# Patient Record
Sex: Male | Born: 1975 | Race: White | Hispanic: No | Smoking: Former smoker
Health system: Southern US, Community
[De-identification: ages and names within clinical notes are randomized; demographics above are authoritative.]

## PROBLEM LIST (undated history)

## (undated) DIAGNOSIS — K7031 Alcoholic cirrhosis of liver with ascites: Secondary | ICD-10-CM

## (undated) DIAGNOSIS — I10 Essential (primary) hypertension: Secondary | ICD-10-CM

## (undated) DIAGNOSIS — F1010 Alcohol abuse, uncomplicated: Secondary | ICD-10-CM

## (undated) DIAGNOSIS — E8801 Alpha-1-antitrypsin deficiency: Secondary | ICD-10-CM

## (undated) DIAGNOSIS — I8500 Esophageal varices without bleeding: Secondary | ICD-10-CM

## (undated) DIAGNOSIS — K7460 Unspecified cirrhosis of liver: Secondary | ICD-10-CM

---

## 2021-04-12 ENCOUNTER — Emergency Department: Payer: 59

## 2021-04-12 ENCOUNTER — Encounter: Payer: Self-pay | Admitting: Emergency Medicine

## 2021-04-12 ENCOUNTER — Inpatient Hospital Stay
Admission: EM | Admit: 2021-04-12 | Discharge: 2021-04-15 | DRG: 432 | Disposition: A | Discharge status: Home / Self Care | Payer: 59 | Attending: Internal Medicine | Admitting: Internal Medicine

## 2021-04-12 ENCOUNTER — Other Ambulatory Visit: Payer: Self-pay

## 2021-04-12 DIAGNOSIS — Z823 Family history of stroke: Secondary | ICD-10-CM | POA: Diagnosis not present

## 2021-04-12 DIAGNOSIS — U071 COVID-19: Secondary | ICD-10-CM | POA: Diagnosis present

## 2021-04-12 DIAGNOSIS — I10 Essential (primary) hypertension: Secondary | ICD-10-CM | POA: Diagnosis present

## 2021-04-12 DIAGNOSIS — K769 Liver disease, unspecified: Secondary | ICD-10-CM | POA: Diagnosis not present

## 2021-04-12 DIAGNOSIS — K7031 Alcoholic cirrhosis of liver with ascites: Secondary | ICD-10-CM | POA: Diagnosis present

## 2021-04-12 DIAGNOSIS — R0989 Other specified symptoms and signs involving the circulatory and respiratory systems: Secondary | ICD-10-CM

## 2021-04-12 DIAGNOSIS — R140 Abdominal distension (gaseous): Secondary | ICD-10-CM | POA: Diagnosis present

## 2021-04-12 DIAGNOSIS — K7040 Alcoholic hepatic failure without coma: Secondary | ICD-10-CM | POA: Diagnosis present

## 2021-04-12 DIAGNOSIS — K7460 Unspecified cirrhosis of liver: Secondary | ICD-10-CM | POA: Diagnosis present

## 2021-04-12 DIAGNOSIS — K766 Portal hypertension: Secondary | ICD-10-CM | POA: Diagnosis present

## 2021-04-12 DIAGNOSIS — Z87891 Personal history of nicotine dependence: Secondary | ICD-10-CM

## 2021-04-12 DIAGNOSIS — Z8249 Family history of ischemic heart disease and other diseases of the circulatory system: Secondary | ICD-10-CM | POA: Diagnosis not present

## 2021-04-12 DIAGNOSIS — E561 Deficiency of vitamin K: Secondary | ICD-10-CM | POA: Diagnosis present

## 2021-04-12 DIAGNOSIS — R188 Other ascites: Secondary | ICD-10-CM

## 2021-04-12 DIAGNOSIS — E871 Hypo-osmolality and hyponatremia: Secondary | ICD-10-CM | POA: Diagnosis present

## 2021-04-12 DIAGNOSIS — K7200 Acute and subacute hepatic failure without coma: Secondary | ICD-10-CM | POA: Diagnosis not present

## 2021-04-12 DIAGNOSIS — R000 Tachycardia, unspecified: Secondary | ICD-10-CM | POA: Diagnosis present

## 2021-04-12 DIAGNOSIS — D72829 Elevated white blood cell count, unspecified: Secondary | ICD-10-CM | POA: Diagnosis present

## 2021-04-12 DIAGNOSIS — Z8616 Personal history of COVID-19: Secondary | ICD-10-CM

## 2021-04-12 DIAGNOSIS — E876 Hypokalemia: Secondary | ICD-10-CM | POA: Diagnosis present

## 2021-04-12 DIAGNOSIS — R160 Hepatomegaly, not elsewhere classified: Secondary | ICD-10-CM | POA: Diagnosis not present

## 2021-04-12 DIAGNOSIS — F1020 Alcohol dependence, uncomplicated: Secondary | ICD-10-CM | POA: Diagnosis present

## 2021-04-12 DIAGNOSIS — R7989 Other specified abnormal findings of blood chemistry: Secondary | ICD-10-CM | POA: Diagnosis present

## 2021-04-12 DIAGNOSIS — Z8619 Personal history of other infectious and parasitic diseases: Secondary | ICD-10-CM | POA: Diagnosis not present

## 2021-04-12 DIAGNOSIS — Z833 Family history of diabetes mellitus: Secondary | ICD-10-CM

## 2021-04-12 DIAGNOSIS — I160 Hypertensive urgency: Secondary | ICD-10-CM | POA: Diagnosis present

## 2021-04-12 DIAGNOSIS — D689 Coagulation defect, unspecified: Secondary | ICD-10-CM | POA: Diagnosis present

## 2021-04-12 HISTORY — DX: Essential (primary) hypertension: I10

## 2021-04-12 LAB — PROTIME-INR
INR: 1.5 — ABNORMAL HIGH (ref 0.8–1.2)
Prothrombin Time: 17.9 seconds — ABNORMAL HIGH (ref 11.4–15.2)

## 2021-04-12 LAB — COMPREHENSIVE METABOLIC PANEL
ALT: 78 U/L — ABNORMAL HIGH (ref 0–44)
AST: 145 U/L — ABNORMAL HIGH (ref 15–41)
Albumin: 3.2 g/dL — ABNORMAL LOW (ref 3.5–5.0)
Alkaline Phosphatase: 207 U/L — ABNORMAL HIGH (ref 38–126)
Anion gap: 9 (ref 5–15)
BUN: 7 mg/dL (ref 6–20)
CO2: 24 mmol/L (ref 22–32)
Calcium: 8.6 mg/dL — ABNORMAL LOW (ref 8.9–10.3)
Chloride: 98 mmol/L (ref 98–111)
Creatinine, Ser: 0.7 mg/dL (ref 0.61–1.24)
GFR, Estimated: >60 mL/min (ref >60)
Glucose, Bld: 110 mg/dL — ABNORMAL HIGH (ref 70–99)
Potassium: 3.4 mmol/L — ABNORMAL LOW (ref 3.5–5.1)
Sodium: 131 mmol/L — ABNORMAL LOW (ref 135–145)
Total Bilirubin: 7.1 mg/dL — ABNORMAL HIGH (ref 0.3–1.2)
Total Protein: 7.3 g/dL (ref 6.5–8.1)

## 2021-04-12 LAB — RESP PANEL BY RT-PCR (FLU A&B, COVID) ARPGX2
Influenza A by PCR: NEGATIVE
Influenza B by PCR: NEGATIVE
SARS Coronavirus 2 by RT PCR: POSITIVE — AB

## 2021-04-12 LAB — URINALYSIS, COMPLETE (UACMP) WITH MICROSCOPIC
Bacteria, UA: NONE SEEN
Bilirubin Urine: NEGATIVE
Glucose, UA: NEGATIVE mg/dL
Hgb urine dipstick: NEGATIVE
Ketones, ur: NEGATIVE mg/dL
Leukocytes,Ua: NEGATIVE
Nitrite: NEGATIVE
Protein, ur: NEGATIVE mg/dL
Specific Gravity, Urine: 1.009 (ref 1.005–1.030)
Squamous Epithelial / HPF: NONE SEEN (ref 0–5)
pH: 6 (ref 5.0–8.0)

## 2021-04-12 LAB — CBC
HCT: 41.2 % (ref 39.0–52.0)
Hemoglobin: 14.8 g/dL (ref 13.0–17.0)
MCH: 34.6 pg — ABNORMAL HIGH (ref 26.0–34.0)
MCHC: 35.9 g/dL (ref 30.0–36.0)
MCV: 96.3 fL (ref 80.0–100.0)
Platelets: 171 10*3/uL (ref 150–400)
RBC: 4.28 MIL/uL (ref 4.22–5.81)
RDW: 15.7 % — ABNORMAL HIGH (ref 11.5–15.5)
WBC: 11.3 10*3/uL — ABNORMAL HIGH (ref 4.0–10.5)
nRBC: 0 % (ref 0.0–0.2)

## 2021-04-12 LAB — LIPASE, BLOOD: Lipase: 60 U/L — ABNORMAL HIGH (ref 11–51)

## 2021-04-12 LAB — APTT: aPTT: 38 seconds — ABNORMAL HIGH (ref 24–36)

## 2021-04-12 IMAGING — CR DG CHEST 2V
1 series · 2 of 2 positions shown · non-contrast
Comparison: None

CLINICAL DATA: Abdominal distension and swelling, ankle swelling,
shortness of breath

EXAM:
CHEST - 2 VIEW

[Series 1: dg chest 2 view · 0.14mm/px · 2 of 2 slices shown]
[im 1/2]
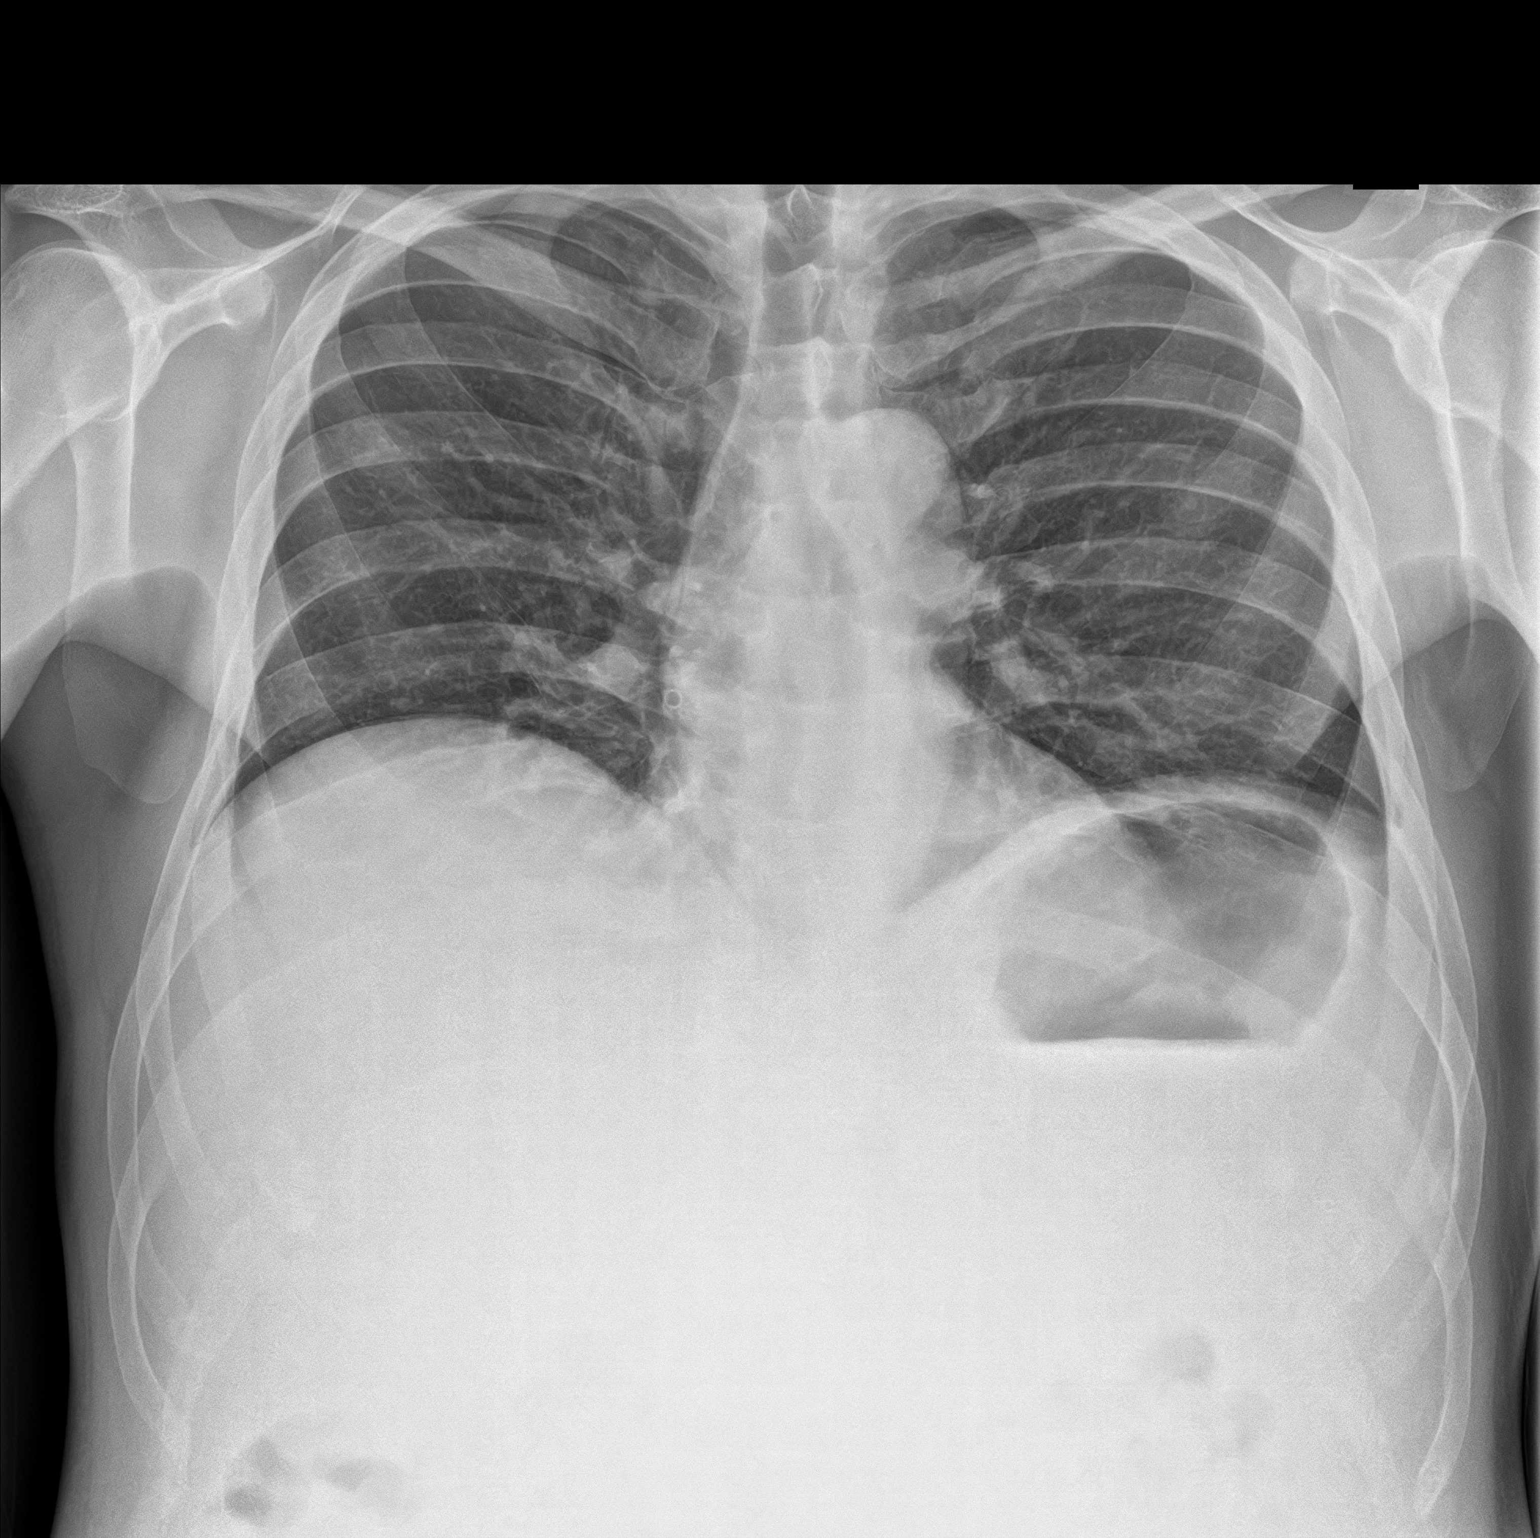
[im 2/2]
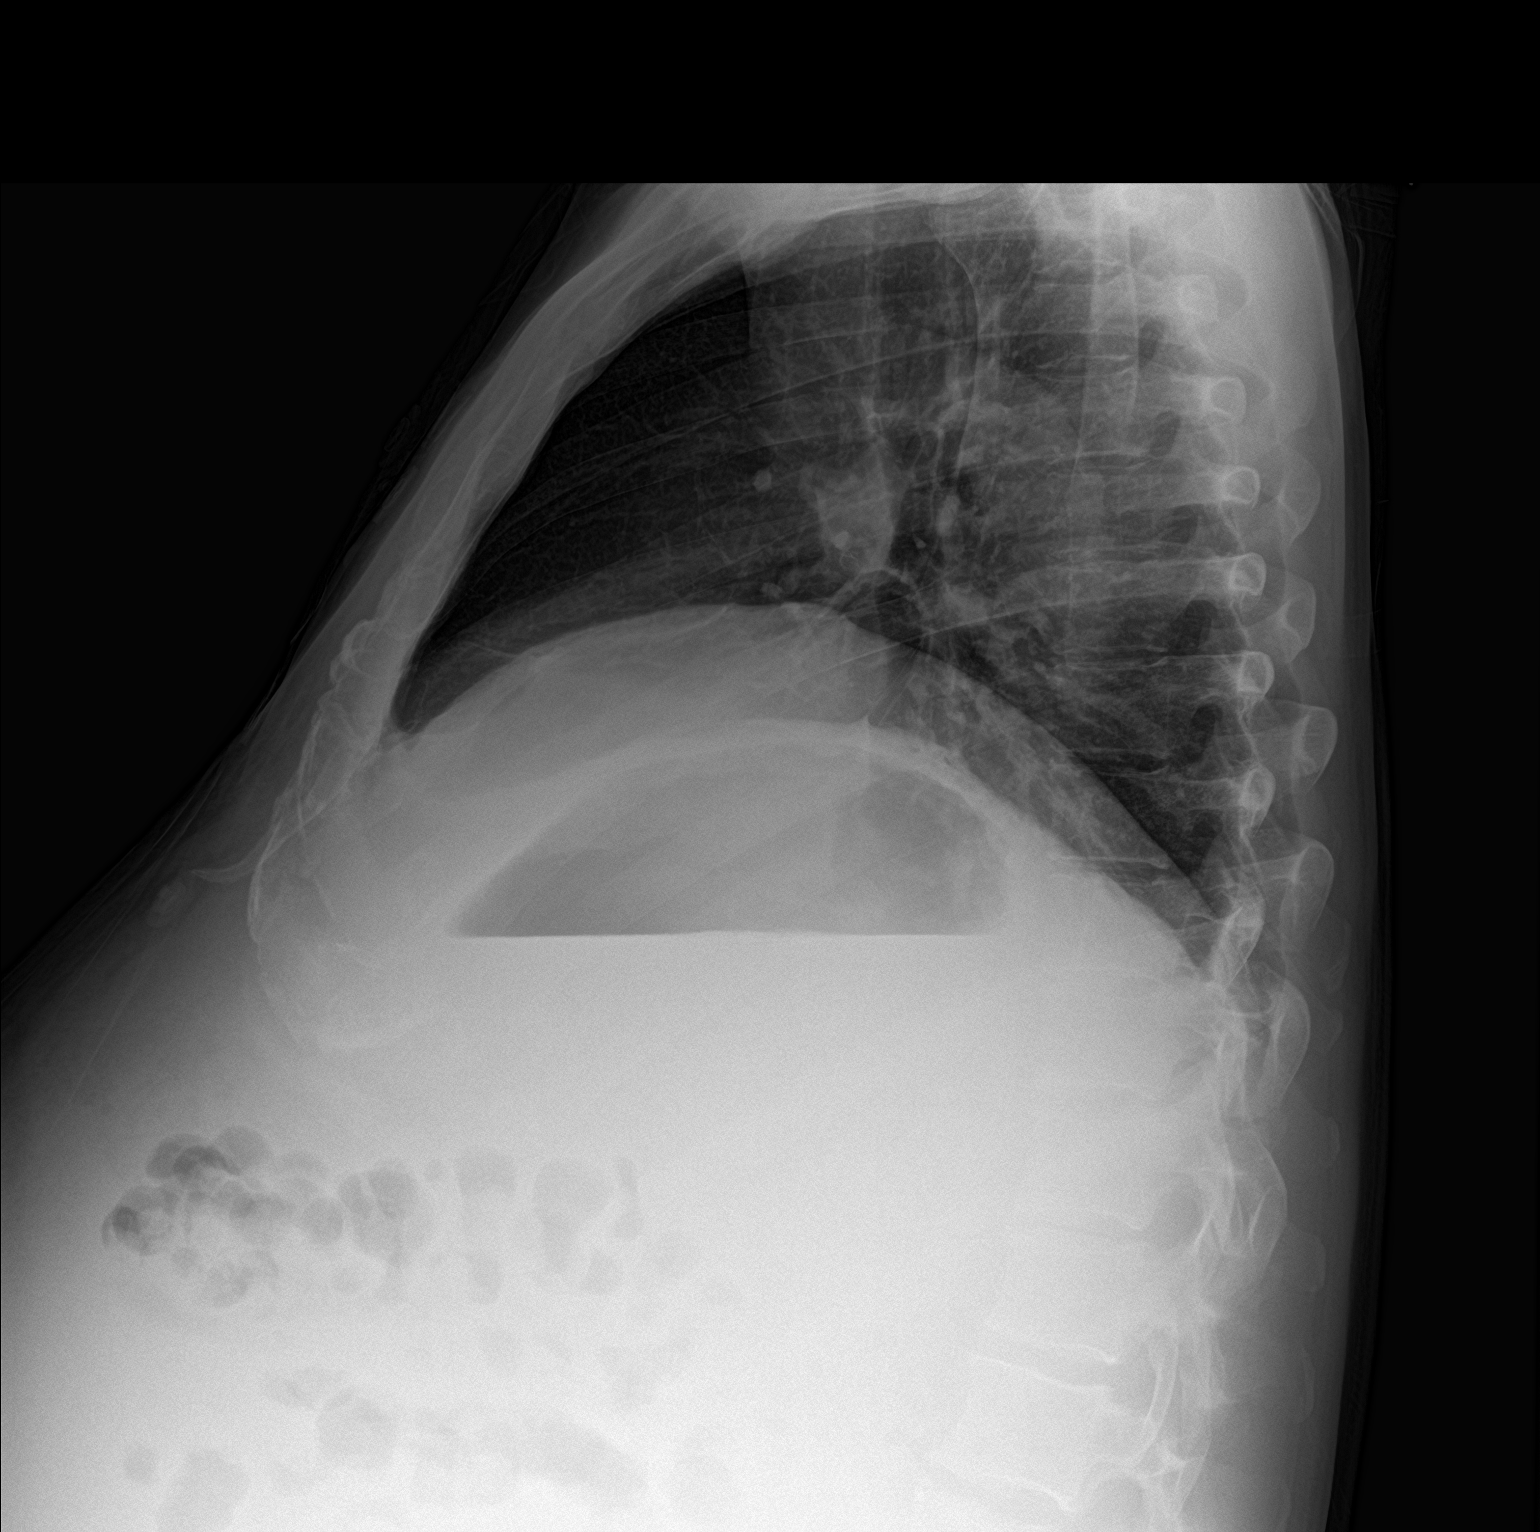

[2 of 2 positions shown; findings below may reference images not displayed]

FINDINGS: Normal heart size, mediastinal contours, and pulmonary vascularity.

Mild bibasilar atelectasis and decreased lung volumes.

Remaining lungs clear.

No acute infiltrate, pleural effusion, or pneumothorax.

Osseous structures unremarkable.
IMPRESSION: Decreased lung volumes with bibasilar atelectasis.

## 2021-04-12 IMAGING — US US ABDOMEN LIMITED
1 series · 14 of 25 positions shown · non-contrast
Comparison: None.

CLINICAL DATA: Abdominal distension, ascites

EXAM:
ULTRASOUND ABDOMEN LIMITED RIGHT UPPER QUADRANT

[Series 1: us abdomen limited ruq (liver/gb) · 14 of 117 slices shown]
[im 1/117]
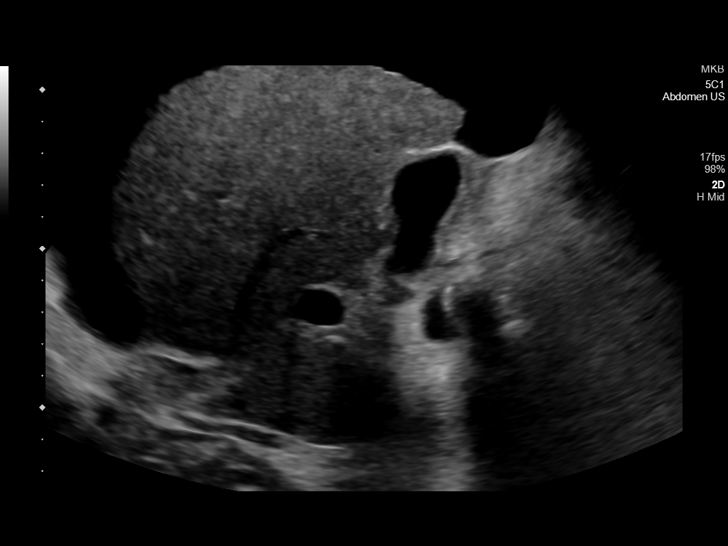
[im 10/117]
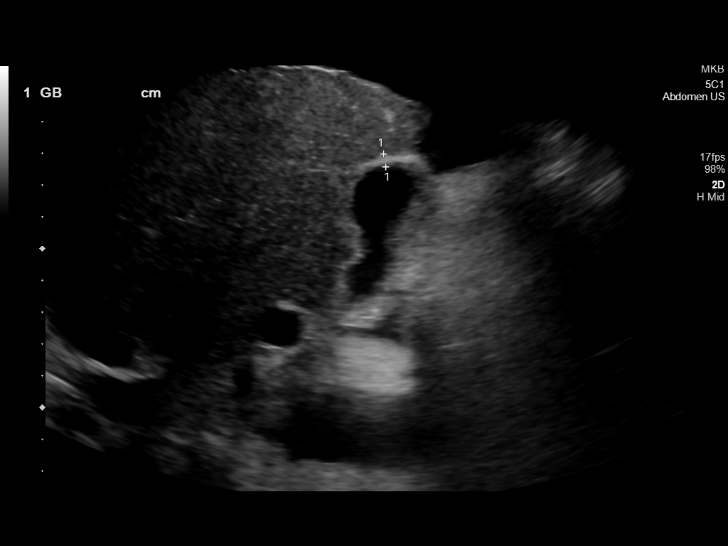
[im 20/117]
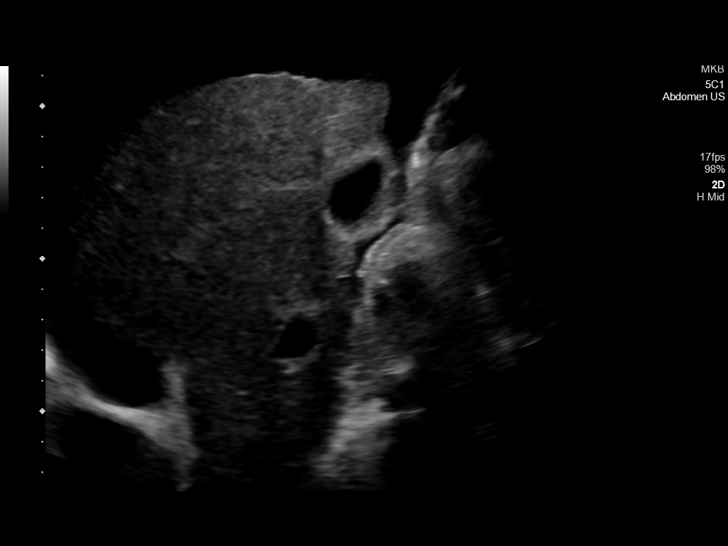
[im 30/117]
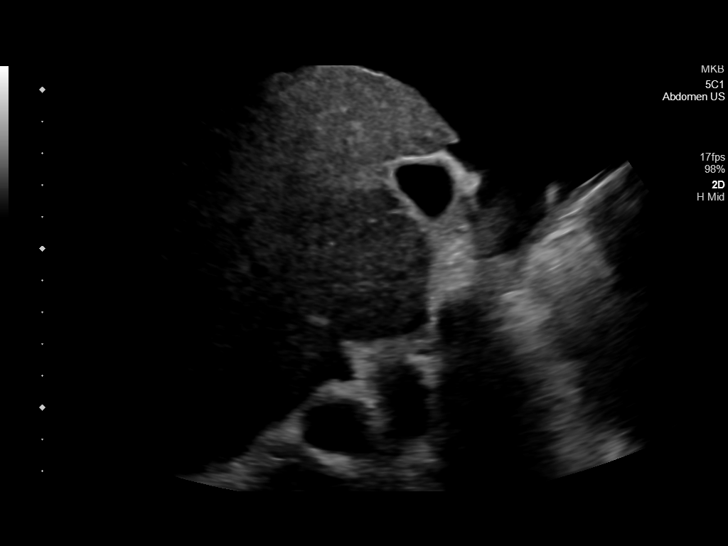
[im 39/117]
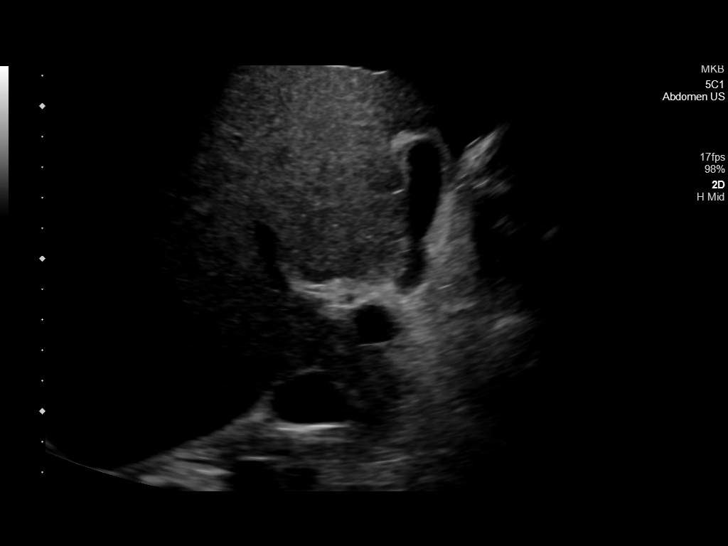
[im 44/117]
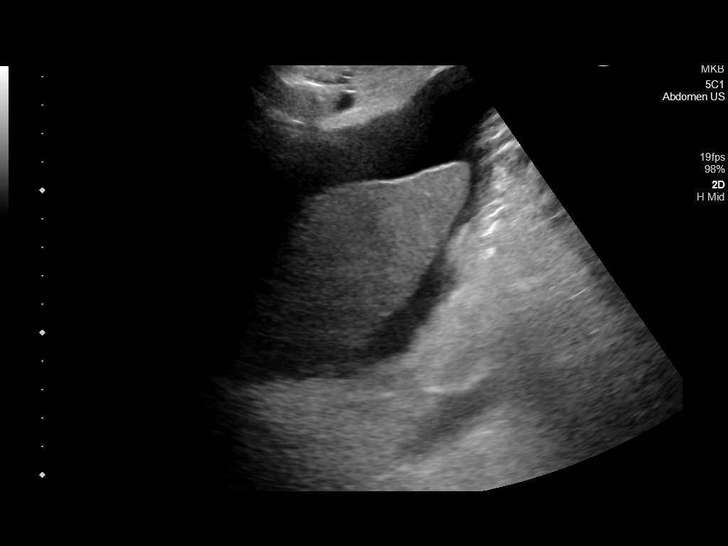
[im 54/117]
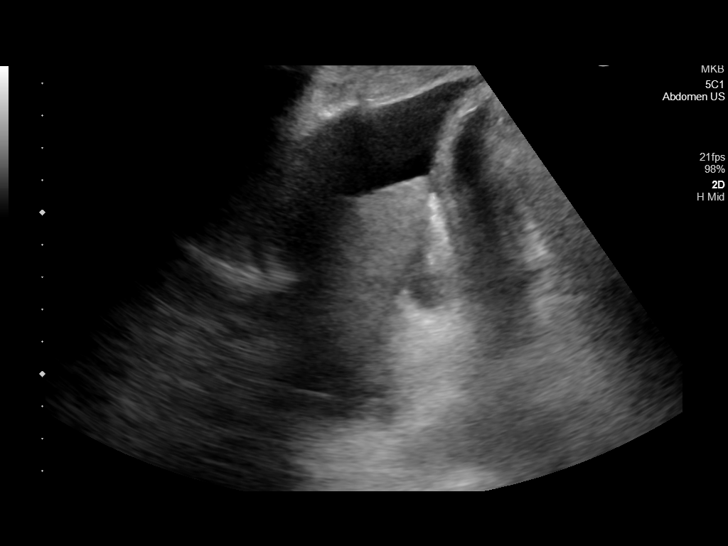
[im 63/117]
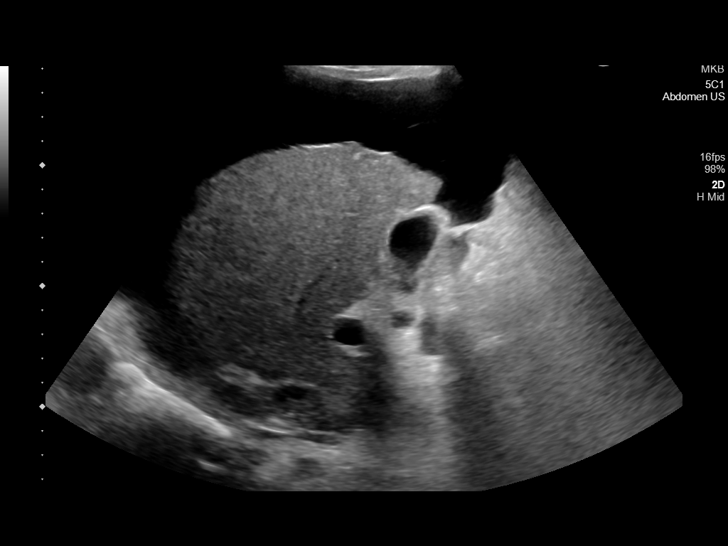
[im 73/117]
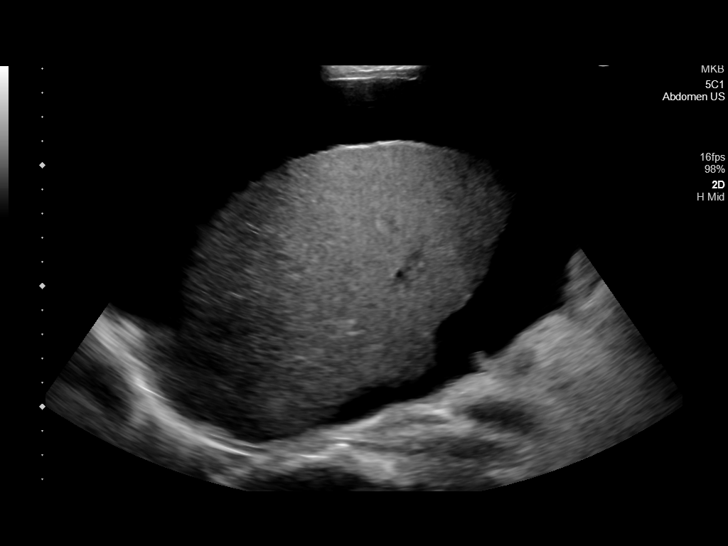
[im 78/117]
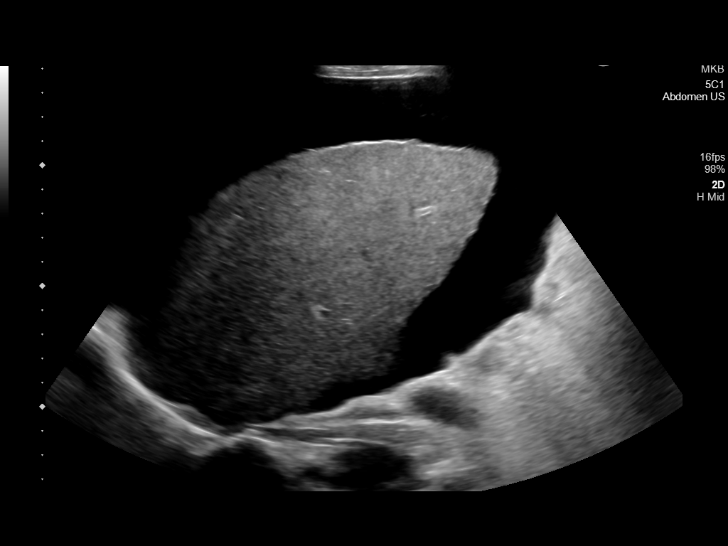
[im 88/117]
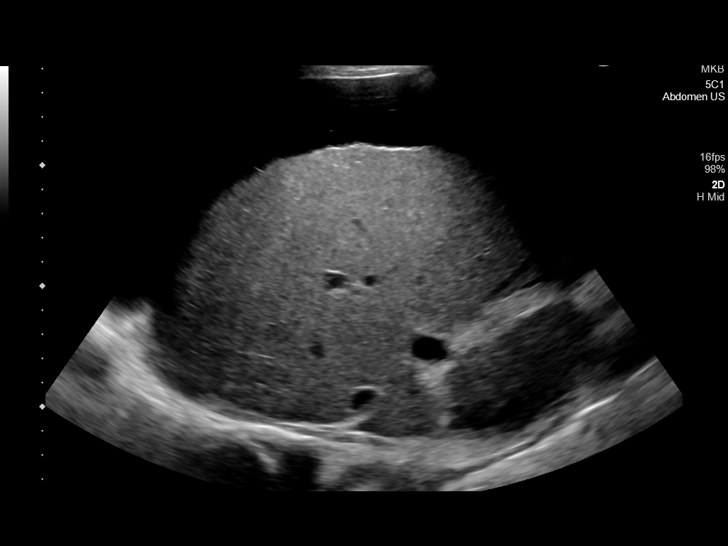
[im 97/117]
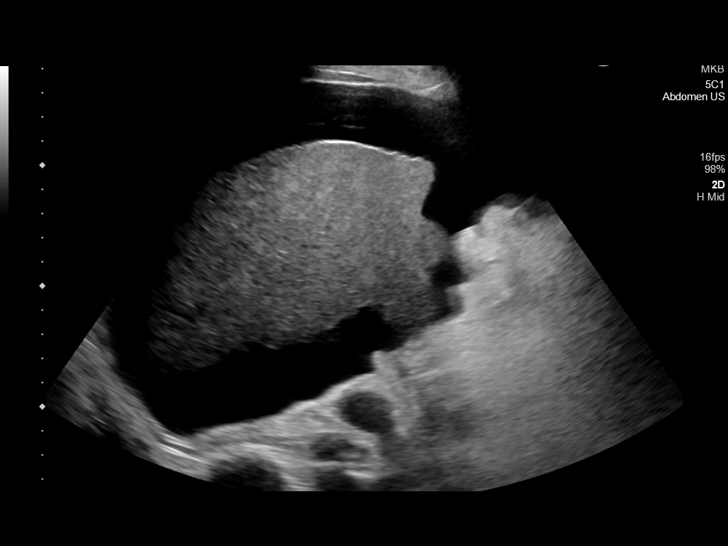
[im 107/117]
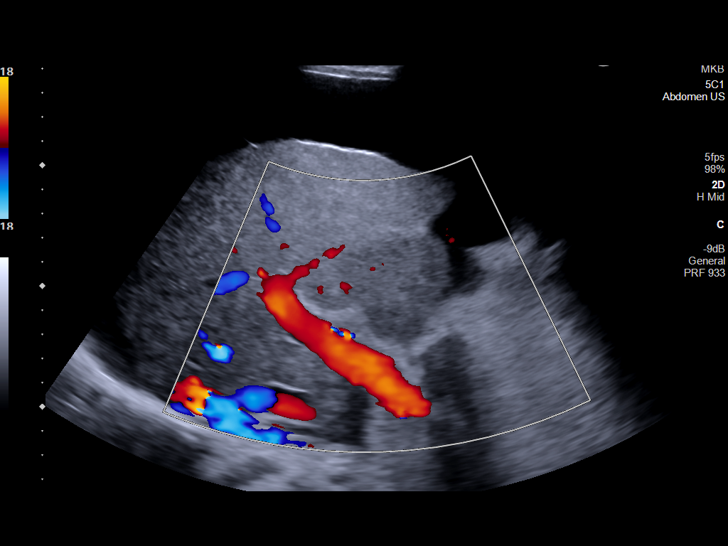
[im 117/117]
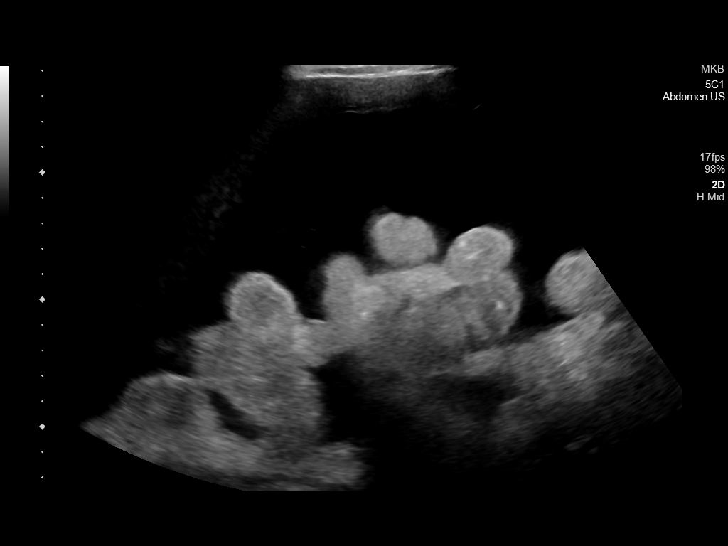

[14 of 25 positions shown; findings below may reference images not displayed]

FINDINGS: Gallbladder:

No evidence of shadowing gallstones or gallbladder sludge.
Nonspecific gallbladder wall thickening measuring 4 mm likely due to
adjacent ascites. Negative sonographic Murphy sign.

Common bile duct:

Diameter: 3 mm

Liver:

The liver echotexture is coarsened, with nodularity of the liver
capsule, consistent with cirrhosis. Within the region of the caudate
lobe there is a 4.9 by 4.8 by 3.9 cm slightly hypoechoic area, which
could reflect underlying mass. Dedicated nonemergent follow-up liver
MRI recommended for further characterization. Portal vein is patent
on color Doppler imaging with normal direction of blood flow towards
the liver.

Other: Large volume ascites identified throughout the abdomen.
IMPRESSION: 1. Cirrhosis, with 4.9 cm masslike hypoechoic area in the caudate
lobe. Underlying neoplasm cannot be excluded, and dedicated
nonemergent liver MRI recommended for follow-up.
2. Large volume ascites.

## 2021-04-12 MED ORDER — MAGNESIUM HYDROXIDE 400 MG/5ML PO SUSP
30.0000 mL | Freq: Every day | ORAL | Status: DC | PRN
  Filled 2021-04-12: qty 30

## 2021-04-12 MED ORDER — THIAMINE HCL 100 MG PO TABS
100.0000 mg | ORAL_TABLET | Freq: Every day | ORAL | Status: DC
  Administered 2021-04-13 – 2021-04-15 (×3): 100 mg via ORAL
  Filled 2021-04-12 (×3): qty 1

## 2021-04-12 MED ORDER — SODIUM CHLORIDE 0.9 % IV SOLN: 100.0000 mg | Freq: Every day | INTRAVENOUS | Status: DC

## 2021-04-12 MED ORDER — ADULT MULTIVITAMIN W/MINERALS CH
1.0000 | ORAL_TABLET | Freq: Every day | ORAL | Status: DC
  Administered 2021-04-13 – 2021-04-15 (×3): 1 via ORAL
  Filled 2021-04-12 (×3): qty 1

## 2021-04-12 MED ORDER — FOLIC ACID 1 MG PO TABS
1.0000 mg | ORAL_TABLET | Freq: Every day | ORAL | Status: DC
  Administered 2021-04-13 – 2021-04-15 (×3): 1 mg via ORAL
  Filled 2021-04-12 (×3): qty 1

## 2021-04-12 MED ORDER — ONDANSETRON HCL 4 MG PO TABS: 4.0000 mg | ORAL_TABLET | Freq: Four times a day (QID) | ORAL | Status: DC | PRN

## 2021-04-12 MED ORDER — ZINC SULFATE 220 (50 ZN) MG PO CAPS
220.0000 mg | ORAL_CAPSULE | Freq: Every day | ORAL | Status: DC
  Administered 2021-04-13 – 2021-04-15 (×3): 220 mg via ORAL
  Filled 2021-04-12 (×3): qty 1

## 2021-04-12 MED ORDER — SODIUM CHLORIDE 0.9 % IV SOLN: INTRAVENOUS | Status: DC

## 2021-04-12 MED ORDER — ASCORBIC ACID 500 MG PO TABS
500.0000 mg | ORAL_TABLET | Freq: Every day | ORAL | Status: DC
  Administered 2021-04-13 – 2021-04-15 (×3): 500 mg via ORAL
  Filled 2021-04-12 (×3): qty 1

## 2021-04-12 MED ORDER — ACETAMINOPHEN 325 MG PO TABS: 650.0000 mg | ORAL_TABLET | Freq: Four times a day (QID) | ORAL | Status: DC | PRN

## 2021-04-12 MED ORDER — SODIUM CHLORIDE 0.9 % IV SOLN: 200.0000 mg | Freq: Once | INTRAVENOUS | Status: DC

## 2021-04-12 MED ORDER — SODIUM CHLORIDE 0.9 % IV SOLN
100.0000 mg | Freq: Once | INTRAVENOUS | Status: AC
  Administered 2021-04-13: 02:00:00 100 mg via INTRAVENOUS
  Filled 2021-04-12: qty 20

## 2021-04-12 MED ORDER — ACETAMINOPHEN 650 MG RE SUPP: 650.0000 mg | Freq: Four times a day (QID) | RECTAL | Status: DC | PRN

## 2021-04-12 MED ORDER — TRAZODONE HCL 50 MG PO TABS: 25.0000 mg | ORAL_TABLET | Freq: Every evening | ORAL | Status: DC | PRN

## 2021-04-12 MED ORDER — SODIUM CHLORIDE 0.9 % IV SOLN
100.0000 mg | Freq: Once | INTRAVENOUS | Status: AC
  Administered 2021-04-13: 100 mg via INTRAVENOUS
  Filled 2021-04-12: qty 20

## 2021-04-12 MED ORDER — ONDANSETRON HCL 4 MG/2ML IJ SOLN: 4.0000 mg | Freq: Four times a day (QID) | INTRAMUSCULAR | Status: DC | PRN

## 2021-04-12 NOTE — ED Notes (Signed)
Pt has arrived to room 102. NT is waiting on nurse to come to room.

## 2021-04-12 NOTE — ED Triage Notes (Addendum)
Pt to ED via POV with c/o abd distention/swelling and swelling to bilateral ankles x 2 weeks. Pt states abd is super hard "like a snare drum". Pt with noted abd distention, slight jaundice noted as well.

## 2021-04-12 NOTE — ED Notes (Signed)
Patient made aware of positive COVID test. Sister is at bedside, provided with PPE.

## 2021-04-12 NOTE — H&P (Signed)
[facility]   PATIENT NAME: Brian Vasquez    MR#:  [id number]  DATE OF BIRTH:  Mar 02, 1976  DATE OF ADMISSION:  04/12/2021  PRIMARY CARE PHYSICIAN: Ellene Route   Patient is coming from: Home  REQUESTING/REFERRING PHYSICIAN: Merlyn Lot, MD  CHIEF COMPLAINT:   Chief Complaint  Patient presents with  . Abdominal Pain  . Fatigue    HISTORY OF PRESENT ILLNESS:  Brian Vasquez is a 45 y.o. male with medical history significant for essential hypertension and ongoing heavy alcohol abuse, presented to the emergency room with acute onset of worsening generalized abdominal distention and discomfort with associated fatigue and generalized weakness over the last couple of weeks.  The patient has been drinking heavily (2 glasses of whiskey every day) for the last 5 years after his divorce.  He denied any previous liver disease prior to that.  He has not had any alcoholic beverage for the last couple weeks.  He admitted to weight gain of about 20 pounds over the last couple of months and significant lower extremity edema.  And dyspnea on exertion without chest pain or cough or wheezing.  No dysuria, oliguria, urinary frequency or urgency or flank pain.  The patient recently moved from [place].  He apparently ran out of his HCTZ that he was taking for his hypertension.  He had 2 vaccines and a booster for COVID-19.  He did not notice any skin discoloration.  ED Course: When he came to the ER blood pressure was 156/118 with a heart rate of 118 and otherwise normal vital signs.  Labs revealed mild hyponatremia of 131 and hypokalemia of 3.4.  LFTs were remarkable for alk phos of 207, albumin 3.2, AST 145 ALT of 78 total protein 7.3 and total bili 7.1.  CBC showed mild leukocytosis of 11.3 and his platelets were 171.  INR was 1.5 and PT 17.9.  PTT was 38.  Influenza antigens came back negative.  COVID-19 PCR came back positive.  UA was unremarkable.    EKG as reviewed by me :   Showed sinus tachycardia with rate 115 with poor R wave progression. Imaging: Chest x-ray showed decreased lung volumes with bibasal atelectasis. Abdominal ultrasound showed the following: 1. Cirrhosis, with 4.9 cm masslike hypoechoic area in the caudate lobe. Underlying neoplasm cannot be excluded, and dedicated nonemergent liver MRI recommended for follow-up. 2. Large volume ascites.  The patient will be admitted to a medical monitored bed for further evaluation and management.  PAST MEDICAL HISTORY:   Past Medical History:  Diagnosis Date  . Hypertension     PAST SURGICAL HISTORY:  History reviewed. No pertinent surgical history.  He denies any previous surgeries.  SOCIAL HISTORY:   Social History   Tobacco Use  . Smoking status: Former    Types: Cigarettes  . Smokeless tobacco: Not on file  Substance Use Topics  . Alcohol use: Yes    Comment: Daily- 2 liquor drinks/day    FAMILY HISTORY:  Positive for diabetes mellitus, hypertension, CVA, cancer and coronary artery disease DRUG ALLERGIES:  No Known Allergies  REVIEW OF SYSTEMS:   ROS As per history of present illness. All pertinent systems were reviewed above. Constitutional, HEENT, cardiovascular, respiratory, GI, GU, musculoskeletal, neuro, psychiatric, endocrine, integumentary and hematologic systems were reviewed and are otherwise negative/unremarkable except for positive findings mentioned above in the HPI.   MEDICATIONS AT HOME:   Prior to Admission medications   Not on File  VITAL SIGNS:  Blood pressure (!) 156/108, pulse 81, temperature 98.9 F (37.2 C), temperature source Oral, resp. rate 15, SpO2 96 %.  PHYSICAL EXAMINATION:  Physical Exam  GENERAL:  45 y.o.-year-old patient lying in the bed with no acute distress.  He had minimal conversational dyspnea. EYES: Pupils equal, round, reactive to light and accommodation.  Positive scleral icterus. Extraocular muscles intact.  HEENT: Head  atraumatic, normocephalic. Oropharynx and nasopharynx clear.  NECK:  Supple, no jugular venous distention. No thyroid enlargement, no tenderness.  LUNGS: Normal breath sounds bilaterally, no wheezing, rales,rhonchi or crepitation. No use of accessory muscles of respiration.  CARDIOVASCULAR: Regular rate and rhythm, S1, S2 normal. No murmurs, rubs, or gallops.  ABDOMEN: Soft, remarkably distended with minimal generalized tenderness without rebound tenderness guarding or rigidity.  He had positive shifting dullness.  Bowel sounds present.  I cannot feel organomegaly or masses due to significant ascites.  EXTREMITIES: 2-3+ bilateral lower extremity pitting edema with no cyanosis, or clubbing.  NEUROLOGIC: Cranial nerves II through XII are intact. Muscle strength 5/5 in all extremities. Sensation intact. Gait not checked.  PSYCHIATRIC: The patient is alert and oriented x 3.  Normal affect and good eye contact. SKIN: No obvious rash, lesion, or ulcer.   LABORATORY PANEL:   CBC Recent Labs  Lab 04/12/21 1947  WBC 11.3*  HGB 14.8  HCT 41.2  PLT 171   ------------------------------------------------------------------------------------------------------------------  Chemistries  Recent Labs  Lab 04/12/21 1947  NA 131*  K 3.4*  CL 98  CO2 24  GLUCOSE 110*  BUN 7  CREATININE 0.70  CALCIUM 8.6*  AST 145*  ALT 78*  ALKPHOS 207*  BILITOT 7.1*   ------------------------------------------------------------------------------------------------------------------  Cardiac Enzymes No results for input(s): TROPONINI in the last 168 hours. ------------------------------------------------------------------------------------------------------------------  RADIOLOGY:  DG Chest 2 View  Result Date: 04/12/2021 CLINICAL DATA:  Abdominal distension and swelling, ankle swelling, shortness of breath EXAM: CHEST - 2 VIEW COMPARISON:  None FINDINGS: Normal heart size, mediastinal contours, and  pulmonary vascularity. Mild bibasilar atelectasis and decreased lung volumes. Remaining lungs clear. No acute infiltrate, pleural effusion, or pneumothorax. Osseous structures unremarkable. IMPRESSION: Decreased lung volumes with bibasilar atelectasis. Electronically Signed   By: Lavonia Dana M.D.   On: 04/12/2021 20:12   US ABDOMEN LIMITED RUQ (LIVER/GB)  Result Date: 04/12/2021 CLINICAL DATA:  Abdominal distension, ascites EXAM: ULTRASOUND ABDOMEN LIMITED RIGHT UPPER QUADRANT COMPARISON:  None. FINDINGS: Gallbladder: No evidence of shadowing gallstones or gallbladder sludge. Nonspecific gallbladder wall thickening measuring 4 mm likely due to adjacent ascites. Negative sonographic Murphy sign. Common bile duct: Diameter: 3 mm Liver: The liver echotexture is coarsened, with nodularity of the liver capsule, consistent with cirrhosis. Within the region of the caudate lobe there is a 4.9 by 4.8 by 3.9 cm slightly hypoechoic area, which could reflect underlying mass. Dedicated nonemergent follow-up liver MRI recommended for further characterization. Portal vein is patent on color Doppler imaging with normal direction of blood flow towards the liver. Other: Large volume ascites identified throughout the abdomen. IMPRESSION: 1. Cirrhosis, with 4.9 cm masslike hypoechoic area in the caudate lobe. Underlying neoplasm cannot be excluded, and dedicated nonemergent liver MRI recommended for follow-up. 2. Large volume ascites. Electronically Signed   By: Randa Ngo M.D.   On: 04/12/2021 22:11      IMPRESSION AND PLAN:  Active Problems:   Liver cirrhosis ([place])  1.  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 - The patient will be admitted to a medical monitored bed. -  We will follow as LFTs. - We will obtain a GI consultation. - I notified Dr. Vicente Males about the patient.  2.  Large volume ascites. - IR consult to be obtained for paracentesis in AM. - Paracentesis labs were ordered.  3.   Liver mass measuring about 4.9 cm. - We will obtain a dedicated MRI of the abdomen for further assessment. - We will defer further work-up to Dr. Vicente Males.  4.  Hypertensive urgency. - We will start him on p.o. Norvasc. - He will be placed on as needed IV labetalol.  5.  Incidental COVID-19 infection. - The patient will be placed on IV remdesivir. - We will add vitamin C, vitamin D3 and zinc sulfate. - We will follow inflammatory markers.  DVT prophylaxis: SCDs.  Medical prophylaxis currently contraindicated due to coagulopathy. Code Status: full code. Family Communication:  The plan of care was discussed in details with the patient (and family). I answered all questions. The patient agreed to proceed with the above mentioned plan. Further management will depend upon hospital course. Disposition Plan: Back to previous home environment Consults called: Gastroenterology consult.  All the records are reviewed and case discussed with ED provider.  Status is: Inpatient  Remains inpatient appropriate because:Ongoing active pain requiring inpatient pain management, Ongoing diagnostic testing needed not appropriate for outpatient work up, Unsafe d/c plan, IV treatments appropriate due to intensity of illness or inability to take PO, and Inpatient level of care appropriate due to severity of illness  Dispo: The patient is from: Home              Anticipated d/c is to: Home              Patient currently is not medically stable to d/c.   Difficult to place patient No  TOTAL TIME TAKING CARE OF THIS PATIENT: 55 minutes.    Christel Mormon M.D on 04/12/2021 at 10:53 PM  [facility]   From 7 PM-7 AM, contact night-coverage [email or web address]  CC: Primary care physician; Ellene Route

## 2021-04-12 NOTE — ED Provider Notes (Signed)
[facility] Emergency Department Provider Note    Event Date/Time   First MD Initiated Contact with Patient 04/12/21 2035     (approximate)  I have reviewed the triage vital signs and the nursing notes.   HISTORY  Chief Complaint Abdominal Pain and Fatigue    HPI Brian Vasquez is a 45 y.o. male with a history of high blood pressure presents to the ER for evaluation abdominal distention and swelling has popped up in the past 2 weeks.  States that for the past 5 years after divorce he has been a very heavy drinker drinking a couple large for his whiskey nightly.  He denies any history of known liver troubles no history of heart troubles.  Started noticing that he started having swelling in his belly the past 2 weeks and weight gain.  States he has not been having any alcoholic beverages for the past 2 weeks.  Past Medical History:  Diagnosis Date   Hypertension    No family history on file. History reviewed. No pertinent surgical history. Patient Active Problem List   Diagnosis Date Noted   Liver cirrhosis ([place]) 04/12/2021       Prior to Admission medications   Not on File    Allergies Patient has no known allergies.    Social History Social History   Tobacco Use   Smoking status: Former    Types: Cigarettes  Substance Use Topics   Alcohol use: Yes    Comment: Daily- 2 liquor drinks/day   Drug use: Not Currently    Review of Systems Patient denies headaches, rhinorrhea, blurry vision, numbness, shortness of breath, chest pain, edema, cough, abdominal pain, nausea, vomiting, diarrhea, dysuria, fevers, rashes or hallucinations unless otherwise stated above in HPI. ____________________________________________   PHYSICAL EXAM:  VITAL SIGNS: Vitals:   04/12/21 2040 04/12/21 2143  BP: (!) 138/115 (!) 156/108  Pulse: (!) 105 81  Resp: 16 15  Temp:    SpO2: 99% 96%    Constitutional: Alert and oriented.  Eyes: icteric Head:  Atraumatic. Nose: No congestion/rhinnorhea. Mouth/Throat: Mucous membranes are moist.   Neck: No stridor. Painless ROM.  Cardiovascular: Normal rate, regular rhythm. Grossly normal heart sounds.  Good peripheral circulation. Respiratory: Normal respiratory effort.  No retractions. Lungs CTAB. Gastrointestinal:  + distention and fluid wave,  no pain. No abdominal bruits. No CVA tenderness. Genitourinary:  Musculoskeletal: No lower extremity tenderness, 1+ BLE edema.  No joint effusions. Neurologic:  Normal speech and language. No gross focal neurologic deficits are appreciated. No facial droop Skin:  Skin is warm, dry and intact. No rash noted. Psychiatric: Mood and affect are normal. Speech and behavior are normal.  ____________________________________________   LABS (all labs ordered are listed, but only abnormal results are displayed)  Results for orders placed or performed during the hospital encounter of 04/12/21 (from the past 24 hour(s))  Lipase, blood     Status: Abnormal   Collection Time: 04/12/21  7:47 PM  Result Value Ref Range   Lipase 60 (H) 11 - 51 U/L  Comprehensive metabolic panel     Status: Abnormal   Collection Time: 04/12/21  7:47 PM  Result Value Ref Range   Sodium 131 (L) 135 - 145 mmol/L   Potassium 3.4 (L) 3.5 - 5.1 mmol/L   Chloride 98 98 - 111 mmol/L   CO2 24 22 - 32 mmol/L   Glucose, Bld 110 (H) 70 - 99 mg/dL   BUN 7 6 - 20 mg/dL  Creatinine, Ser 0.70 0.61 - 1.24 mg/dL   Calcium 8.6 (L) 8.9 - 10.3 mg/dL   Total Protein 7.3 6.5 - 8.1 g/dL   Albumin 3.2 (L) 3.5 - 5.0 g/dL   AST 145 (H) 15 - 41 U/L   ALT 78 (H) 0 - 44 U/L   Alkaline Phosphatase 207 (H) 38 - 126 U/L   Total Bilirubin 7.1 (H) 0.3 - 1.2 mg/dL   GFR, Estimated >60 >60 mL/min   Anion gap 9 5 - 15  CBC     Status: Abnormal   Collection Time: 04/12/21  7:47 PM  Result Value Ref Range   WBC 11.3 (H) 4.0 - 10.5 K/uL   RBC 4.28 4.22 - 5.81 MIL/uL   Hemoglobin 14.8 13.0 - 17.0 g/dL   HCT  41.2 39.0 - 52.0 %   MCV 96.3 80.0 - 100.0 fL   MCH 34.6 (H) 26.0 - 34.0 pg   MCHC 35.9 30.0 - 36.0 g/dL   RDW 15.7 (H) 11.5 - 15.5 %   Platelets 171 150 - 400 K/uL   nRBC 0.0 0.0 - 0.2 %  Urinalysis, Complete w Microscopic     Status: Abnormal   Collection Time: 04/12/21  7:47 PM  Result Value Ref Range   Color, Urine YELLOW (A) YELLOW   APPearance CLEAR (A) CLEAR   Specific Gravity, Urine 1.009 1.005 - 1.030   pH 6.0 5.0 - 8.0   Glucose, UA NEGATIVE NEGATIVE mg/dL   Hgb urine dipstick NEGATIVE NEGATIVE   Bilirubin Urine NEGATIVE NEGATIVE   Ketones, ur NEGATIVE NEGATIVE mg/dL   Protein, ur NEGATIVE NEGATIVE mg/dL   Nitrite NEGATIVE NEGATIVE   Leukocytes,Ua NEGATIVE NEGATIVE   RBC / HPF 0-5 0 - 5 RBC/hpf   WBC, UA 0-5 0 - 5 WBC/hpf   Bacteria, UA NONE SEEN NONE SEEN   Squamous Epithelial / LPF NONE SEEN 0 - 5  Protime-INR     Status: Abnormal   Collection Time: 04/12/21  7:47 PM  Result Value Ref Range   Prothrombin Time 17.9 (H) 11.4 - 15.2 seconds   INR 1.5 (H) 0.8 - 1.2  APTT     Status: Abnormal   Collection Time: 04/12/21  7:47 PM  Result Value Ref Range   aPTT 38 (H) 24 - 36 seconds  Resp Panel by RT-PCR (Flu A&B, Covid) Nasopharyngeal Swab     Status: Abnormal   Collection Time: 04/12/21  8:58 PM   Specimen: Nasopharyngeal Swab; Nasopharyngeal(NP) swabs in vial transport medium  Result Value Ref Range   SARS Coronavirus 2 by RT PCR POSITIVE (A) NEGATIVE   Influenza A by PCR NEGATIVE NEGATIVE   Influenza B by PCR NEGATIVE NEGATIVE   ____________________________________________  EKG My review and personal interpretation at Time: 19:55   Indication: abd distension  Rate: 115  Rhythm: sinus Axis: normal Other: normal intervals, no stemi ____________________________________________  RADIOLOGY  I personally reviewed all radiographic images ordered to evaluate for the above acute complaints and reviewed radiology reports and findings.  These findings were  personally discussed with the patient.  Please see medical record for radiology report.  ____________________________________________   PROCEDURES  Procedure(s) performed:  Procedures    Critical Care performed: no ____________________________________________   INITIAL IMPRESSION / ASSESSMENT AND PLAN / ED COURSE  Pertinent labs & imaging results that were available during my care of the patient were reviewed by me and considered in my medical decision making (see chart for details).   DDX: cirrhosis, biliary  obstruction, sbo, sbp mass  Brian Vasquez is a 45 y.o. who presents to the ED with what appears to be new onset ascites likely secondary to alcohol abuse.  Does have mildly elevated INR as well as elevated bilirubin to 7.  No fevers.  No particular pain but he does have significant abdominal distention.  Denies any cough or congestion.  Incidentally found to be COVID-positive.  Right upper quadrant ultrasound that showed large ascites as well as possible liver mass.  As this is new onset ascites with likely cirrhosis with no PCP will admit to hospitalist for paracentesis GI consult.  Have discussed with the patient and available family all diagnostics and treatments performed thus far and all questions were answered to the best of my ability. The patient demonstrates understanding and agreement with plan.      The patient was evaluated in Emergency Department today for the symptoms described in the history of present illness. He/she was evaluated in the context of the global COVID-19 pandemic, which necessitated consideration that the patient might be at risk for infection with the SARS-CoV-2 virus that causes COVID-19. Institutional protocols and algorithms that pertain to the evaluation of patients at risk for COVID-19 are in a state of rapid change based on information released by regulatory bodies including the CDC and federal and state organizations. These policies and  algorithms were followed during the patient's care in the ED.  As part of my medical decision making, I reviewed the following data within the [place] notes reviewed and incorporated, Labs reviewed, notes from prior ED visits and [place] Controlled Substance Database   ____________________________________________   FINAL CLINICAL IMPRESSION(S) / ED DIAGNOSES  Final diagnoses:  Ascites  Ascites due to alcoholic cirrhosis ([place])      NEW MEDICATIONS STARTED DURING THIS VISIT:  New Prescriptions   No medications on file     Note:  This document was prepared using Dragon voice recognition software and may include unintentional dictation errors.    Merlyn Lot, MD 04/12/21 2251

## 2021-04-12 NOTE — ED Notes (Signed)
Patient c/o abdominal swelling and ankle swelling for ~ 2 weeks. Sclera is jaundiced. 3+ edema to bilateral lower extremities. Abdomen is very distended and firm.

## 2021-04-13 ENCOUNTER — Inpatient Hospital Stay: Payer: 59

## 2021-04-13 ENCOUNTER — Encounter: Payer: Self-pay | Admitting: Family Medicine

## 2021-04-13 DIAGNOSIS — K7031 Alcoholic cirrhosis of liver with ascites: Secondary | ICD-10-CM | POA: Diagnosis not present

## 2021-04-13 DIAGNOSIS — R0989 Other specified symptoms and signs involving the circulatory and respiratory systems: Secondary | ICD-10-CM

## 2021-04-13 DIAGNOSIS — K7200 Acute and subacute hepatic failure without coma: Secondary | ICD-10-CM | POA: Diagnosis not present

## 2021-04-13 LAB — CBC WITH DIFFERENTIAL/PLATELET
Abs Immature Granulocytes: 0.02 10*3/uL (ref 0.00–0.07)
Basophils Absolute: 0.1 10*3/uL (ref 0.0–0.1)
Basophils Relative: 1 %
Eosinophils Absolute: 0.3 10*3/uL (ref 0.0–0.5)
Eosinophils Relative: 3 %
HCT: 40.9 % (ref 39.0–52.0)
Hemoglobin: 14.5 g/dL (ref 13.0–17.0)
Immature Granulocytes: 0 %
Lymphocytes Relative: 21 %
Lymphs Abs: 1.9 10*3/uL (ref 0.7–4.0)
MCH: 34.3 pg — ABNORMAL HIGH (ref 26.0–34.0)
MCHC: 35.5 g/dL (ref 30.0–36.0)
MCV: 96.7 fL (ref 80.0–100.0)
Monocytes Absolute: 0.8 10*3/uL (ref 0.1–1.0)
Monocytes Relative: 8 %
Neutro Abs: 6.1 10*3/uL (ref 1.7–7.7)
Neutrophils Relative %: 67 %
Platelets: 146 10*3/uL — ABNORMAL LOW (ref 150–400)
RBC: 4.23 MIL/uL (ref 4.22–5.81)
RDW: 15.6 % — ABNORMAL HIGH (ref 11.5–15.5)
WBC: 9.1 10*3/uL (ref 4.0–10.5)
nRBC: 0 % (ref 0.0–0.2)

## 2021-04-13 LAB — COMPREHENSIVE METABOLIC PANEL
ALT: 77 U/L — ABNORMAL HIGH (ref 0–44)
AST: 134 U/L — ABNORMAL HIGH (ref 15–41)
Albumin: 2.8 g/dL — ABNORMAL LOW (ref 3.5–5.0)
Alkaline Phosphatase: 190 U/L — ABNORMAL HIGH (ref 38–126)
Anion gap: 6 (ref 5–15)
BUN: 7 mg/dL (ref 6–20)
CO2: 27 mmol/L (ref 22–32)
Calcium: 8.3 mg/dL — ABNORMAL LOW (ref 8.9–10.3)
Chloride: 101 mmol/L (ref 98–111)
Creatinine, Ser: 0.63 mg/dL (ref 0.61–1.24)
GFR, Estimated: >60 mL/min (ref >60)
Glucose, Bld: 89 mg/dL (ref 70–99)
Potassium: 3.1 mmol/L — ABNORMAL LOW (ref 3.5–5.1)
Sodium: 134 mmol/L — ABNORMAL LOW (ref 135–145)
Total Bilirubin: 6.4 mg/dL — ABNORMAL HIGH (ref 0.3–1.2)
Total Protein: 6.9 g/dL (ref 6.5–8.1)

## 2021-04-13 LAB — HEPATITIS A ANTIBODY, TOTAL: hep A Total Ab: REACTIVE — AB

## 2021-04-13 LAB — C-REACTIVE PROTEIN
CRP: 1.3 mg/dL — ABNORMAL HIGH (ref <1.0)
CRP: 1.3 mg/dL — ABNORMAL HIGH (ref <1.0)

## 2021-04-13 LAB — FERRITIN
Ferritin: 335 ng/mL (ref 24–336)
Ferritin: 327 ng/mL (ref 24–336)

## 2021-04-13 LAB — D-DIMER, QUANTITATIVE
D-Dimer, Quant: 7.9 ug/mL-FEU — ABNORMAL HIGH (ref 0.00–0.50)
D-Dimer, Quant: 7.42 ug/mL-FEU — ABNORMAL HIGH (ref 0.00–0.50)

## 2021-04-13 LAB — ACETAMINOPHEN LEVEL: Acetaminophen (Tylenol), Serum: <10 ug/mL — ABNORMAL LOW (ref 10–30)

## 2021-04-13 LAB — MAGNESIUM: Magnesium: 1.8 mg/dL (ref 1.7–2.4)

## 2021-04-13 LAB — HEPATITIS B SURFACE ANTIGEN: Hepatitis B Surface Ag: NONREACTIVE

## 2021-04-13 LAB — HEPATITIS B CORE ANTIBODY, TOTAL: Hep B Core Total Ab: REACTIVE — AB

## 2021-04-13 LAB — TROPONIN I (HIGH SENSITIVITY)
Troponin I (High Sensitivity): 7 ng/L (ref <18)
Troponin I (High Sensitivity): 6 ng/L (ref <18)

## 2021-04-13 LAB — HIV ANTIBODY (ROUTINE TESTING W REFLEX): HIV Screen 4th Generation wRfx: REACTIVE — AB

## 2021-04-13 LAB — LACTATE DEHYDROGENASE: LDH: 253 U/L — ABNORMAL HIGH (ref 98–192)

## 2021-04-13 IMAGING — US US EXTREM LOW VENOUS
1 series · 13 of 24 positions shown · non-contrast
Comparison: None.

CLINICAL DATA: 45-year-old male with a history of lower extremity
edema for a month



[Series 1: us venous img lower bilat (dvt) · portal-venous · 13 of 109 slices shown]
[im 1/109]
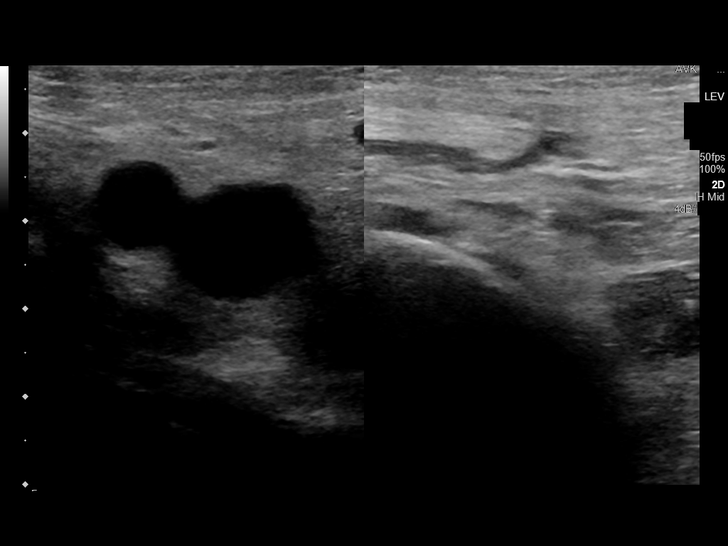
[im 10/109]
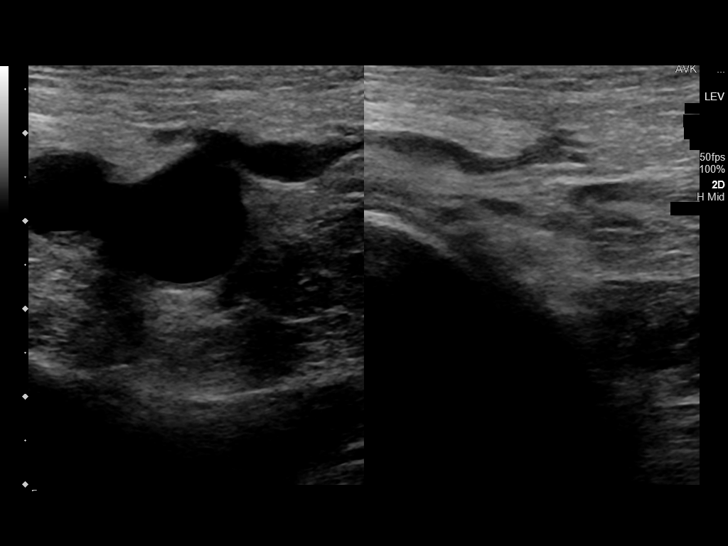
[im 19/109]
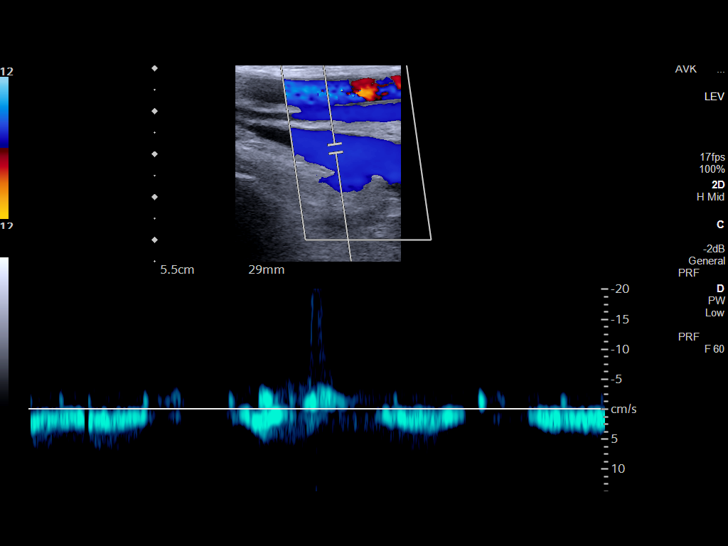
[im 29/109]
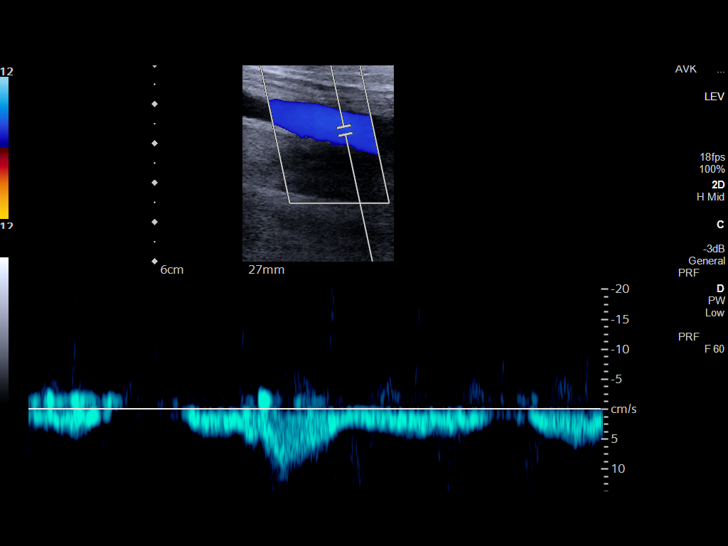
[im 38/109]
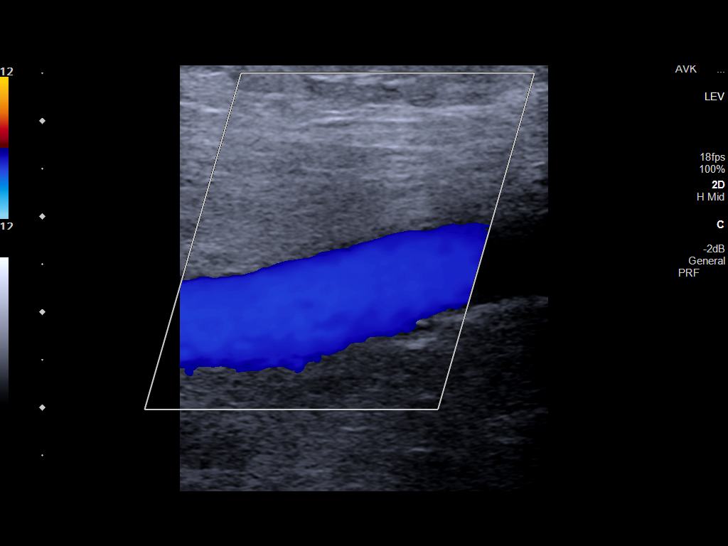
[im 47/109]
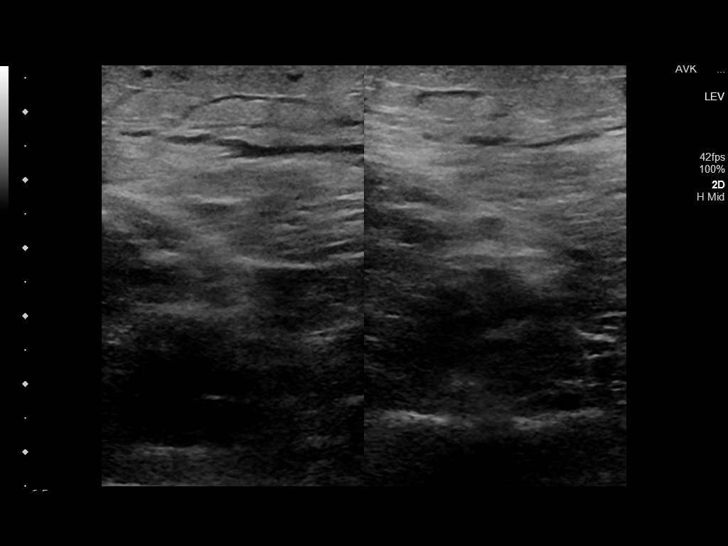
[im 57/109]
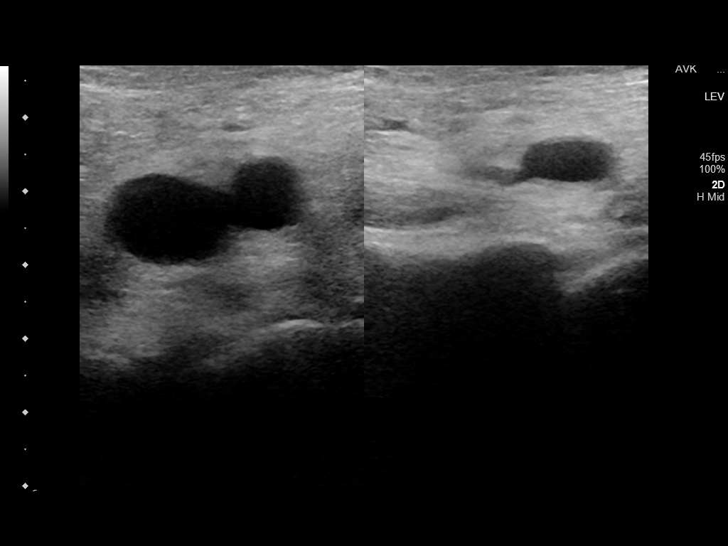
[im 62/109]
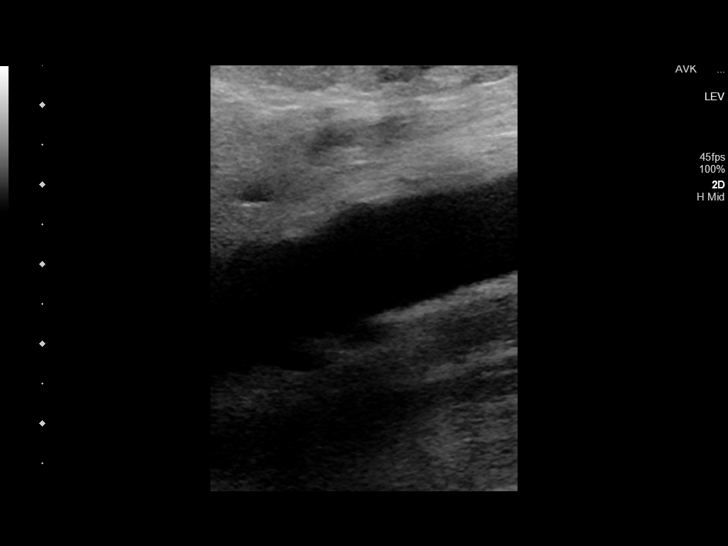
[im 71/109]
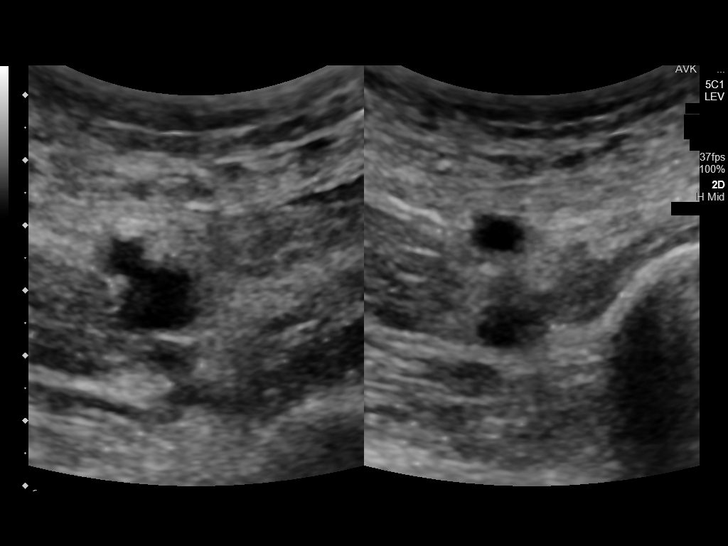
[im 80/109]
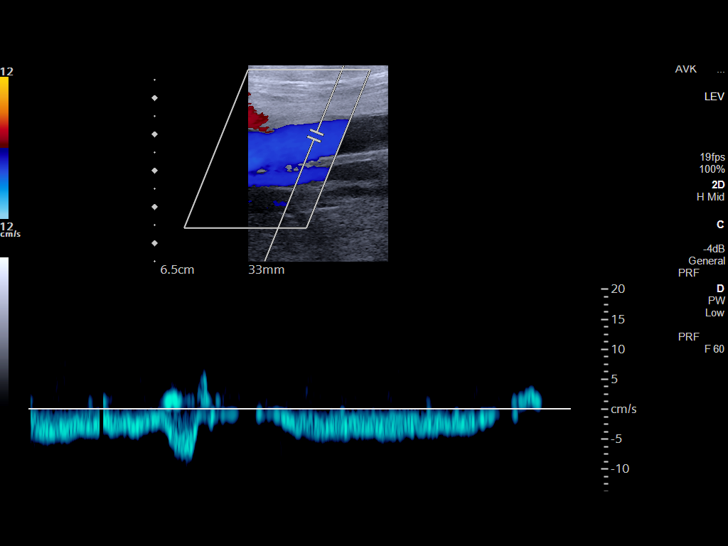
[im 90/109]
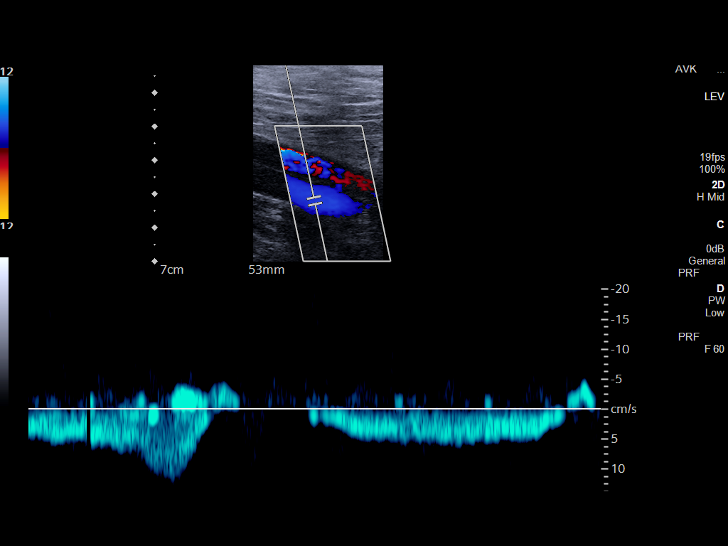
[im 99/109]
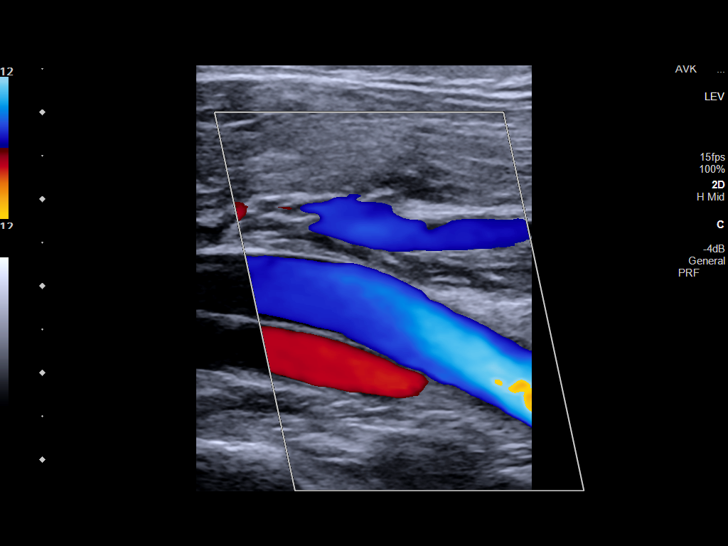
[im 109/109]
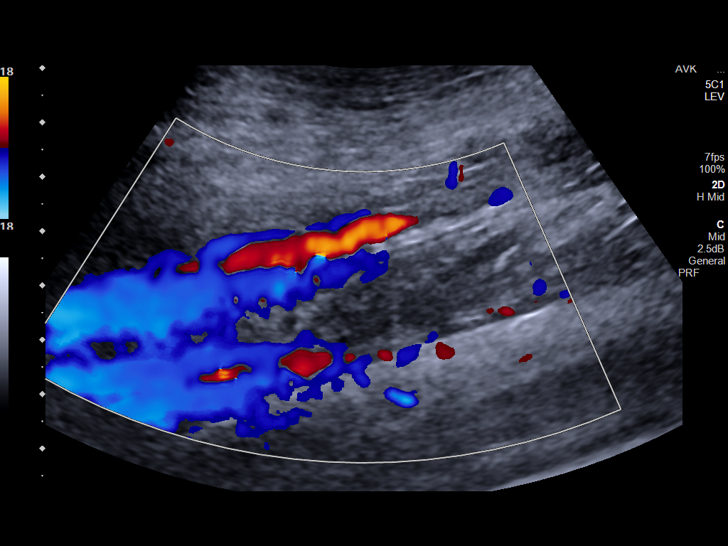

[13 of 24 positions shown; findings below may reference images not displayed]

FINDINGS: RIGHT LOWER EXTREMITY

Common Femoral Vein: No evidence of thrombus. Normal
compressibility, respiratory phasicity and response to augmentation.

Saphenofemoral Junction: No evidence of thrombus. Normal
compressibility and flow on color Doppler imaging.

Profunda Femoral Vein: No evidence of thrombus. Normal
compressibility and flow on color Doppler imaging.

Femoral Vein: No evidence of thrombus. Normal compressibility,
respiratory phasicity and response to augmentation.

Popliteal Vein: No evidence of thrombus. Normal compressibility,
respiratory phasicity and response to augmentation.

Calf Veins: No evidence of thrombus. Normal compressibility and flow
on color Doppler imaging.

Superficial Great Saphenous Vein: No evidence of thrombus. Normal
compressibility and flow on color Doppler imaging.

Other Findings:  None.

LEFT LOWER EXTREMITY

Common Femoral Vein: No evidence of thrombus. Normal
compressibility, respiratory phasicity and response to augmentation.

Saphenofemoral Junction: No evidence of thrombus. Normal
compressibility and flow on color Doppler imaging.

Profunda Femoral Vein: No evidence of thrombus. Normal
compressibility and flow on color Doppler imaging.

Femoral Vein: No evidence of thrombus. Normal compressibility,
respiratory phasicity and response to augmentation.

Popliteal Vein: No evidence of thrombus. Normal compressibility,
respiratory phasicity and response to augmentation.

Calf Veins: No evidence of thrombus. Normal compressibility and flow
on color Doppler imaging.

Superficial Great Saphenous Vein: No evidence of thrombus. Normal
compressibility and flow on color Doppler imaging.

Other Findings:  None.
IMPRESSION: Sonographic survey of the bilateral lower extremities negative for
DVT

## 2021-04-13 IMAGING — MR MR ABDOMEN WO/W CM
18 series · 48 of 48 positions shown · IV contrast (9ml Gadavist)
Comparison: No prior abdominal MRI. Abdominal ultrasound
[DATE].

CLINICAL DATA: 45-year-old male with history of cirrhosis and
possible hepatic mass noted on prior ultrasound examination.
Follow-up study to further evaluate.

EXAM:
MRI ABDOMEN WITHOUT AND WITH CONTRAST
TECHNIQUE: Multiplanar multisequence MR imaging of the abdomen was performed
both before and after the administration of intravenous contrast.
CONTRAST:  9mL GADAVIST GADOBUTROL 1 MMOL/ML IV SOLN

[Series 3: T2 · coronal · 6.5mm · 1.25mm/px · 2 of 40 slices shown (1 of 2)]
[im 1/40]
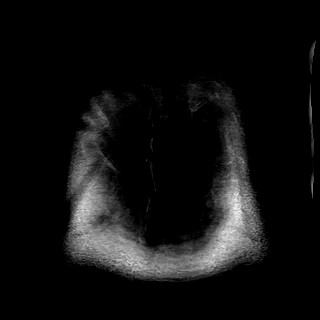
[im 40/40]
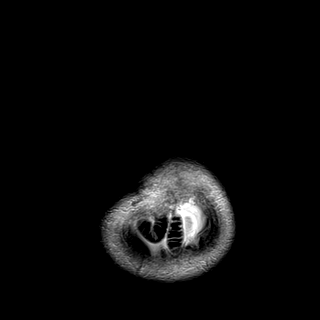

[Series 4: T2 · axial · 7.0mm · 1.25mm/px · z∈[-106,+154]mm · 2 of 32 slices shown (2 of 2)]
[im 1/32]
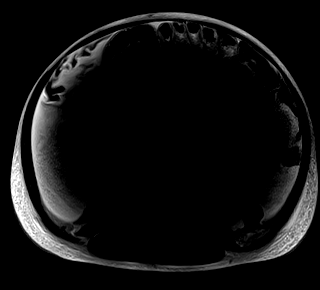
[im 32/32]
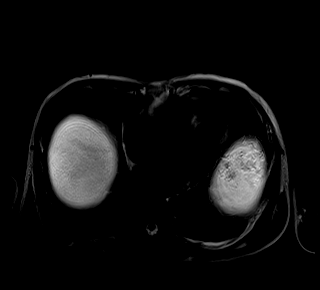

[Series 6: T2 fat-sat · axial · 7.0mm · 1.19mm/px · z∈[-122,+172]mm · 2 of 36 slices shown]
[im 1/36]
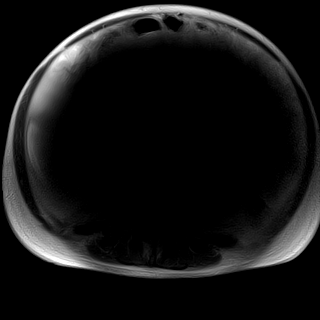
[im 36/36]
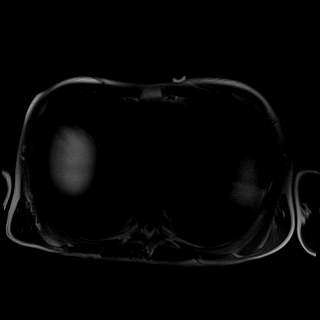

[Series 7: ax dwi_tracew · axial · 7.0mm · 1.42mm/px · z∈[-122,+172]mm · 2 of 36 slices shown]
[im 1/36]
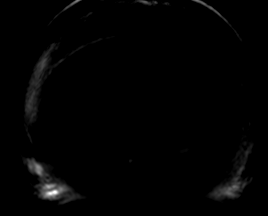
[im 36/36]
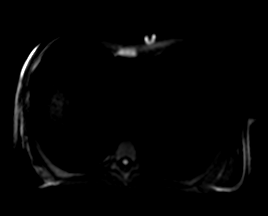

[Series 8: ax dwi_adc · axial · 7.0mm · 1.42mm/px · 1 of 36 slices shown]
[im 1/36]
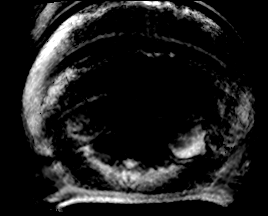

[Series 9: T1 · axial · 3.0mm · 1.19mm/px · z∈[-130,+179]mm · 4 of 104 slices shown (1 of 2)]
[im 1/104]
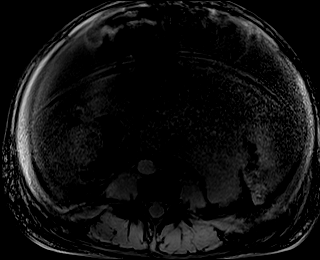
[im 35/104]
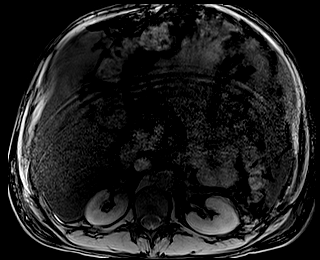
[im 69/104]
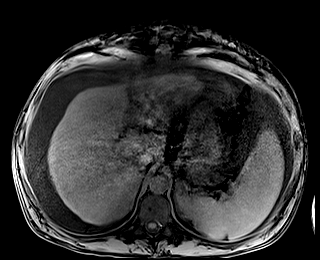
[im 104/104]
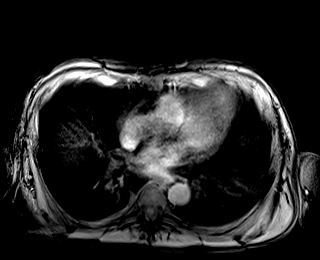

[Series 10: T1 · axial · 3.0mm · 1.19mm/px · z∈[-130,+179]mm · 4 of 104 slices shown (2 of 2)]
[im 1/104]
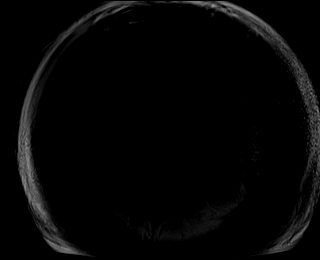
[im 35/104]
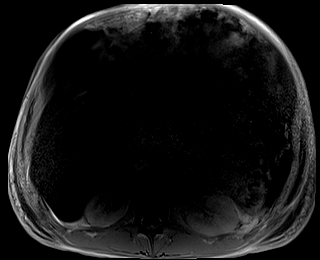
[im 69/104]
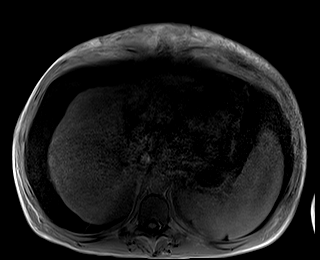
[im 104/104]
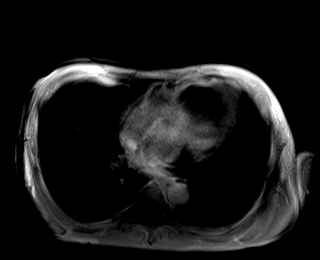

[Series 11: bSSFP · axial · 7.0mm · 0.74mm/px · 1 of 32 slices shown]
[im 1/32]
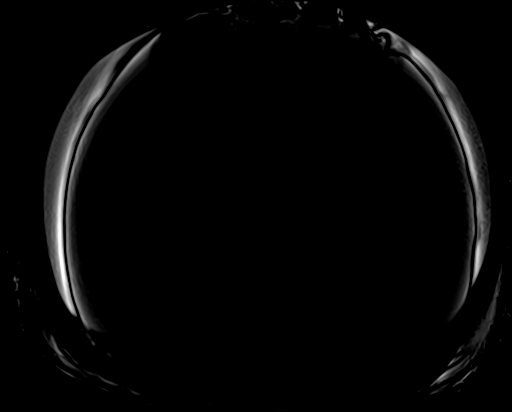

[Series 12: T1 dynamic fat-sat · axial · non-contrast · 3.5mm · 1.19mm/px · z∈[-142,+190]mm · 3 of 96 slices shown (1 of 5)]
[im 1/96]
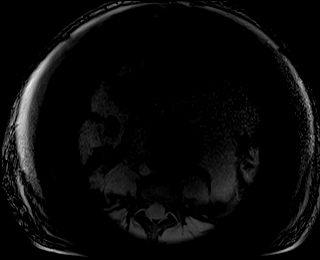
[im 48/96]
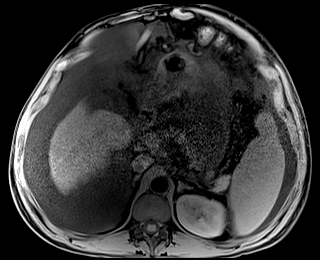
[im 96/96]
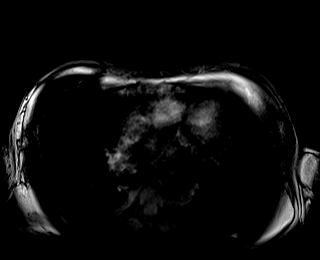

[Series 13: T1 dynamic fat-sat post-contrast · axial · 3.5mm · 1.19mm/px · z∈[-142,+190]mm · 3 of 96 slices shown (1 of 4)]
[im 1/96]
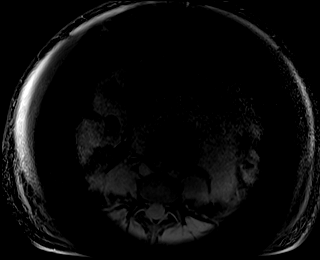
[im 48/96]
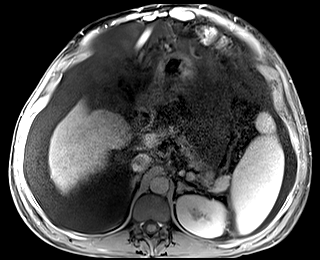
[im 96/96]
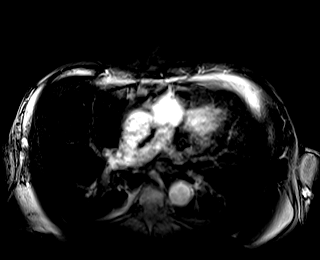

[Series 14: T1 dynamic fat-sat · axial · 3.5mm · 1.19mm/px · z∈[-142,+190]mm · 3 of 96 slices shown (2 of 5)]
[im 1/96]
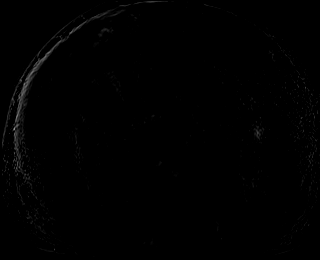
[im 48/96]
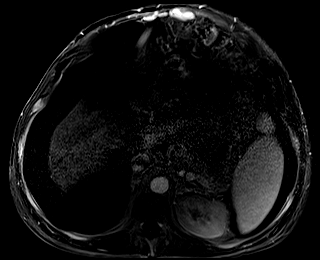
[im 96/96]
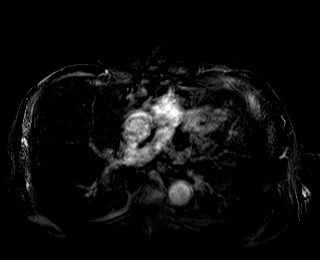

[Series 15: T1 dynamic fat-sat post-contrast · axial · 3.5mm · 1.19mm/px · z∈[-142,+190]mm · 3 of 96 slices shown (2 of 4)]
[im 1/96]
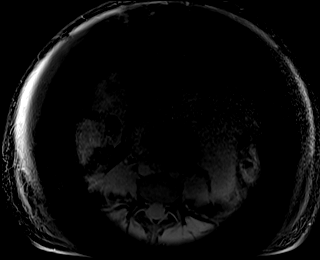
[im 48/96]
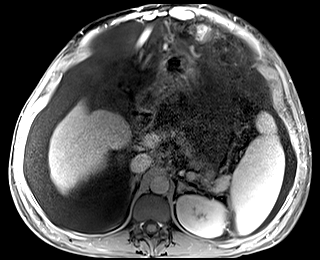
[im 96/96]
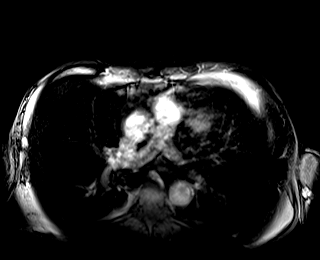

[Series 16: T1 dynamic fat-sat · axial · 3.5mm · 1.19mm/px · z∈[-142,+190]mm · 3 of 96 slices shown (3 of 5)]
[im 1/96]
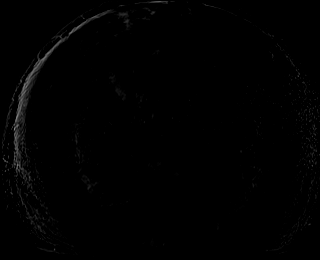
[im 48/96]
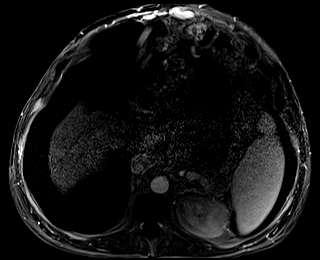
[im 96/96]
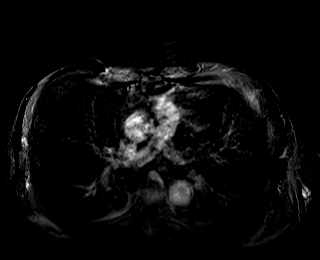

[Series 17: T1 dynamic fat-sat post-contrast · axial · 3.5mm · 1.19mm/px · z∈[-142,+190]mm · 3 of 96 slices shown (3 of 4)]
[im 1/96]
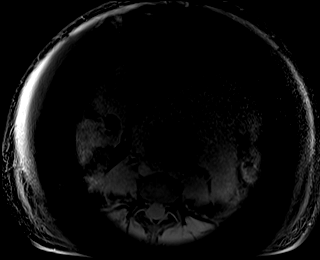
[im 48/96]
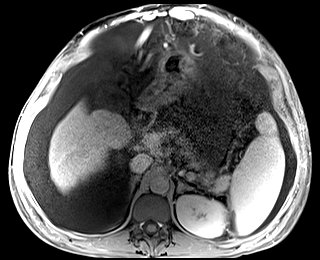
[im 96/96]
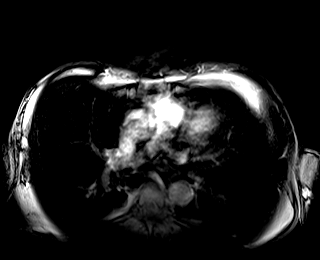

[Series 18: T1 dynamic fat-sat · axial · 3.5mm · 1.19mm/px · z∈[-142,+190]mm · 3 of 96 slices shown (4 of 5)]
[im 1/96]
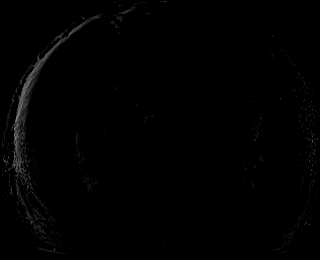
[im 48/96]
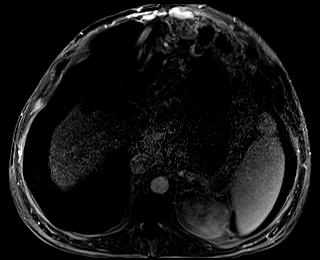
[im 96/96]
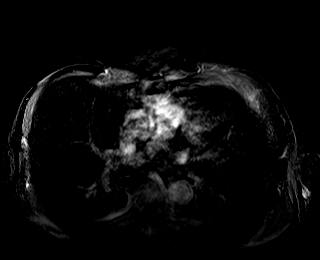

[Series 19: T1 dynamic post-contrast · coronal · 3.0mm · 1.31mm/px · 3 of 72 slices shown]
[im 1/72]
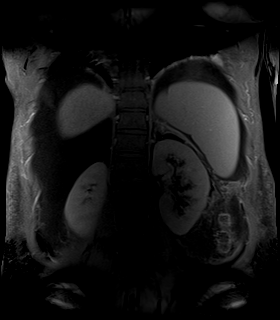
[im 36/72]
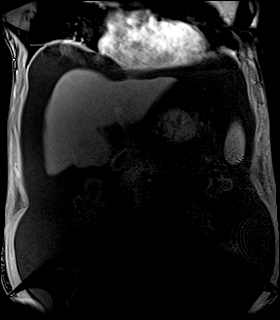
[im 72/72]
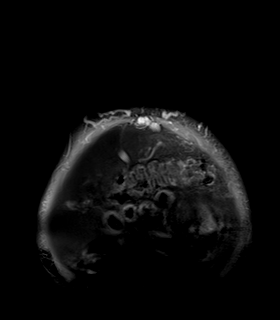

[Series 20: T1 dynamic fat-sat post-contrast · axial · 3.5mm · 1.19mm/px · z∈[-142,+190]mm · 3 of 96 slices shown (4 of 4)]
[im 1/96]
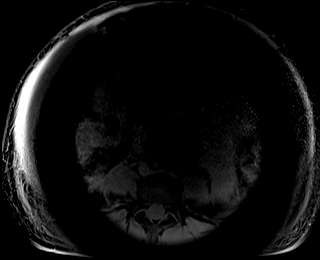
[im 48/96]
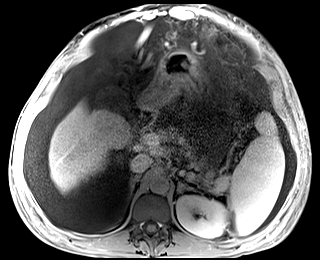
[im 96/96]
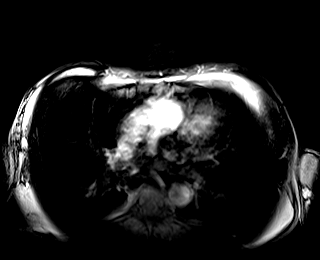

[Series 21: T1 dynamic fat-sat · axial · 3.5mm · 1.19mm/px · z∈[-142,+190]mm · 3 of 96 slices shown (5 of 5)]
[im 1/96]
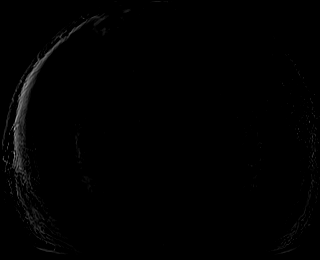
[im 48/96]
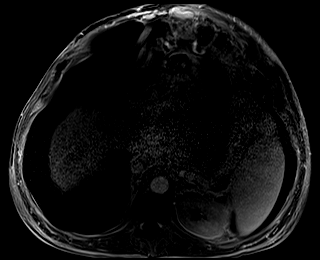
[im 96/96]
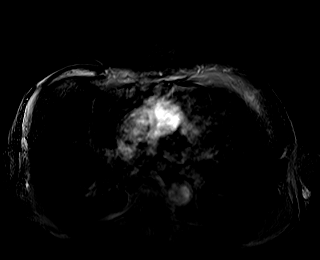

[48 of 48 positions shown; findings below may reference images not displayed]

FINDINGS: Comment: Today's study is limited by extremely poor contrast bolus,
such that the examination nearly appears as a noncontrast study
(although there is clearly gadolinium in the abdominal aorta and in
several venous collaterals in the anterior abdominal wall). This
limits the sensitivity and specificity of the examination,
particularly with respect to detection and characterization of
visceral and/or vascular abnormalities.

Lower chest: Unremarkable.

Hepatobiliary: Liver has a shrunken appearance and nodular contour
indicative of underlying cirrhosis. Extending from the medial aspect
of the right lobe of the liver involving segment 5 (axial image 19
of series 6 and coronal image 20 of series 3) there is a mass-like
contour abnormality estimated to measure approximately 5.5 x 3.3 x
4.0 cm. However, this portion of the liver maintains signal
characteristics compatible with the adjacent normal portions of the
liver on all pulse sequences, without definite altered perfusion on
today's limited postcontrast imaging, and with no definite
restricted diffusion. No other suspicious hepatic lesions are
confidently identified on today's examination. No intra or
extrahepatic biliary ductal dilatation. Gallbladder is unremarkable
in appearance.

Pancreas: No pancreatic mass. No pancreatic ductal dilatation. No
peripancreatic fluid collections.

Spleen: Spleen is enlarged measuring 14.0 x 5.7 x 14.0 cm (estimated
splenic volume of 559 mL) .

Adrenals/Urinary Tract: Bilateral kidneys and adrenal glands are
normal in appearance. No hydroureteronephrosis in the visualized
portions of the abdomen.

Stomach/Bowel: Visualized portions are unremarkable.

Vascular/Lymphatic: No aneurysm identified in the visualized
abdominal vasculature. Portal vein is dilated measuring 16 mm in the
porta hepatis. Numerous portosystemic collateral pathways, most
notable for recanalized paraumbilical vein and prominent varices in
the upper anterior abdominal wall. No definite lymphadenopathy
confidently identified in the abdomen.

Other:  Very large volume of ascites.

Musculoskeletal: No aggressive appearing osseous lesions are noted
in the visualized portions of the skeleton.
IMPRESSION: 1. Today's study is severely limited by suboptimal contrast bolus.
The suspected mass in the right lobe of the liver appears to simply
represent a focal nodular contour of the liver, as it maintains
signal characteristics compatible with surrounding hepatic
parenchyma on all pulse sequences. Correlation with AFP levels is
recommended. If AFP is elevated, repeat abdominal MRI with and
without IV gadolinium is recommended after better IV access is
established.
2. Hepatic cirrhosis with very large volume of ascites.
3. Evidence of portal venous hypertension, including dilated portal
vein and splenomegaly with numerous portosystemic collateral
pathways.

## 2021-04-13 MED ORDER — POTASSIUM CHLORIDE 20 MEQ PO PACK
40.0000 meq | PACK | Freq: Once | ORAL | Status: AC
  Administered 2021-04-13: 09:00:00 40 meq via ORAL
  Filled 2021-04-13: qty 2

## 2021-04-13 MED ORDER — SPIRONOLACTONE 25 MG PO TABS
25.0000 mg | ORAL_TABLET | Freq: Every day | ORAL | Status: DC
  Administered 2021-04-13: 25 mg via ORAL
  Filled 2021-04-13: qty 1

## 2021-04-13 MED ORDER — AMLODIPINE BESYLATE 5 MG PO TABS
5.0000 mg | ORAL_TABLET | Freq: Every day | ORAL | Status: DC
  Administered 2021-04-13: 02:00:00 5 mg via ORAL
  Filled 2021-04-13: qty 1

## 2021-04-13 MED ORDER — POTASSIUM CHLORIDE 20 MEQ PO PACK
40.0000 meq | PACK | Freq: Once | ORAL | Status: AC
  Administered 2021-04-13: 02:00:00 40 meq via ORAL
  Filled 2021-04-13: qty 2

## 2021-04-13 MED ORDER — OXYCODONE HCL 5 MG PO TABS
5.0000 mg | ORAL_TABLET | ORAL | Status: DC | PRN
  Administered 2021-04-13: 23:00:00 5 mg via ORAL
  Filled 2021-04-13: qty 1

## 2021-04-13 MED ORDER — SPIRONOLACTONE 25 MG PO TABS
100.0000 mg | ORAL_TABLET | Freq: Every day | ORAL | Status: DC
  Administered 2021-04-14 – 2021-04-15 (×2): 100 mg via ORAL
  Filled 2021-04-13 (×2): qty 4

## 2021-04-13 MED ORDER — VITAMIN K1 10 MG/ML IJ SOLN
10.0000 mg | Freq: Every day | INTRAMUSCULAR | Status: AC
  Administered 2021-04-13 – 2021-04-15 (×3): 10 mg via SUBCUTANEOUS
  Filled 2021-04-13 (×6): qty 1

## 2021-04-13 MED ORDER — ACETAMINOPHEN 325 MG PO TABS: 325.0000 mg | ORAL_TABLET | Freq: Four times a day (QID) | ORAL | Status: DC | PRN

## 2021-04-13 MED ORDER — ALBUMIN HUMAN 25 % IV SOLN
25.0000 g | Freq: Once | INTRAVENOUS | Status: AC
  Administered 2021-04-13: 25 g via INTRAVENOUS
  Filled 2021-04-13: qty 100

## 2021-04-13 MED ORDER — LABETALOL HCL 5 MG/ML IV SOLN: 20.0000 mg | INTRAVENOUS | Status: DC | PRN

## 2021-04-13 MED ORDER — TRAMADOL HCL 50 MG PO TABS
50.0000 mg | ORAL_TABLET | Freq: Four times a day (QID) | ORAL | Status: DC | PRN
Start: 2021-04-13 — End: 2021-04-15
  Administered 2021-04-13 (×2): 50 mg via ORAL
  Filled 2021-04-13 (×2): qty 1

## 2021-04-13 MED ORDER — GADOBUTROL 1 MMOL/ML IV SOLN
9.0000 mL | Freq: Once | INTRAVENOUS | Status: AC | PRN
  Administered 2021-04-13: 07:00:00 9 mL via INTRAVENOUS

## 2021-04-13 NOTE — Progress Notes (Signed)
Brian Vasquez  MWN:[id number] DOB: 01-Aug-1975 DOA: 04/12/2021 PCP: Ellene Route    Brief Narrative:  45 year old with a history of alcoholism and HTN who presented to the ED with worsening abdominal distention and discomfort over 1 to 2 weeks.  This was accompanied by dyspnea on exertion but no chest pain cough or wheezing.  He had reported running out of his HCTZ after recently moving to the area from [place].  In the ED he was found to have a sodium of 131 and a potassium of 3.4.  Total bilirubin was 7.1 and INR was 1.5.  He was incidentally found to be COVID-positive.  Abdominal ultrasound revealed cirrhosis and a 4.9 cm masslike area in the caudate lobe of the liver worrisome for neoplasm, as well as large volume ascites.  Consultants:  GI  Code Status: FULL CODE  Antimicrobials:  None  Date of Positive COVID Test:  04/12/21  COVID-19 specific Treatment: Remdesivir x1 10/8  DVT prophylaxis: SCDs  Subjective: Afebrile.  Vital signs stable.  Saturation 99% room air.  Alert and conversant at the time of my visit.  Has no new complaints.  Has had a lengthy discussion with the gastroenterologist.  Assessment & Plan:  Alcoholic liver cirrhosis with liver failure Initiate medical management -GI to follow  Large-volume symptomatic abdominal ascites Paracentesis in IR pending  Hyponatremia Due to liver disease -monitor trend  Hypokalemia Supplement and follow -likely due to poor nutrition in setting of alcoholism -check magnesium  Significantly elevated D-dimer Check venous duplex to rule out DVT -may simply be due to [place] but given COVID-positive status as well as possibility of a liver cancer this places him at high risk for actual clotting -avoid empiric anticoagulation as patient is already coagulopathic due to liver disease -consider CT chest to rule out PE  Alcohol abuse Reports having discontinued alcohol 2 weeks ago  4.9 cm newly appreciated liver  mass MRI abdomen pending  Uncontrolled HTN Blood pressure presently reasonably controlled  Incidental COVID infection No symptoms attributable to this infection at present -patient reports he was symptomatic with COVID in late August -I suspect his current positive test is in fact related to that and delayed clearance due to his severe chronic disease rather than an active COVID infection  Family Communication:  Status is: Inpatient  Remains inpatient appropriate because:Inpatient level of care appropriate due to severity of illness  Dispo: The patient is from: Home              Anticipated d/c is to: Home              Patient currently is not medically stable to d/c.   Difficult to place patient No   Objective: Blood pressure (!) 143/99, pulse 94, temperature 98 F (36.7 C), resp. rate 17, height 5\' 10"  (1.778 m), weight 91.9 kg, SpO2 99 %.  Intake/Output Summary (Last 24 hours) at 04/13/2021 0752 Last data filed at 04/13/2021 0507 Gross per 24 hour  Intake --  Output 300 ml  Net -300 ml   Filed Weights   04/13/21 0011  Weight: 91.9 kg    Examination: General: No acute respiratory distress Lungs: Clear to auscultation bilaterally without wheezes or crackles Cardiovascular: Regular rate and rhythm without murmur gallop or rub normal S1 and S2 Abdomen: Distended/protuberant, soft, no rebound, positive fluid wave, no mass Extremities: 2+ pitting edema bilateral lower extremities  CBC: Recent Labs  Lab 04/12/21 1947 04/13/21 0215  WBC 11.3* 9.1  NEUTROABS  --  6.1  HGB 14.8 14.5  HCT 41.2 40.9  MCV 96.3 96.7  PLT 171 161*   Basic Metabolic Panel: Recent Labs  Lab 04/12/21 1947 04/13/21 0215  NA 131* 134*  K 3.4* 3.1*  CL 98 101  CO2 24 27  GLUCOSE 110* 89  BUN 7 7  CREATININE 0.70 0.63  CALCIUM 8.6* 8.3*   GFR: Estimated Creatinine Clearance: 131.5 mL/min (by C-G formula based on SCr of 0.63 mg/dL).  Liver Function Tests: Recent Labs  Lab  04/12/21 1947 04/13/21 0215  AST 145* 134*  ALT 78* 77*  ALKPHOS 207* 190*  BILITOT 7.1* 6.4*  PROT 7.3 6.9  ALBUMIN 3.2* 2.8*   Recent Labs  Lab 04/12/21 1947  LIPASE 60*    Coagulation Profile: Recent Labs  Lab 04/12/21 1947  INR 1.5*     Recent Results (from the past 240 hour(s))  Resp Panel by RT-PCR (Flu A&B, Covid) Nasopharyngeal Swab     Status: Abnormal   Collection Time: 04/12/21  8:58 PM   Specimen: Nasopharyngeal Swab; Nasopharyngeal(NP) swabs in vial transport medium  Result Value Ref Range Status   SARS Coronavirus 2 by RT PCR POSITIVE (A) NEGATIVE Final    Comment: RESULT CALLED TO, READ BACK BY AND VERIFIED WITH: Shuronia Pflueger @ 2154 on 04/12/2021 by caf (NOTE) SARS-CoV-2 target nucleic acids are DETECTED.  The SARS-CoV-2 RNA is generally detectable in upper respiratory specimens during the acute phase of infection. Positive results are indicative of the presence of the identified virus, but do not rule out bacterial infection or co-infection with other pathogens not detected by the test. Clinical correlation with patient history and other diagnostic information is necessary to determine patient infection status. The expected result is Negative.  Fact Sheet for Patients: EntrepreneurPulse.com.au  Fact Sheet for Healthcare Providers: IncredibleEmployment.be  This test is not yet approved or cleared by the [place] FDA and  has been authorized for detection and/or diagnosis of SARS-CoV-2 by FDA under an Emergency Use Authorization (EUA).  This EUA will remain in effect (meaning this  test can be used) for the duration of  the COVID-19 declaration under Section 564(b)(1) of the Act, 21 U.S.C. section 360bbb-3(b)(1), unless the authorization is terminated or revoked sooner.     Influenza A by PCR NEGATIVE NEGATIVE Final   Influenza B by PCR NEGATIVE NEGATIVE Final    Comment: (NOTE) The Xpert Xpress  SARS-CoV-2/FLU/RSV plus assay is intended as an aid in the diagnosis of influenza from Nasopharyngeal swab specimens and should not be used as a sole basis for treatment. Nasal washings and aspirates are unacceptable for Xpert Xpress SARS-CoV-2/FLU/RSV testing.  Fact Sheet for Patients: EntrepreneurPulse.com.au  Fact Sheet for Healthcare Providers: IncredibleEmployment.be  This test is not yet approved or cleared by the [place] FDA and has been authorized for detection and/or diagnosis of SARS-CoV-2 by FDA under an Emergency Use Authorization (EUA). This EUA will remain in effect (meaning this test can be used) for the duration of the COVID-19 declaration under Section 564(b)(1) of the Act, 21 U.S.C. section 360bbb-3(b)(1), unless the authorization is terminated or revoked.  Performed at [facility], [address] [place], [place] [address]      Scheduled Meds:  amLODipine  5 mg Oral Daily   vitamin C  500 mg Oral Daily   folic acid  1 mg Oral Daily   multivitamin with minerals  1 tablet Oral Daily   spironolactone  25 mg Oral Daily   thiamine  100 mg Oral Daily   zinc sulfate  220 mg Oral Daily   Continuous Infusions:  sodium chloride 75 mL/hr at 04/13/21 0037   [START ON 04/14/2021] remdesivir 100 mg in NS 100 mL       LOS: 1 day   Cherene Altes, MD [facility] Office  [phone] Pager - Text Page per Shea Evans  If 7PM-7AM, please contact night-coverage per Amion 04/13/2021, 7:52 AM

## 2021-04-13 NOTE — Consult Note (Addendum)
Brian Vasquez , MD [address] [place], [place], [place], [address] [address] [place], [place], [place], [address] Phone: [phone]  Fax: [phone]  Consultation  Referring Provider:     Dr Sidney Ace Primary Care Physician:  Ellene Route Primary Gastroenterologist: None          Reason for Consultation:     Liver failure  Date of Admission:  04/12/2021 Date of Consultation:  04/13/2021         HPI:   Brian Vasquez is a 45 y.o. male presented last night to the ER with abdominal pain and fatigue.  History of heavy alcohol abuse.2 glasses of whisky each night every night for over 5 years, no illegal drug use. Has tatoos. No prior history of abdominal distension   He recently moved from [place].  In the ER he underwent abdominal ultrasound that showed cirrhosis with a 4.9 cm masslike lesion in the caudate lobe neoplasm cannot be excluded large volume ascites.  She was admitted.   04/12/2021: Creatinine 0.7 elevated transaminases with AST 145 ALT 78 alkaline phosphatase 207 and total bilirubin of 7.1.  Hemoglobin 14.8 g with MCV of 96 and a platelet count of 171.  Acute hepatitis panel in process  Main complaint presently is abdominal distension.    Past Medical History:  Diagnosis Date  . Hypertension     History reviewed. No pertinent surgical history.  Prior to Admission medications   Not on File    History reviewed. No pertinent family history.   Social History   Tobacco Use  . Smoking status: Former    Types: Cigarettes  . Smokeless tobacco: Never  Vaping Use  . Vaping Use: Never used  Substance Use Topics  . Alcohol use: Yes    Comment: Daily- 2 liquor drinks/day  . Drug use: Not Currently    Allergies as of 04/12/2021  . (No Known Allergies)    Review of Systems:    All systems reviewed and negative except where noted in HPI.   Physical Exam:  Vital signs in last 24 hours: Temp:  [98 F (36.7 C)-98.9 F (37.2 C)] 98 F (36.7 C)  (10/09 0751) Pulse Rate:  [81-118] 94 (10/09 0751) Resp:  [15-21] 17 (10/09 0751) BP: (120-156)/(87-118) 143/99 (10/09 0751) SpO2:  [95 %-100 %] 99 % (10/09 0751) Weight:  [91.9 kg] 91.9 kg (10/09 0011) Last BM Date: 04/12/21 (per pt report) General:   Pleasant, cooperative in NAD Head:  Normocephalic and atraumatic. Eyes:   No icterus.   Conjunctiva pink. PERRLA. Ears:  Normal auditory acuity. Neck:  Supple; no masses or thyroidomegaly Lungs: Respirations even and unlabored. Lungs clear to auscultation bilaterally.   No wheezes, crackles, or rhonchi.  Heart:  Regular rate and rhythm;  Without murmur, clicks, rubs or gallops Abdomen: grossly distended , tense scites , nontender. Normal bowel sounds.  No rebound or guarding.  Neurologic:  Alert and oriented x3;  grossly normal neurologically. Cervical Nodes:  No significant cervical adenopathy. Psych:  Alert and cooperative. Normal affect.  LAB RESULTS: Recent Labs    04/12/21 1947 04/13/21 0215  WBC 11.3* 9.1  HGB 14.8 14.5  HCT 41.2 40.9  PLT 171 146*   BMET Recent Labs    04/12/21 1947 04/13/21 0215  NA 131* 134*  K 3.4* 3.1*  CL 98 101  CO2 24 27  GLUCOSE 110* 89  BUN 7 7  CREATININE 0.70 0.63  CALCIUM 8.6* 8.3*   LFT Recent Labs  04/13/21 0215  PROT 6.9  ALBUMIN 2.8*  AST 134*  ALT 77*  ALKPHOS 190*  BILITOT 6.4*   PT/INR Recent Labs    04/12/21 1947  LABPROT 17.9*  INR 1.5*    STUDIES: DG Chest 2 View  Result Date: 04/12/2021 CLINICAL DATA:  Abdominal distension and swelling, ankle swelling, shortness of breath EXAM: CHEST - 2 VIEW COMPARISON:  None FINDINGS: Normal heart size, mediastinal contours, and pulmonary vascularity. Mild bibasilar atelectasis and decreased lung volumes. Remaining lungs clear. No acute infiltrate, pleural effusion, or pneumothorax. Osseous structures unremarkable. IMPRESSION: Decreased lung volumes with bibasilar atelectasis. Electronically Signed   By: Lavonia Dana M.D.    On: 04/12/2021 20:12   US ABDOMEN LIMITED RUQ (LIVER/GB)  Result Date: 04/12/2021 CLINICAL DATA:  Abdominal distension, ascites EXAM: ULTRASOUND ABDOMEN LIMITED RIGHT UPPER QUADRANT COMPARISON:  None. FINDINGS: Gallbladder: No evidence of shadowing gallstones or gallbladder sludge. Nonspecific gallbladder wall thickening measuring 4 mm likely due to adjacent ascites. Negative sonographic Murphy sign. Common bile duct: Diameter: 3 mm Liver: The liver echotexture is coarsened, with nodularity of the liver capsule, consistent with cirrhosis. Within the region of the caudate lobe there is a 4.9 by 4.8 by 3.9 cm slightly hypoechoic area, which could reflect underlying mass. Dedicated nonemergent follow-up liver MRI recommended for further characterization. Portal vein is patent on color Doppler imaging with normal direction of blood flow towards the liver. Other: Large volume ascites identified throughout the abdomen. IMPRESSION: 1. Cirrhosis, with 4.9 cm masslike hypoechoic area in the caudate lobe. Underlying neoplasm cannot be excluded, and dedicated nonemergent liver MRI recommended for follow-up. 2. Large volume ascites. Electronically Signed   By: Randa Ngo M.D.   On: 04/12/2021 22:11      Impression / Plan:   Brian Vasquez is a 45 y.o. y/o male MRI liver ordered and completed results awaited.  Common bile duct diameter 3 mm.  With a history of heavy alcohol use presented to the emergency room with abdominal pain, distention and on imaging had large volume ascites and a masslike lesion in the liver.Likely has alcoholic liver cirrhosis   Labs show that his liver function tests are abnormal.  INR is elevated over 1.5 suggesting acute liver failure.  Plan 1.  Diagnostic and therapeutic abdominal paracentesis.  Send labs to rule out SBP and calculate SAAG gradient. 2.  CIWA protocol, IV thiamine to prevent Wernicke's encephalopathy 3.  Vitamin K 10 mg subcutaneously once daily for 3 days to  reverse any vitamin K deficiency related elevated INR. 4. Followup MRI liver 5. Low salt diet , commence on aldactone 100 mg a day and check BMP in 48 hours if potassium is normal then can add lasix 40 mg a day oral. 6. Stop alcohol consumption  7. Follow up acute hepatitis labs  8. Tylenol level   Thank you for involving me in the care of this patient.      LOS: 1 day   Brian Bellows, MD  04/13/2021, 8:46 AM

## 2021-04-14 ENCOUNTER — Inpatient Hospital Stay: Payer: 59

## 2021-04-14 DIAGNOSIS — K7031 Alcoholic cirrhosis of liver with ascites: Secondary | ICD-10-CM | POA: Diagnosis not present

## 2021-04-14 DIAGNOSIS — K769 Liver disease, unspecified: Secondary | ICD-10-CM

## 2021-04-14 LAB — CBC
HCT: 38.9 % — ABNORMAL LOW (ref 39.0–52.0)
Hemoglobin: 13.4 g/dL (ref 13.0–17.0)
MCH: 33.2 pg (ref 26.0–34.0)
MCHC: 34.4 g/dL (ref 30.0–36.0)
MCV: 96.3 fL (ref 80.0–100.0)
Platelets: 144 10*3/uL — ABNORMAL LOW (ref 150–400)
RBC: 4.04 MIL/uL — ABNORMAL LOW (ref 4.22–5.81)
RDW: 15.5 % (ref 11.5–15.5)
WBC: 9 10*3/uL (ref 4.0–10.5)
nRBC: 0 % (ref 0.0–0.2)

## 2021-04-14 LAB — BODY FLUID CELL COUNT WITH DIFFERENTIAL
Eos, Fluid: 0 %
Lymphs, Fluid: 27 %
Monocyte-Macrophage-Serous Fluid: 67 %
Neutrophil Count, Fluid: 6 %
Other Cells, Fluid: 0 %
Total Nucleated Cell Count, Fluid: 203 cu mm

## 2021-04-14 LAB — COMPREHENSIVE METABOLIC PANEL
ALT: 62 U/L — ABNORMAL HIGH (ref 0–44)
AST: 102 U/L — ABNORMAL HIGH (ref 15–41)
Albumin: 2.9 g/dL — ABNORMAL LOW (ref 3.5–5.0)
Alkaline Phosphatase: 156 U/L — ABNORMAL HIGH (ref 38–126)
Anion gap: 5 (ref 5–15)
BUN: 6 mg/dL (ref 6–20)
CO2: 25 mmol/L (ref 22–32)
Calcium: 8.5 mg/dL — ABNORMAL LOW (ref 8.9–10.3)
Chloride: 103 mmol/L (ref 98–111)
Creatinine, Ser: 0.64 mg/dL (ref 0.61–1.24)
GFR, Estimated: >60 mL/min (ref >60)
Glucose, Bld: 82 mg/dL (ref 70–99)
Potassium: 4.4 mmol/L (ref 3.5–5.1)
Sodium: 133 mmol/L — ABNORMAL LOW (ref 135–145)
Total Bilirubin: 6.5 mg/dL — ABNORMAL HIGH (ref 0.3–1.2)
Total Protein: 6.7 g/dL (ref 6.5–8.1)

## 2021-04-14 LAB — PROTIME-INR
INR: 1.7 — ABNORMAL HIGH (ref 0.8–1.2)
Prothrombin Time: 19.6 seconds — ABNORMAL HIGH (ref 11.4–15.2)

## 2021-04-14 LAB — HEPATITIS C ANTIBODY: HCV Ab: <0.1 s/co ratio — AB (ref 0.0–0.9)

## 2021-04-14 LAB — ALBUMIN, PLEURAL OR PERITONEAL FLUID: Albumin, Fluid: <1.5 g/dL

## 2021-04-14 LAB — HEPATITIS B SURFACE ANTIBODY, QUANTITATIVE: Hep B S AB Quant (Post): <3.1 m[IU]/mL — ABNORMAL LOW (ref >9.9)

## 2021-04-14 IMAGING — US US PARACENTESIS
1 series · 5 of 5 positions shown · non-contrast
Comparison: none

INDICATION: Cirrhosis with ascites request received for paracentesis.

[Series 1: us paracentesis · 0.26mm/px · 5 of 5 slices shown]
[im 1/5]
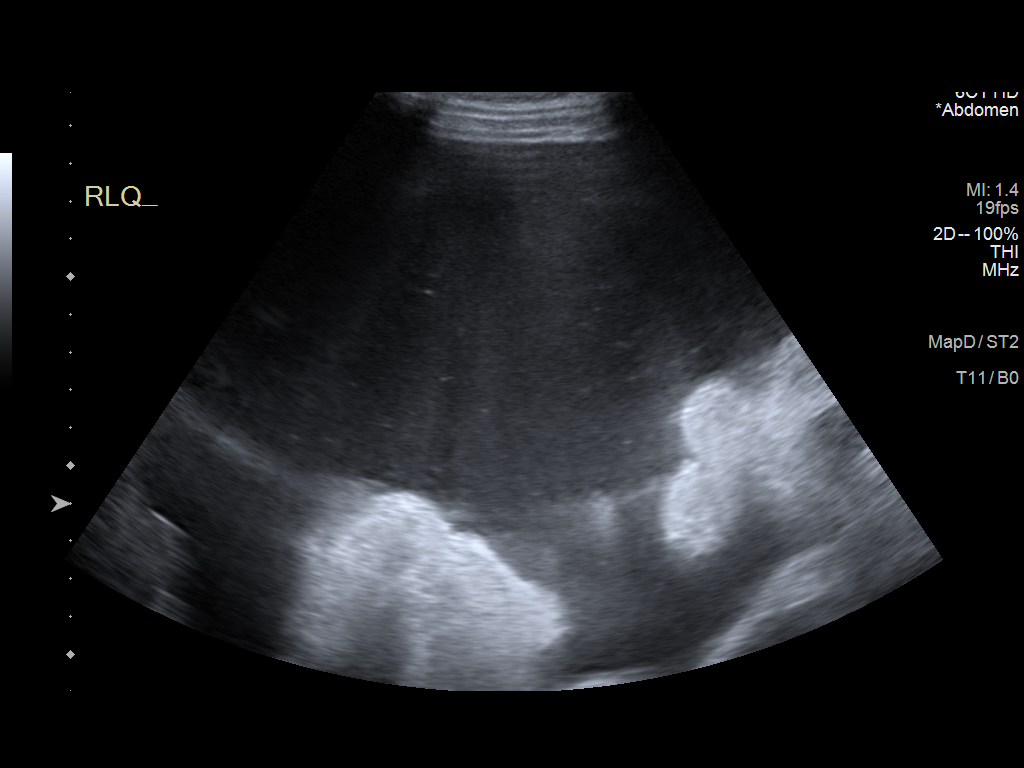
[im 2/5]
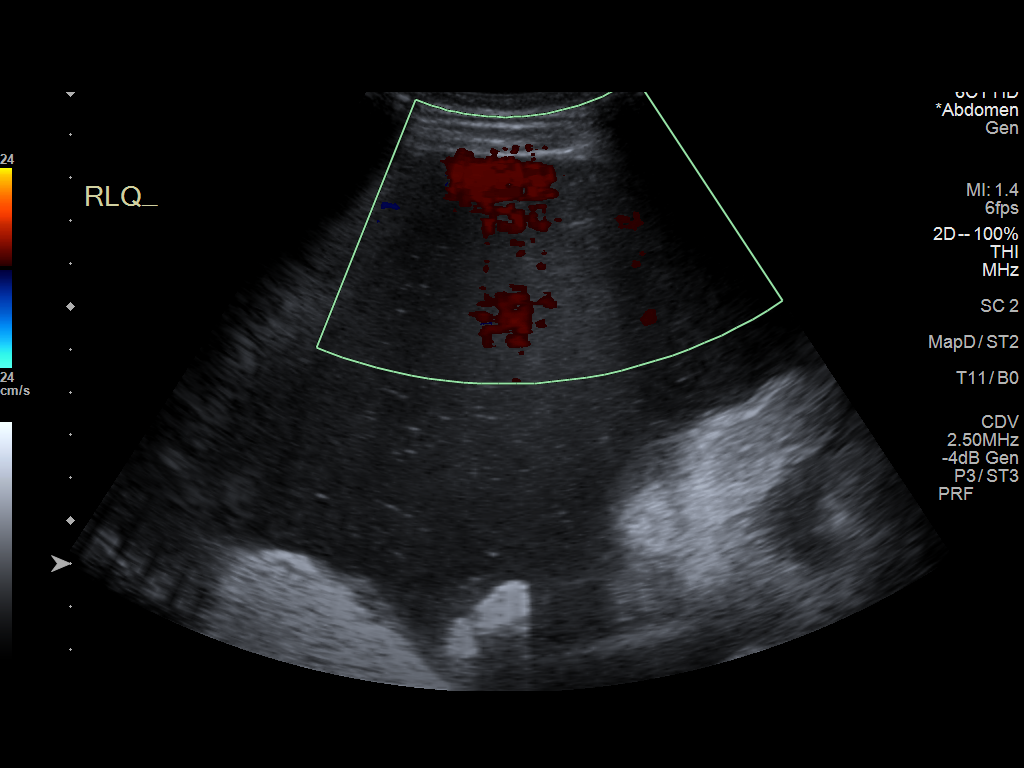
[im 3/5]
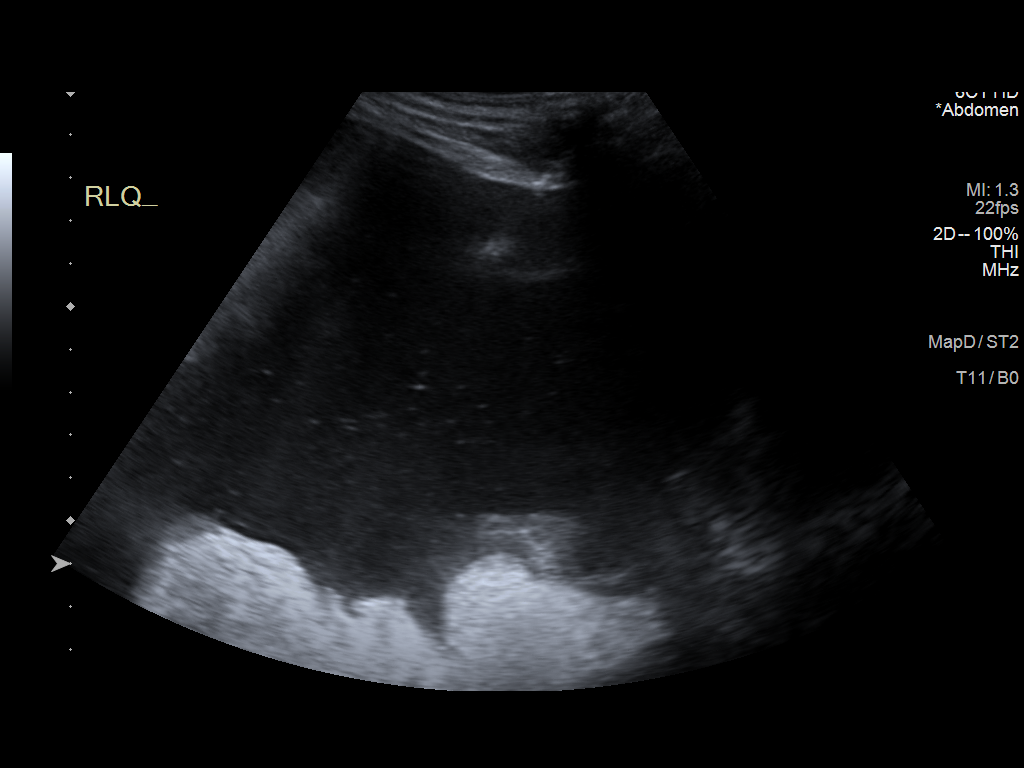
[im 4/5]
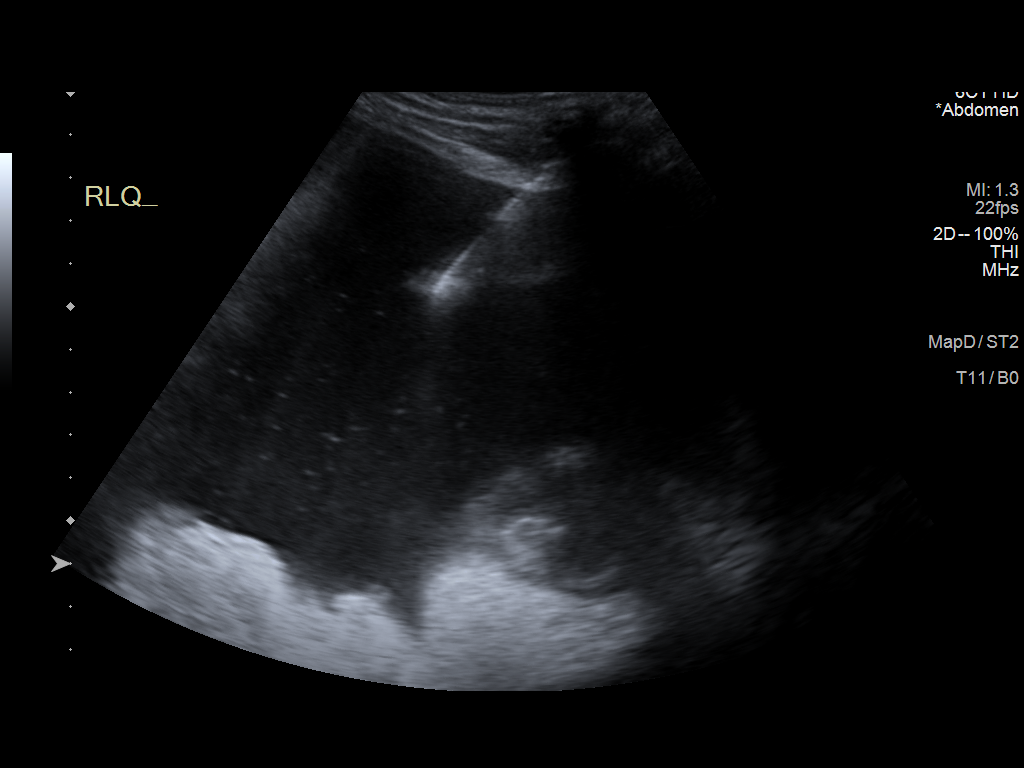
[im 5/5]
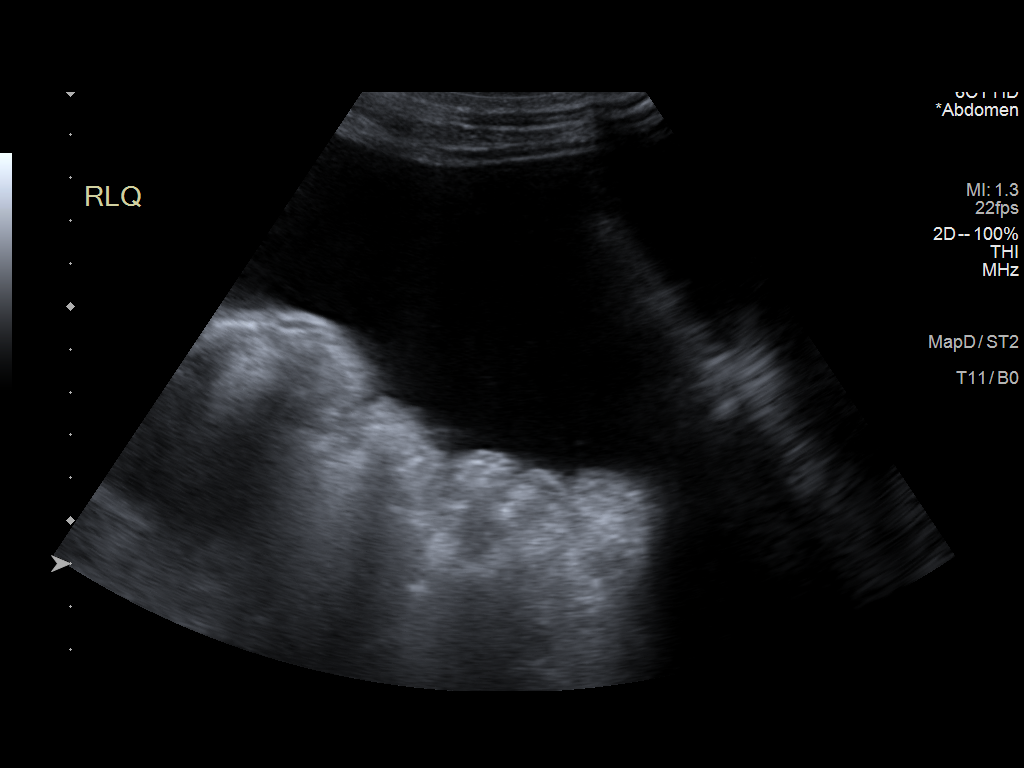

[5 of 5 positions shown; findings below may reference images not displayed]

EXAM:
ULTRASOUND GUIDED DIAGNOSTIC AND THERAPEUTIC PARACENTESIS

MEDICATIONS:
Local 1% lidocaine only.

COMPLICATIONS:
None immediate.

PROCEDURE:
Informed written consent was obtained from the patient after a
discussion of the risks, benefits and alternatives to treatment. A
timeout was performed prior to the initiation of the procedure.

Initial ultrasound scanning demonstrates a large amount of ascites
within the right lower abdominal quadrant. The right lower abdomen
was prepped and draped in the usual sterile fashion. 1% lidocaine
was used for local anesthesia.

Following this, a 6 Fr Safe-T-Centesis catheter was introduced. An
ultrasound image was saved for documentation purposes. The
paracentesis was performed. The catheter was removed and a dressing
was applied. The patient tolerated the procedure well without
immediate post procedural complication.
Patient received post-procedure intravenous albumin; see nursing
notes for details.
FINDINGS: A total of approximately 6 L of clear yellow fluid was removed.
Samples were sent to the laboratory as requested by the clinical
team.
IMPRESSION: Successful ultrasound-guided paracentesis yielding 6 liters of
peritoneal fluid.

## 2021-04-14 MED ORDER — FUROSEMIDE 20 MG PO TABS
20.0000 mg | ORAL_TABLET | Freq: Every day | ORAL | Status: DC
  Administered 2021-04-14 – 2021-04-15 (×2): 20 mg via ORAL
  Filled 2021-04-14 (×2): qty 1

## 2021-04-14 MED ORDER — OXYCODONE HCL 5 MG PO TABS
5.0000 mg | ORAL_TABLET | Freq: Four times a day (QID) | ORAL | Status: DC | PRN
Start: 2021-04-14 — End: 2021-04-15

## 2021-04-14 MED ORDER — ALBUMIN HUMAN 25 % IV SOLN
25.0000 g | Freq: Once | INTRAVENOUS | Status: AC
  Administered 2021-04-14: 18:00:00 25 g via INTRAVENOUS
  Filled 2021-04-14: qty 100

## 2021-04-14 NOTE — Progress Notes (Signed)
[place] [address] [place], [place], [place], [address] [address], [place], [place], [place], [address] Phone: [phone]  Fax: [phone]   Subjective:  Pt underwent 6 L paracentesis this morning and states he feels much better. Denies any N/V, confusion or episodes of bleeding  Objective: Exam: Vital signs in last 24 hours: Vitals:   04/13/21 1956 04/14/21 0609 04/14/21 0740 04/14/21 1217  BP: 133/90 125/84 (!) 133/95 139/89  Pulse: 84 79 79 79  Resp: 18 16 18 16   Temp: 98.2 F (36.8 C) 98.1 F (36.7 C) 97.9 F (36.6 C) 98.4 F (36.9 C)  TempSrc: Oral Oral Oral   SpO2: 97% 97% 97% 100%  Weight:      Height:       Weight change:   Intake/Output Summary (Last 24 hours) at 04/14/2021 1243 Last data filed at 04/13/2021 1500 Gross per 24 hour  Intake 530.58 ml  Output --  Net 530.58 ml    Constitutional: General:   Alert,  Well-developed, well-nourished, pleasant and cooperative in NAD BP 139/89 (BP Location: Right Arm)   Pulse 79   Temp 98.4 F (36.9 C)   Resp 16   Ht 5\' 10"  (1.778 m)   Wt 91.9 kg   SpO2 100%   BMI 29.07 kg/m   Eyes:  Sclera clear, no icterus.   Conjunctiva pink.   Ears:  No scars, lesions or masses, Normal auditory acuity. Nose:  No deformity, discharge, or lesions. Mouth:  No deformity or lesions, oropharynx pink & moist.  Neck:  Supple; no masses, trachea midline  Respiratory: Normal respiratory effort  Gastrointestinal:  Soft, non-tender, distended, without masses, hepatosplenomegaly or hernias noted.  No guarding or rebound tenderness.     Cardiac: No clubbing or edema.  No cyanosis. Normal posterior tibial pedal pulses noted.  Lymphatic:  No significant cervical adenopathy.  Psych:  Alert and cooperative. Normal mood and affect.  Musculoskeletal:  Head normocephalic, atraumatic. 5/5 Lower extremity strength bilaterally.  Skin: Warm. Intact without significant lesions or rashes. No  jaundice.  Neurologic:  Face symmetrical, tongue midline, Normal sensation to touch  Psych:  Alert and oriented x3, Alert and cooperative. Normal mood and affect.   Lab Results: Lab Results  Component Value Date   WBC 9.0 04/14/2021   HGB 13.4 04/14/2021   HCT 38.9 (L) 04/14/2021   MCV 96.3 04/14/2021   PLT 144 (L) 04/14/2021   Micro Results: Recent Results (from the past 240 hour(s))  Resp Panel by RT-PCR (Flu A&B, Covid) Nasopharyngeal Swab     Status: Abnormal   Collection Time: 04/12/21  8:58 PM   Specimen: Nasopharyngeal Swab; Nasopharyngeal(NP) swabs in vial transport medium  Result Value Ref Range Status   SARS Coronavirus 2 by RT PCR POSITIVE (A) NEGATIVE Final    Comment: RESULT CALLED TO, READ BACK BY AND VERIFIED WITH: Shuronia Pflueger @ 2154 on 04/12/2021 by caf (NOTE) SARS-CoV-2 target nucleic acids are DETECTED.  The SARS-CoV-2 RNA is generally detectable in upper respiratory specimens during the acute phase of infection. Positive results are indicative of the presence of the identified virus, but do not rule out bacterial infection or co-infection with other pathogens not detected by the test. Clinical correlation with patient history and other diagnostic information is necessary to determine patient infection status. The expected result is Negative.  Fact Sheet for Patients: EntrepreneurPulse.com.au  Fact Sheet for Healthcare Providers: IncredibleEmployment.be  This test is not yet approved or cleared by the [place]  States FDA and  has been authorized for detection and/or diagnosis of SARS-CoV-2 by FDA under an Emergency Use Authorization (EUA).  This EUA will remain in effect (meaning this  test can be used) for the duration of  the COVID-19 declaration under Section 564(b)(1) of the Act, 21 U.S.C. section 360bbb-3(b)(1), unless the authorization is terminated or revoked sooner.     Influenza A by PCR NEGATIVE  NEGATIVE Final   Influenza B by PCR NEGATIVE NEGATIVE Final    Comment: (NOTE) The Xpert Xpress SARS-CoV-2/FLU/RSV plus assay is intended as an aid in the diagnosis of influenza from Nasopharyngeal swab specimens and should not be used as a sole basis for treatment. Nasal washings and aspirates are unacceptable for Xpert Xpress SARS-CoV-2/FLU/RSV testing.  Fact Sheet for Patients: EntrepreneurPulse.com.au  Fact Sheet for Healthcare Providers: IncredibleEmployment.be  This test is not yet approved or cleared by the [place] FDA and has been authorized for detection and/or diagnosis of SARS-CoV-2 by FDA under an Emergency Use Authorization (EUA). This EUA will remain in effect (meaning this test can be used) for the duration of the COVID-19 declaration under Section 564(b)(1) of the Act, 21 U.S.C. section 360bbb-3(b)(1), unless the authorization is terminated or revoked.  Performed at [facility], [address] [place], [place] [address]    Studies/Results: DG Chest 2 View  Result Date: 04/12/2021 CLINICAL DATA:  Abdominal distension and swelling, ankle swelling, shortness of breath EXAM: CHEST - 2 VIEW COMPARISON:  None FINDINGS: Normal heart size, mediastinal contours, and pulmonary vascularity. Mild bibasilar atelectasis and decreased lung volumes. Remaining lungs clear. No acute infiltrate, pleural effusion, or pneumothorax. Osseous structures unremarkable. IMPRESSION: Decreased lung volumes with bibasilar atelectasis. Electronically Signed   By: Lavonia Dana M.D.   On: 04/12/2021 20:12   MR LIVER W WO CONTRAST  Result Date: 04/13/2021 CLINICAL DATA:  45 year old male with history of cirrhosis and possible hepatic mass noted on prior ultrasound examination. Follow-up study to further evaluate. EXAM: MRI ABDOMEN WITHOUT AND WITH CONTRAST TECHNIQUE: Multiplanar multisequence MR imaging of the abdomen was performed both before and  after the administration of intravenous contrast. CONTRAST:  32mL GADAVIST GADOBUTROL 1 MMOL/ML IV SOLN COMPARISON:  No prior abdominal MRI. Abdominal ultrasound 04/12/2021. FINDINGS: Comment: Today's study is limited by extremely poor contrast bolus, such that the examination nearly appears as a noncontrast study (although there is clearly gadolinium in the abdominal aorta and in several venous collaterals in the anterior abdominal wall). This limits the sensitivity and specificity of the examination, particularly with respect to detection and characterization of visceral and/or vascular abnormalities. Lower chest: Unremarkable. Hepatobiliary: Liver has a shrunken appearance and nodular contour indicative of underlying cirrhosis. Extending from the medial aspect of the right lobe of the liver involving segment 5 (axial image 19 of series 6 and coronal image 20 of series 3) there is a mass-like contour abnormality estimated to measure approximately 5.5 x 3.3 x 4.0 cm. However, this portion of the liver maintains signal characteristics compatible with the adjacent normal portions of the liver on all pulse sequences, without definite altered perfusion on today's limited postcontrast imaging, and with no definite restricted diffusion. No other suspicious hepatic lesions are confidently identified on today's examination. No intra or extrahepatic biliary ductal dilatation. Gallbladder is unremarkable in appearance. Pancreas: No pancreatic mass. No pancreatic ductal dilatation. No peripancreatic fluid collections. Spleen: Spleen is enlarged measuring 14.0 x 5.7 x 14.0 cm (estimated splenic volume of 559 mL) . Adrenals/Urinary Tract: Bilateral kidneys and  adrenal glands are normal in appearance. No hydroureteronephrosis in the visualized portions of the abdomen. Stomach/Bowel: Visualized portions are unremarkable. Vascular/Lymphatic: No aneurysm identified in the visualized abdominal vasculature. Portal vein is dilated  measuring 16 mm in the porta hepatis. Numerous portosystemic collateral pathways, most notable for recanalized paraumbilical vein and prominent varices in the upper anterior abdominal wall. No definite lymphadenopathy confidently identified in the abdomen. Other:  Very large volume of ascites. Musculoskeletal: No aggressive appearing osseous lesions are noted in the visualized portions of the skeleton. IMPRESSION: 1. Today's study is severely limited by suboptimal contrast bolus. The suspected mass in the right lobe of the liver appears to simply represent a focal nodular contour of the liver, as it maintains signal characteristics compatible with surrounding hepatic parenchyma on all pulse sequences. Correlation with AFP levels is recommended. If AFP is elevated, repeat abdominal MRI with and without IV gadolinium is recommended after better IV access is established. 2. Hepatic cirrhosis with very large volume of ascites. 3. Evidence of portal venous hypertension, including dilated portal vein and splenomegaly with numerous portosystemic collateral pathways. Electronically Signed   By: Vinnie Langton M.D.   On: 04/13/2021 08:48   US Venous Img Lower Bilateral (DVT)  Result Date: 04/13/2021 CLINICAL DATA:  45 year old male with a history of lower extremity edema for a month EXAM: BILATERAL LOWER EXTREMITY VENOUS DOPPLER ULTRASOUND TECHNIQUE: Gray-scale sonography with graded compression, as well as color Doppler and duplex ultrasound were performed to evaluate the lower extremity deep venous systems from the level of the common femoral vein and including the common femoral, femoral, profunda femoral, popliteal and calf veins including the posterior tibial, peroneal and gastrocnemius veins when visible. The superficial great saphenous vein was also interrogated. Spectral Doppler was utilized to evaluate flow at rest and with distal augmentation maneuvers in the common femoral, femoral and popliteal veins.  COMPARISON:  None. FINDINGS: RIGHT LOWER EXTREMITY Common Femoral Vein: No evidence of thrombus. Normal compressibility, respiratory phasicity and response to augmentation. Saphenofemoral Junction: No evidence of thrombus. Normal compressibility and flow on color Doppler imaging. Profunda Femoral Vein: No evidence of thrombus. Normal compressibility and flow on color Doppler imaging. Femoral Vein: No evidence of thrombus. Normal compressibility, respiratory phasicity and response to augmentation. Popliteal Vein: No evidence of thrombus. Normal compressibility, respiratory phasicity and response to augmentation. Calf Veins: No evidence of thrombus. Normal compressibility and flow on color Doppler imaging. Superficial Great Saphenous Vein: No evidence of thrombus. Normal compressibility and flow on color Doppler imaging. Other Findings:  None. LEFT LOWER EXTREMITY Common Femoral Vein: No evidence of thrombus. Normal compressibility, respiratory phasicity and response to augmentation. Saphenofemoral Junction: No evidence of thrombus. Normal compressibility and flow on color Doppler imaging. Profunda Femoral Vein: No evidence of thrombus. Normal compressibility and flow on color Doppler imaging. Femoral Vein: No evidence of thrombus. Normal compressibility, respiratory phasicity and response to augmentation. Popliteal Vein: No evidence of thrombus. Normal compressibility, respiratory phasicity and response to augmentation. Calf Veins: No evidence of thrombus. Normal compressibility and flow on color Doppler imaging. Superficial Great Saphenous Vein: No evidence of thrombus. Normal compressibility and flow on color Doppler imaging. Other Findings:  None. IMPRESSION: Sonographic survey of the bilateral lower extremities negative for DVT Electronically Signed   By: Corrie Mckusick D.O.   On: 04/13/2021 09:18   US Paracentesis  Result Date: 04/14/2021 INDICATION: Cirrhosis with ascites request received for paracentesis.  EXAM: ULTRASOUND GUIDED DIAGNOSTIC AND THERAPEUTIC PARACENTESIS MEDICATIONS: Local 1% lidocaine only. COMPLICATIONS: None immediate.  PROCEDURE: Informed written consent was obtained from the patient after a discussion of the risks, benefits and alternatives to treatment. A timeout was performed prior to the initiation of the procedure. Initial ultrasound scanning demonstrates a large amount of ascites within the right lower abdominal quadrant. The right lower abdomen was prepped and draped in the usual sterile fashion. 1% lidocaine was used for local anesthesia. Following this, a 6 Fr Safe-T-Centesis catheter was introduced. An ultrasound image was saved for documentation purposes. The paracentesis was performed. The catheter was removed and a dressing was applied. The patient tolerated the procedure well without immediate post procedural complication. Patient received post-procedure intravenous albumin; see nursing notes for details. FINDINGS: A total of approximately 6 L of clear yellow fluid was removed. Samples were sent to the laboratory as requested by the clinical team. IMPRESSION: Successful ultrasound-guided paracentesis yielding 6 liters of peritoneal fluid. Read By: Tsosie Billing PA-C Electronically Signed   By: Michaelle Birks M.D.   On: 04/14/2021 12:26   US ABDOMEN LIMITED RUQ (LIVER/GB)  Result Date: 04/12/2021 CLINICAL DATA:  Abdominal distension, ascites EXAM: ULTRASOUND ABDOMEN LIMITED RIGHT UPPER QUADRANT COMPARISON:  None. FINDINGS: Gallbladder: No evidence of shadowing gallstones or gallbladder sludge. Nonspecific gallbladder wall thickening measuring 4 mm likely due to adjacent ascites. Negative sonographic Murphy sign. Common bile duct: Diameter: 3 mm Liver: The liver echotexture is coarsened, with nodularity of the liver capsule, consistent with cirrhosis. Within the region of the caudate lobe there is a 4.9 by 4.8 by 3.9 cm slightly hypoechoic area, which could reflect underlying mass.  Dedicated nonemergent follow-up liver MRI recommended for further characterization. Portal vein is patent on color Doppler imaging with normal direction of blood flow towards the liver. Other: Large volume ascites identified throughout the abdomen. IMPRESSION: 1. Cirrhosis, with 4.9 cm masslike hypoechoic area in the caudate lobe. Underlying neoplasm cannot be excluded, and dedicated nonemergent liver MRI recommended for follow-up. 2. Large volume ascites. Electronically Signed   By: Randa Ngo M.D.   On: 04/12/2021 22:11   Medications:  Scheduled Meds:  vitamin C  500 mg Oral Daily   folic acid  1 mg Oral Daily   multivitamin with minerals  1 tablet Oral Daily   phytonadione  10 mg Subcutaneous Daily   spironolactone  100 mg Oral Daily   thiamine  100 mg Oral Daily   zinc sulfate  220 mg Oral Daily   Continuous Infusions: PRN Meds:.acetaminophen **OR** [DISCONTINUED] acetaminophen, magnesium hydroxide, ondansetron **OR** ondansetron (ZOFRAN) IV, oxyCODONE, traMADol, traZODone   Assessment: Active Problems:   Liver cirrhosis ([place])    Plan: 6 L of fluid was removed during paracentesis today.  With labs from the fluid pending at this time  MRI of the liver shows evidence of portal venous hypertension.  The mass in the liver noted on ultrasound is reported to be "simply represent a focal nodular contour of the liver, as it maintains signal characteristics compatible with surrounding hepatic parenchyma on all pulse sequences."  Radiologist is recommending correlation with AFP levels and if elevated repeat MRI better IV access.  We will order AFP level  Patient's hep B core antibody total was reactive, with a negative hep B core IgM antibody.  Surface antigen is negative.  Surface antibody is pending.  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  Follow-up pending labs  MRI did not show any biliary obstruction, with no intra or extrahepatic biliary ductal  dilatation.  Alcohol abstinence encouraged  Discriminant function on  04/12/2021 was 19.5.  No indication for steroid treatment at this time  Continue to monitor liver enzymes.  Avoid hepatotoxic drugs  Aldactone has been started.  Can add Lasix if electrolytes remain stable  Patient will need close follow-up in GI clinic after discharge as well and he is agreeable with the plan   LOS: 2 days   Vonda [place]igua, MD 04/14/2021, 12:43 PM

## 2021-04-14 NOTE — Progress Notes (Signed)
I was informed by nursing staff that pt did not get albumin with large volume paracentesis today. A review of MAR shows that albumin was ordered when the paracentesis orders were originally placed by Dr. Vicente Males. The albumin was meant to be given at the time of the paracentesis, but since the paracentesis did not happen yesterday when the order was placed, the pt ended up getting albumin yesterday instead of today. Since he had large volume paracentesis today, he would benefit from albumin infusion today and since the benefit of administering albumin today would outweigh risks, I have ordered a repeat dose since the previous dose was given a whole day before the paracentesis.   In addition for albumin dosing, 6-8 /L of fluid removed can be given for large volume paracentesis at one time. Therefore, per that calculation pt can receive 48 g of albumin at one time and only received 25 g yesterday. I have ordered 25 g today which is the usual dose in one bottle and would be within the recommended dosing amount.

## 2021-04-14 NOTE — Procedures (Signed)
PROCEDURE SUMMARY:  Successful US guided paracentesis from RLQ.  Yielded 6 Liters of clear yellow fluid.  No immediate complications.  Pt tolerated well.   Specimen was sent for labs.  EBL < 63mL  Hedy Jacob PA-C 04/14/2021 12:13 PM

## 2021-04-14 NOTE — Progress Notes (Signed)
Brian Vasquez  ZOX:[id number] DOB: 11-21-1975 DOA: 04/12/2021 PCP: Ellene Route    Brief Narrative:  80XB with a history of alcoholism and HTN who presented to the ED with worsening abdominal distention and discomfort over 1 to 2 weeks.  This was accompanied by dyspnea on exertion but no chest pain cough or wheezing.  He had run out of his HCTZ after recently moving to the area from [place].  In the ED he was found to have a sodium of 131 and a potassium of 3.4.  Total bilirubin was 7.1 and INR was 1.5.  He was incidentally found to be COVID-positive.  Abdominal ultrasound revealed cirrhosis and a 4.9 cm masslike area in the caudate lobe of the liver worrisome for neoplasm, as well as large volume ascites.  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  Vital signs stable.  Saturation 97% on room air. Tolerated 6L paracentesis well. Feels much better post drainage. No cp, sob, n/v, or abdom pain.   Assessment & Plan:  Alcoholic liver cirrhosis with liver failure medical management has been initiated during this admit - GI following - MRI notes portal venous HTN  Large-volume symptomatic abdominal ascites Much improved s/p 6L paracentesis today - ascites fluid studies pending   Probable prior Hep B infection Core ab reactive, core IgM negative, surface Ag negative - surface Ab pending   Hyponatremia Due to liver disease - stable at present   Hypokalemia likely due to poor nutrition in setting of alcoholism - resolved w/ supplementation - Mg is normal   Significantly elevated D-dimer venous duplex B LE w/o evidence of DVT - avoiding empiric anticoagulation as patient is already coagulopathic due to liver disease - no  clinical sx to suggest PE - do not feel further investigation indicated presently   Alcohol abuse Reports having discontinued alcohol 2 weeks ago  4.9 cm newly appreciated liver mass MRI abdomen suggests this may be distorted liver due to cirrhosis - AFP pending   Uncontrolled HTN Blood pressure controlled at this time   Incidental COVID infection No symptoms attributable to this infection at present -patient reports he was symptomatic with COVID in late August -I suspect his current positive test is in fact related to that and delayed clearance due to his severe chronic disease rather than an active COVID infection  Family Communication: spoke w/ girlfriend at bedside  Status is: Inpatient  Remains inpatient appropriate because:Inpatient level of care appropriate due to severity of illness  Dispo: The patient is from: Home              Anticipated d/c is to: Home              Patient currently is not medically stable to d/c.   Difficult to place patient No   Objective: Blood pressure (!) 133/95, pulse 79, temperature 97.9 F (36.6 C), temperature source Oral, resp. rate 18, height 5\' 10"  (1.778 m), weight 91.9 kg, SpO2 97 %.  Intake/Output Summary (Last 24 hours) at 04/14/2021 0749 Last data filed at 04/13/2021 1500 Gross per 24 hour  Intake 530.58 ml  Output --  Net 530.58 ml    Filed Weights   04/13/21 0011  Weight: 91.9 kg    Examination: General:  No acute respiratory distress Lungs: Clear to auscultation bilaterally without wheezes Cardiovascular: Regular rate and rhythm without murmur  Abdomen: Distended/protuberant but signif less so, soft, no rebound, no mass Extremities: 1+ pitting edema bilateral lower extremities  CBC: Recent Labs  Lab 04/12/21 1947 04/13/21 0215 04/14/21 0511  WBC 11.3* 9.1 9.0  NEUTROABS  --  6.1  --   HGB 14.8 14.5 13.4  HCT 41.2 40.9 38.9*  MCV 96.3 96.7 96.3  PLT 171 146* 144*    Basic Metabolic Panel: Recent Labs  Lab  04/12/21 1947 04/13/21 0215 04/13/21 0809 04/14/21 0511  NA 131* 134*  --  133*  K 3.4* 3.1*  --  4.4  CL 98 101  --  103  CO2 24 27  --  25  GLUCOSE 110* 89  --  82  BUN 7 7  --  6  CREATININE 0.70 0.63  --  0.64  CALCIUM 8.6* 8.3*  --  8.5*  MG  --   --  1.8  --     GFR: Estimated Creatinine Clearance: 131.5 mL/min (by C-G formula based on SCr of 0.64 mg/dL).  Liver Function Tests: Recent Labs  Lab 04/12/21 1947 04/13/21 0215 04/14/21 0511  AST 145* 134* 102*  ALT 78* 77* 62*  ALKPHOS 207* 190* 156*  BILITOT 7.1* 6.4* 6.5*  PROT 7.3 6.9 6.7  ALBUMIN 3.2* 2.8* 2.9*    Recent Labs  Lab 04/12/21 1947  LIPASE 60*     Coagulation Profile: Recent Labs  Lab 04/12/21 1947 04/14/21 0511  INR 1.5* 1.7*     Recent Results (from the past 240 hour(s))  Resp Panel by RT-PCR (Flu A&B, Covid) Nasopharyngeal Swab     Status: Abnormal   Collection Time: 04/12/21  8:58 PM   Specimen: Nasopharyngeal Swab; Nasopharyngeal(NP) swabs in vial transport medium  Result Value Ref Range Status   SARS Coronavirus 2 by RT PCR POSITIVE (A) NEGATIVE Final    Comment: RESULT CALLED TO, READ BACK BY AND VERIFIED WITH: Shuronia Pflueger @ 2154 on 04/12/2021 by caf (NOTE) SARS-CoV-2 target nucleic acids are DETECTED.  The SARS-CoV-2 RNA is generally detectable in upper respiratory specimens during the acute phase of infection. Positive results are indicative of the presence of the identified virus, but do not rule out bacterial infection or co-infection with other pathogens not detected by the test. Clinical correlation with patient history and other diagnostic information is necessary to determine patient infection status. The expected result is Negative.  Fact Sheet for Patients: EntrepreneurPulse.com.au  Fact Sheet for Healthcare Providers: IncredibleEmployment.be  This test is not yet approved or cleared by the [place] FDA and  has  been authorized for detection and/or diagnosis of SARS-CoV-2 by FDA under an Emergency Use Authorization (EUA).  This EUA will remain in effect (meaning this  test can be used) for the duration of  the COVID-19 declaration under Section 564(b)(1) of the Act, 21 U.S.C. section 360bbb-3(b)(1), unless the authorization is terminated or revoked sooner.     Influenza A by PCR NEGATIVE NEGATIVE Final   Influenza B by PCR NEGATIVE NEGATIVE Final    Comment: (NOTE) The Xpert Xpress SARS-CoV-2/FLU/RSV plus assay is intended as an aid in the diagnosis of influenza from Nasopharyngeal swab specimens and should not be used as a sole basis for treatment. Nasal washings and aspirates are unacceptable for Xpert Xpress SARS-CoV-2/FLU/RSV testing.  Fact Sheet for Patients: EntrepreneurPulse.com.au  Fact Sheet for Healthcare Providers: IncredibleEmployment.be  This test is not  yet approved or cleared by the [place] and has been authorized for detection and/or diagnosis of SARS-CoV-2 by FDA under an Emergency Use Authorization (EUA). This EUA will remain in effect (meaning this test can be used) for the duration of the COVID-19 declaration under Section 564(b)(1) of the Act, 21 U.S.C. section 360bbb-3(b)(1), unless the authorization is terminated or revoked.  Performed at [facility], [address] [place], [place] [address]       Scheduled Meds:  vitamin C  500 mg Oral Daily   folic acid  1 mg Oral Daily   multivitamin with minerals  1 tablet Oral Daily   phytonadione  10 mg Subcutaneous Daily   spironolactone  100 mg Oral Daily   thiamine  100 mg Oral Daily   zinc sulfate  220 mg Oral Daily      LOS: 2 days   Cherene Altes, MD [facility] Office  [phone] Pager - Text Page per Shea Evans  If 7PM-7AM, please contact night-coverage per Amion 04/14/2021, 7:49 AM

## 2021-04-15 LAB — COMPREHENSIVE METABOLIC PANEL
ALT: 57 U/L — ABNORMAL HIGH (ref 0–44)
AST: 95 U/L — ABNORMAL HIGH (ref 15–41)
Albumin: 2.9 g/dL — ABNORMAL LOW (ref 3.5–5.0)
Alkaline Phosphatase: 153 U/L — ABNORMAL HIGH (ref 38–126)
Anion gap: 5 (ref 5–15)
BUN: 6 mg/dL (ref 6–20)
CO2: 25 mmol/L (ref 22–32)
Calcium: 8.1 mg/dL — ABNORMAL LOW (ref 8.9–10.3)
Chloride: 103 mmol/L (ref 98–111)
Creatinine, Ser: 0.59 mg/dL — ABNORMAL LOW (ref 0.61–1.24)
GFR, Estimated: >60 mL/min (ref >60)
Glucose, Bld: 89 mg/dL (ref 70–99)
Potassium: 4.1 mmol/L (ref 3.5–5.1)
Sodium: 133 mmol/L — ABNORMAL LOW (ref 135–145)
Total Bilirubin: 4.1 mg/dL — ABNORMAL HIGH (ref 0.3–1.2)
Total Protein: 6 g/dL — ABNORMAL LOW (ref 6.5–8.1)

## 2021-04-15 LAB — CBC
HCT: 36.9 % — ABNORMAL LOW (ref 39.0–52.0)
Hemoglobin: 13.4 g/dL (ref 13.0–17.0)
MCH: 34.8 pg — ABNORMAL HIGH (ref 26.0–34.0)
MCHC: 36.3 g/dL — ABNORMAL HIGH (ref 30.0–36.0)
MCV: 95.8 fL (ref 80.0–100.0)
Platelets: 143 10*3/uL — ABNORMAL LOW (ref 150–400)
RBC: 3.85 MIL/uL — ABNORMAL LOW (ref 4.22–5.81)
RDW: 15.5 % (ref 11.5–15.5)
WBC: 8.6 10*3/uL (ref 4.0–10.5)
nRBC: 0 % (ref 0.0–0.2)

## 2021-04-15 LAB — AFP TUMOR MARKER: AFP, Serum, Tumor Marker: 79.5 ng/mL — ABNORMAL HIGH (ref 0.0–6.9)

## 2021-04-15 MED ORDER — THIAMINE HCL 100 MG PO TABS: 100.0000 mg | ORAL_TABLET | Freq: Every day | ORAL | Status: AC

## 2021-04-15 MED ORDER — SPIRONOLACTONE 100 MG PO TABS: 100.0000 mg | ORAL_TABLET | Freq: Every day | ORAL | 1 refills | Status: DC

## 2021-04-15 MED ORDER — FOLIC ACID 1 MG PO TABS: 1.0000 mg | ORAL_TABLET | Freq: Every day | ORAL | Status: DC

## 2021-04-15 MED ORDER — FUROSEMIDE 20 MG PO TABS: 20.0000 mg | ORAL_TABLET | Freq: Every day | ORAL | 1 refills | Status: DC

## 2021-04-15 NOTE — Discharge Summary (Signed)
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   Contact information: [place] [address] [phone]         Brian Bellows, MD. Schedule an appointment as soon as possible for a visit in 1 week(s).   Specialty: Gastroenterology Contact information: [place] [address] [phone]                 Tests Needing Follow-up: -assess tolerance of aldactone and lasix -assess ascites volume  -monitor abnormality of liver/mass with modestly elevated AFP  Discharge Diagnoses: Alcoholic liver cirrhosis with liver failure Large-volume symptomatic abdominal ascites Probable prior Hep B infection Hyponatremia Hypokalemia Elevated D-dimer Alcohol abuse 4.9 cm newly appreciated liver mass Uncontrolled HTN Incidental COVID infection    Initial presentation: 45yo with a history of alcoholism and HTN who presented to the ED with worsening abdominal distention and discomfort over 1-2 weeks.  This was accompanied by dyspnea on exertion but no chest pain cough or wheezing.  He had run out of his HCTZ after recently moving to the area from [place].  In the ED he was found to have a sodium of 131 and a potassium of 3.4.  Total bilirubin was 7.1 and INR was 1.5.  He was incidentally found to be COVID-positive (reported having a symptomatic CoViD infection in late August 2022).  Abdominal ultrasound revealed cirrhosis and a 4.9 cm masslike area in the caudate lobe of the liver worrisome for  neoplasm, as well as large volume ascites.  Hospital Course:  Alcoholic liver cirrhosis with liver failure medical management has been initiated during this admit per GI - tolerarting aldactone and lasix thus far - MRI notes portal venous HTN   Large-volume symptomatic abdominal ascites Much improved s/p 6L paracentesis 10/10 - ascites fluid studies not suggestive of SBP   Probable prior Hep B infection Core ab reactive, core IgM negative, surface Ag negative, surface Ab negative    Hyponatremia Due to liver disease - stable at present - monitor on aldactone and lasix in outpt f/u    Hypokalemia likely due to poor nutrition in setting of alcoholism - resolved w/ supplementation - Mg is normal    Significantly elevated D-dimer venous duplex B LE w/o evidence of DVT - avoided empiric anticoagulation as patient is already coagulopathic due to liver disease - no clinical sx to suggest PE - do not feel further investigation indicated presently    Alcohol abuse Reports having discontinued alcohol 2 weeks ago - no evidence of withdrawal during this admit -counseled by multiple physicians and the absolute need to fully and entirely and permanently abstain from alcohol use   4.9 cm newly appreciated liver mass MRI abdomen suggests this may be distorted liver due to cirrhosis - AFP elevated at 80 - GI to follow up as outpatient   Uncontrolled HTN Blood pressure controlled at time of discharge   Incidental COVID infection No symptoms attributable to this infection during this admission - patient reported low yes yes or are you here in the hospital now okay great  usually coming to the main lobby area [place] I will just come out there and Miechia think Get the information and leave written down here and I can fill it out just handwrite back to very good seeing you tomorrow I said yes he was symptomatic with COVID in late August - I suspect his current positive test is in fact related to that and  delayed clearance due to his severe chronic disease rather than an active COVID infection    Allergies as of 04/15/2021   No Known Allergies      Medication List     TAKE these medications    folic acid 1 MG tablet Commonly known as: FOLVITE Take 1 tablet (1 mg total) by mouth daily. Start taking on: April 16, 2021   furosemide 20 MG tablet Commonly known as: LASIX Take 1 tablet (20 mg total) by mouth daily. Start taking on: April 16, 2021   ibuprofen 200 MG tablet Commonly known as: ADVIL Take 400 mg by mouth every 6 (six) hours as needed for mild pain.   Melatonin 10 MG Tabs Take 20 mg by mouth at bedtime.   spironolactone 100 MG tablet Commonly known as: ALDACTONE Take 1 tablet (100 mg total) by mouth daily. Start taking on: April 16, 2021   thiamine 100 MG tablet Take 1 tablet (100 mg total) by mouth daily. Start taking on: April 16, 2021        Day of Discharge BP (!) 131/96 (BP Location: Right Arm)   Pulse 88   Temp 98.3 F (36.8 C)   Resp 18   Ht 5\' 10"  (1.778 m)   Wt 91.9 kg   SpO2 98%   BMI 29.07 kg/m   Physical Exam: General: No acute respiratory distress Lungs: Clear to auscultation bilaterally without wheezes or crackles Cardiovascular: Regular rate and rhythm without murmur gallop or rub normal S1 and S2 Abdomen: Nontender, nondistended, soft, bowel sounds positive, no rebound, no ascites, no appreciable mass Extremities: No significant cyanosis, clubbing, or edema bilateral lower extremities  Basic Metabolic Panel: Recent Labs  Lab 04/12/21 1947 04/13/21 0215 04/13/21 0809 04/14/21 0511 04/15/21 0523  NA 131* 134*  --  133* 133*  K 3.4* 3.1*  --  4.4 4.1  CL 98 101  --  103 103  CO2 24 27  --  25 25  GLUCOSE 110* 89  --  82 89  BUN 7 7  --  6 6  CREATININE 0.70 0.63  --  0.64 0.59*  CALCIUM 8.6* 8.3*  --  8.5* 8.1*  MG  --   --  1.8  --   --     Liver Function Tests: Recent Labs  Lab 04/12/21 1947 04/13/21 0215  04/14/21 0511 04/15/21 0523  AST 145* 134* 102* 95*  ALT 78* 77* 62* 57*  ALKPHOS 207* 190* 156* 153*  BILITOT 7.1* 6.4* 6.5* 4.1*  PROT 7.3 6.9 6.7 6.0*  ALBUMIN 3.2* 2.8* 2.9* 2.9*   Recent Labs  Lab 04/12/21 1947  LIPASE 60*    Coags: Recent Labs  Lab 04/12/21 1947 04/14/21 0511  INR 1.5* 1.7*     CBC: Recent Labs  Lab 04/12/21 1947 04/13/21 0215 04/14/21 0511 04/15/21 0523  WBC 11.3* 9.1 9.0 8.6  NEUTROABS  --  6.1  --   --   HGB 14.8 14.5 13.4 13.4  HCT 41.2 40.9 38.9* 36.9*  MCV 96.3 96.7 96.3 95.8  PLT 171 146* 144* 143*    Recent  Results (from the past 240 hour(s))  Resp Panel by RT-PCR (Flu A&B, Covid) Nasopharyngeal Swab     Status: Abnormal   Collection Time: 04/12/21  8:58 PM   Specimen: Nasopharyngeal Swab; Nasopharyngeal(NP) swabs in vial transport medium  Result Value Ref Range Status   SARS Coronavirus 2 by RT PCR POSITIVE (A) NEGATIVE Final    Comment: RESULT CALLED TO, READ BACK BY AND VERIFIED WITH: Shuronia Pflueger @ 2154 on 04/12/2021 by caf (NOTE) SARS-CoV-2 target nucleic acids are DETECTED.  The SARS-CoV-2 RNA is generally detectable in upper respiratory specimens during the acute phase of infection. Positive results are indicative of the presence of the identified virus, but do not rule out bacterial infection or co-infection with other pathogens not detected by the test. Clinical correlation with patient history and other diagnostic information is necessary to determine patient infection status. The expected result is Negative.  Fact Sheet for Patients: EntrepreneurPulse.com.au  Fact Sheet for Healthcare Providers: IncredibleEmployment.be  This test is not yet approved or cleared by the [place] FDA and  has been authorized for detection and/or diagnosis of SARS-CoV-2 by FDA under an Emergency Use Authorization (EUA).  This EUA will remain in effect (meaning this  test can be used)  for the duration of  the COVID-19 declaration under Section 564(b)(1) of the Act, 21 U.S.C. section 360bbb-3(b)(1), unless the authorization is terminated or revoked sooner.     Influenza A by PCR NEGATIVE NEGATIVE Final   Influenza B by PCR NEGATIVE NEGATIVE Final    Comment: (NOTE) The Xpert Xpress SARS-CoV-2/FLU/RSV plus assay is intended as an aid in the diagnosis of influenza from Nasopharyngeal swab specimens and should not be used as a sole basis for treatment. Nasal washings and aspirates are unacceptable for Xpert Xpress SARS-CoV-2/FLU/RSV testing.  Fact Sheet for Patients: EntrepreneurPulse.com.au  Fact Sheet for Healthcare Providers: IncredibleEmployment.be  This test is not yet approved or cleared by the [place] FDA and has been authorized for detection and/or diagnosis of SARS-CoV-2 by FDA under an Emergency Use Authorization (EUA). This EUA will remain in effect (meaning this test can be used) for the duration of the COVID-19 declaration under Section 564(b)(1) of the Act, 21 U.S.C. section 360bbb-3(b)(1), unless the authorization is terminated or revoked.  Performed at [facility], [address] [place], [place] [address]   Body fluid culture w Gram Stain     Status: None (Preliminary result)   Collection Time: 04/14/21 11:35 AM   Specimen: Peritoneal Washings  Result Value Ref Range Status   Specimen Description   Final    PERITONEAL Performed at [facility], [address], [place], [place] [address]    Special Requests   Final    NONE Performed at [facility], [address] [place], [place] [address]    Gram Stain   Final    NO SQUAMOUS EPITHELIAL CELLS SEEN FEW WBC SEEN NO ORGANISMS SEEN    Culture   Final    NO GROWTH < 24 HOURS Performed at [facility], [place] [address], [place], [place] [address]    Report Status PENDING  Incomplete      Time spent in  discharge (includes decision making & examination of pt): 35 minutes  04/15/2021, 1:50 PM   Cherene Altes, MD [facility] Office  [phone]

## 2021-04-16 LAB — HIV-1/2 AB - DIFFERENTIATION
HIV 1 Ab: NONREACTIVE
HIV 2 Ab: NONREACTIVE
Note: NEGATIVE

## 2021-04-16 LAB — HIV-1/HIV-2 QUALITATIVE RNA
Final Interpretation: NEGATIVE
HIV-1 RNA, Qualitative: NONREACTIVE
HIV-2 RNA, Qualitative: NONREACTIVE

## 2021-04-16 LAB — CYTOLOGY - NON PAP

## 2021-04-17 DIAGNOSIS — F1010 Alcohol abuse, uncomplicated: Secondary | ICD-10-CM | POA: Insufficient documentation

## 2021-04-17 DIAGNOSIS — K7031 Alcoholic cirrhosis of liver with ascites: Secondary | ICD-10-CM | POA: Insufficient documentation

## 2021-04-17 DIAGNOSIS — K766 Portal hypertension: Secondary | ICD-10-CM | POA: Insufficient documentation

## 2021-04-17 DIAGNOSIS — R160 Hepatomegaly, not elsewhere classified: Secondary | ICD-10-CM | POA: Insufficient documentation

## 2021-04-17 HISTORY — DX: Alcohol abuse, uncomplicated: F10.10

## 2021-04-17 LAB — BODY FLUID CULTURE W GRAM STAIN
Culture: NO GROWTH
Gram Stain: NONE SEEN

## 2021-04-18 LAB — HEPATITIS PANEL, ACUTE
Hep A IgM: NONREACTIVE
Hep B C IgM: NONREACTIVE
Hepatitis B Surface Ag: NONREACTIVE

## 2021-04-22 ENCOUNTER — Other Ambulatory Visit: Payer: Self-pay

## 2021-04-22 ENCOUNTER — Ambulatory Visit: Payer: 59 | Admitting: Gastroenterology

## 2021-04-22 ENCOUNTER — Encounter: Payer: Self-pay | Admitting: Gastroenterology

## 2021-04-22 VITALS — BP 165/96 | HR 105 | Temp 97.7°F | Wt 189.2 lb

## 2021-04-22 DIAGNOSIS — K7031 Alcoholic cirrhosis of liver with ascites: Secondary | ICD-10-CM

## 2021-04-22 MED ORDER — ALBUMIN HUMAN 25 % IV SOLN: 25.0000 g | Freq: Once | INTRAVENOUS | Status: DC

## 2021-04-22 NOTE — Patient Instructions (Signed)
Called and got patient appointment for paracentesis for 04/23/2021 go in  At medical mall entrance to check in for appointment

## 2021-04-22 NOTE — Progress Notes (Signed)
[name] MD, MRCP(U.K) [address]  [place]  [place], [place] [address]  Main: [phone]  Fax: [phone]   Primary Care Physician: Ellene Route  Primary Gastroenterologist:  Dr. [name]    Chief complaint : Hospital follow-up for ascites   HPI: Brian Vasquez is a 45 y.o. male   Summary of history :  I was consulted to see him on 04/13/2021 when he presented to the emergency room with abdominal distention and was found to have significant ascites.  In the ER he underwent an ultrasound which showed cirrhosis with a 4.9 cm masslike lesion in the caudate lobe.  He underwent an MRI of the liver without contrast and with contrast to look at the lesion and showed no concern for malignancy simply noted focal nodular area of the liver.  Plan was to correlate with AFP which was elevated at 79.5.  Acetic fluid showed no evidence of SBP and likely was due to portal hypertension.  INR was 1.7.  Hemoglobin 13.4.  Hepatitis B core antibody was positive hepatitis C antibody was reactive hepatitis B surface antibody was negative and hepatitis C antibody was negative surface antigen was negative  Interval history   04/13/2021-04/22/2021  He weighed 202 pounds at the hospital and presently weighs 189 pounds. He has stopped drinking all alcohol.  Has taken 1 Advil last night.  Otherwise on a very low-salt diet less than thousand 20 mg a day.  Taking Aldactone 100 mg a day and Lasix 20 mg a day.  His main complaint is abdominal distention at this point of time.   Current Outpatient Medications  Medication Sig Dispense Refill   folic acid (FOLVITE) 1 MG tablet Take 1 tablet (1 mg total) by mouth daily.     furosemide (LASIX) 20 MG tablet Take 1 tablet (20 mg total) by mouth daily. 30 tablet 1   ibuprofen (ADVIL) 200 MG tablet Take 400 mg by mouth every 6 (six) hours as needed for mild pain.     Melatonin 10 MG TABS Take 20 mg by mouth at bedtime.     spironolactone  (ALDACTONE) 100 MG tablet Take 1 tablet (100 mg total) by mouth daily. 30 tablet 1   thiamine 100 MG tablet Take 1 tablet (100 mg total) by mouth daily.     No current facility-administered medications for this visit.    Allergies as of 04/22/2021   (No Known Allergies)    ROS:  General: Negative for anorexia, weight loss, fever, chills, fatigue, weakness. ENT: Negative for hoarseness, difficulty swallowing , nasal congestion. CV: Negative for chest pain, angina, palpitations, dyspnea on exertion, peripheral edema.  Respiratory: Negative for dyspnea at rest, dyspnea on exertion, cough, sputum, wheezing.  GI: See history of present illness. GU:  Negative for dysuria, hematuria, urinary incontinence, urinary frequency, nocturnal urination.  Endo: Negative for unusual weight change.    Physical Examination:   BP (!) 165/96 (BP Location: Left Arm, Patient Position: Sitting, Cuff Size: Normal)   Pulse (!) 105   Temp 97.7 F (36.5 C) (Oral)   Wt 189 lb 3.2 oz (85.8 kg)   BMI 27.15 kg/m   General: Well-nourished, well-developed in no acute distress.  Eyes: No icterus. Conjunctivae pink. Mouth: Oropharyngeal mucosa moist and pink , no lesions erythema or exudate. Lungs: Clear to auscultation bilaterally. Non-labored. Heart: Regular rate and rhythm, no murmurs rubs or gallops.  Abdomen: Grossly distended abdomen, fluid thrill present.  No guarding no rigidity no tenderness.  Organomegaly cannot be appreciated. Extremities: No lower extremity edema. No clubbing or deformities. Neuro: Alert and oriented x 3.  Grossly intact. Skin: Warm and dry, no jaundice.   Psych: Alert and cooperative, normal mood and affect.   Imaging Studies: DG Chest 2 View  Result Date: 04/12/2021 CLINICAL DATA:  Abdominal distension and swelling, ankle swelling, shortness of breath EXAM: CHEST - 2 VIEW COMPARISON:  None FINDINGS: Normal heart size, mediastinal contours, and pulmonary vascularity. Mild  bibasilar atelectasis and decreased lung volumes. Remaining lungs clear. No acute infiltrate, pleural effusion, or pneumothorax. Osseous structures unremarkable. IMPRESSION: Decreased lung volumes with bibasilar atelectasis. Electronically Signed   By: Lavonia Dana M.D.   On: 04/12/2021 20:12   MR LIVER W WO CONTRAST  Result Date: 04/13/2021 CLINICAL DATA:  45 year old male with history of cirrhosis and possible hepatic mass noted on prior ultrasound examination. Follow-up study to further evaluate. EXAM: MRI ABDOMEN WITHOUT AND WITH CONTRAST TECHNIQUE: Multiplanar multisequence MR imaging of the abdomen was performed both before and after the administration of intravenous contrast. CONTRAST:  67mL GADAVIST GADOBUTROL 1 MMOL/ML IV SOLN COMPARISON:  No prior abdominal MRI. Abdominal ultrasound 04/12/2021. FINDINGS: Comment: Today's study is limited by extremely poor contrast bolus, such that the examination nearly appears as a noncontrast study (although there is clearly gadolinium in the abdominal aorta and in several venous collaterals in the anterior abdominal wall). This limits the sensitivity and specificity of the examination, particularly with respect to detection and characterization of visceral and/or vascular abnormalities. Lower chest: Unremarkable. Hepatobiliary: Liver has a shrunken appearance and nodular contour indicative of underlying cirrhosis. Extending from the medial aspect of the right lobe of the liver involving segment 5 (axial image 19 of series 6 and coronal image 20 of series 3) there is a mass-like contour abnormality estimated to measure approximately 5.5 x 3.3 x 4.0 cm. However, this portion of the liver maintains signal characteristics compatible with the adjacent normal portions of the liver on all pulse sequences, without definite altered perfusion on today's limited postcontrast imaging, and with no definite restricted diffusion. No other suspicious hepatic lesions are confidently  identified on today's examination. No intra or extrahepatic biliary ductal dilatation. Gallbladder is unremarkable in appearance. Pancreas: No pancreatic mass. No pancreatic ductal dilatation. No peripancreatic fluid collections. Spleen: Spleen is enlarged measuring 14.0 x 5.7 x 14.0 cm (estimated splenic volume of 559 mL) . Adrenals/Urinary Tract: Bilateral kidneys and adrenal glands are normal in appearance. No hydroureteronephrosis in the visualized portions of the abdomen. Stomach/Bowel: Visualized portions are unremarkable. Vascular/Lymphatic: No aneurysm identified in the visualized abdominal vasculature. Portal vein is dilated measuring 16 mm in the porta hepatis. Numerous portosystemic collateral pathways, most notable for recanalized paraumbilical vein and prominent varices in the upper anterior abdominal wall. No definite lymphadenopathy confidently identified in the abdomen. Other:  Very large volume of ascites. Musculoskeletal: No aggressive appearing osseous lesions are noted in the visualized portions of the skeleton. IMPRESSION: 1. Today's study is severely limited by suboptimal contrast bolus. The suspected mass in the right lobe of the liver appears to simply represent a focal nodular contour of the liver, as it maintains signal characteristics compatible with surrounding hepatic parenchyma on all pulse sequences. Correlation with AFP levels is recommended. If AFP is elevated, repeat abdominal MRI with and without IV gadolinium is recommended after better IV access is established. 2. Hepatic cirrhosis with very large volume of ascites. 3. Evidence of portal venous hypertension, including dilated portal vein and splenomegaly with numerous  portosystemic collateral pathways. Electronically Signed   By: Vinnie Langton M.D.   On: 04/13/2021 08:48   US Venous Img Lower Bilateral (DVT)  Result Date: 04/13/2021 CLINICAL DATA:  45 year old male with a history of lower extremity edema for a month EXAM:  BILATERAL LOWER EXTREMITY VENOUS DOPPLER ULTRASOUND TECHNIQUE: Gray-scale sonography with graded compression, as well as color Doppler and duplex ultrasound were performed to evaluate the lower extremity deep venous systems from the level of the common femoral vein and including the common femoral, femoral, profunda femoral, popliteal and calf veins including the posterior tibial, peroneal and gastrocnemius veins when visible. The superficial great saphenous vein was also interrogated. Spectral Doppler was utilized to evaluate flow at rest and with distal augmentation maneuvers in the common femoral, femoral and popliteal veins. COMPARISON:  None. FINDINGS: RIGHT LOWER EXTREMITY Common Femoral Vein: No evidence of thrombus. Normal compressibility, respiratory phasicity and response to augmentation. Saphenofemoral Junction: No evidence of thrombus. Normal compressibility and flow on color Doppler imaging. Profunda Femoral Vein: No evidence of thrombus. Normal compressibility and flow on color Doppler imaging. Femoral Vein: No evidence of thrombus. Normal compressibility, respiratory phasicity and response to augmentation. Popliteal Vein: No evidence of thrombus. Normal compressibility, respiratory phasicity and response to augmentation. Calf Veins: No evidence of thrombus. Normal compressibility and flow on color Doppler imaging. Superficial Great Saphenous Vein: No evidence of thrombus. Normal compressibility and flow on color Doppler imaging. Other Findings:  None. LEFT LOWER EXTREMITY Common Femoral Vein: No evidence of thrombus. Normal compressibility, respiratory phasicity and response to augmentation. Saphenofemoral Junction: No evidence of thrombus. Normal compressibility and flow on color Doppler imaging. Profunda Femoral Vein: No evidence of thrombus. Normal compressibility and flow on color Doppler imaging. Femoral Vein: No evidence of thrombus. Normal compressibility, respiratory phasicity and response to  augmentation. Popliteal Vein: No evidence of thrombus. Normal compressibility, respiratory phasicity and response to augmentation. Calf Veins: No evidence of thrombus. Normal compressibility and flow on color Doppler imaging. Superficial Great Saphenous Vein: No evidence of thrombus. Normal compressibility and flow on color Doppler imaging. Other Findings:  None. IMPRESSION: Sonographic survey of the bilateral lower extremities negative for DVT Electronically Signed   By: Corrie Mckusick D.O.   On: 04/13/2021 09:18   US Paracentesis  Result Date: 04/14/2021 INDICATION: Cirrhosis with ascites request received for paracentesis. EXAM: ULTRASOUND GUIDED DIAGNOSTIC AND THERAPEUTIC PARACENTESIS MEDICATIONS: Local 1% lidocaine only. COMPLICATIONS: None immediate. PROCEDURE: Informed written consent was obtained from the patient after a discussion of the risks, benefits and alternatives to treatment. A timeout was performed prior to the initiation of the procedure. Initial ultrasound scanning demonstrates a large amount of ascites within the right lower abdominal quadrant. The right lower abdomen was prepped and draped in the usual sterile fashion. 1% lidocaine was used for local anesthesia. Following this, a 6 Fr Safe-T-Centesis catheter was introduced. An ultrasound image was saved for documentation purposes. The paracentesis was performed. The catheter was removed and a dressing was applied. The patient tolerated the procedure well without immediate post procedural complication. Patient received post-procedure intravenous albumin; see nursing notes for details. FINDINGS: A total of approximately 6 L of clear yellow fluid was removed. Samples were sent to the laboratory as requested by the clinical team. IMPRESSION: Successful ultrasound-guided paracentesis yielding 6 liters of peritoneal fluid. Read By: Tsosie Billing PA-C Electronically Signed   By: Michaelle Birks M.D.   On: 04/14/2021 12:26   US ABDOMEN LIMITED RUQ  (LIVER/GB)  Result Date: 04/12/2021 CLINICAL DATA:  Abdominal distension, ascites EXAM: ULTRASOUND ABDOMEN LIMITED RIGHT UPPER QUADRANT COMPARISON:  None. FINDINGS: Gallbladder: No evidence of shadowing gallstones or gallbladder sludge. Nonspecific gallbladder wall thickening measuring 4 mm likely due to adjacent ascites. Negative sonographic Murphy sign. Common bile duct: Diameter: 3 mm Liver: The liver echotexture is coarsened, with nodularity of the liver capsule, consistent with cirrhosis. Within the region of the caudate lobe there is a 4.9 by 4.8 by 3.9 cm slightly hypoechoic area, which could reflect underlying mass. Dedicated nonemergent follow-up liver MRI recommended for further characterization. Portal vein is patent on color Doppler imaging with normal direction of blood flow towards the liver. Other: Large volume ascites identified throughout the abdomen. IMPRESSION: 1. Cirrhosis, with 4.9 cm masslike hypoechoic area in the caudate lobe. Underlying neoplasm cannot be excluded, and dedicated nonemergent liver MRI recommended for follow-up. 2. Large volume ascites. Electronically Signed   By: Randa Ngo M.D.   On: 04/12/2021 22:11    Assessment and Plan:   Estus Krakowski is a 45 y.o. y/o male with recent diagnosis in October 7169 with alcoholic liver cirrhosis, decompensation with ascites presented to the ER with abdominal distention and acute liver failure .  Concern for masslike lesion on ultrasound but no mass seen on MRI but AFP has been elevated  Plan 1.  No NSAIDs 2.  Check daily weights and weight is going up to give our office a call 3.  Check CMP and labs to calculate meld score.  Also check full autoimmune and genetic liver disease labs.  Once I obtain his labs will increase the dose of the diuretics 4.  Low-salt diet less than thousand 100 mg of sodium per day 5.  Elevated AFP check again if still elevated may need to repeat imaging. 6.  EGD to screen for esophageal varices   7.  Continue to stay off all alcohol, suggest to join alcoholic Anonymous 8.  Ascites: Tense we will get him an urgent paracentesis.  We will also obtain labs to calculate the SAAG.  I have discussed alternative options, risks & benefits,  which include, but are not limited to, bleeding, infection, perforation,respiratory complication & drug reaction.  The patient agrees with this plan & written consent will be obtained.    Dr [name]  MD,MRCP [facility]) Follow up in 2 to 3  weeks

## 2021-04-23 ENCOUNTER — Ambulatory Visit
Admission: RE | Admit: 2021-04-23 | Discharge: 2021-04-23 | Disposition: A | Discharge status: Home / Self Care | Payer: 59 | Source: Ambulatory Visit | Attending: Gastroenterology | Admitting: Gastroenterology

## 2021-04-23 DIAGNOSIS — K7031 Alcoholic cirrhosis of liver with ascites: Secondary | ICD-10-CM | POA: Diagnosis not present

## 2021-04-23 LAB — CBC WITH DIFFERENTIAL/PLATELET
Basophils Absolute: 0.1 10*3/uL (ref 0.0–0.2)
Basos: 1 %
EOS (ABSOLUTE): 0.2 10*3/uL (ref 0.0–0.4)
Eos: 2 %
Hematocrit: 40.2 % (ref 37.5–51.0)
Hemoglobin: 14.5 g/dL (ref 13.0–17.7)
Immature Grans (Abs): 0 10*3/uL (ref 0.0–0.1)
Immature Granulocytes: 0 %
Lymphocytes Absolute: 1.7 10*3/uL (ref 0.7–3.1)
Lymphs: 16 %
MCH: 33.4 pg — ABNORMAL HIGH (ref 26.6–33.0)
MCHC: 36.1 g/dL — ABNORMAL HIGH (ref 31.5–35.7)
MCV: 93 fL (ref 79–97)
Monocytes Absolute: 1 10*3/uL — ABNORMAL HIGH (ref 0.1–0.9)
Monocytes: 9 %
Neutrophils Absolute: 8 10*3/uL — ABNORMAL HIGH (ref 1.4–7.0)
Neutrophils: 72 %
Platelets: 149 10*3/uL — ABNORMAL LOW (ref 150–450)
RBC: 4.34 x10E6/uL (ref 4.14–5.80)
RDW: 14.1 % (ref 11.6–15.4)
WBC: 11 10*3/uL — ABNORMAL HIGH (ref 3.4–10.8)

## 2021-04-23 LAB — COMPREHENSIVE METABOLIC PANEL
ALT: 73 IU/L — ABNORMAL HIGH (ref 0–44)
AST: 117 IU/L — ABNORMAL HIGH (ref 0–40)
Albumin/Globulin Ratio: 0.9 — ABNORMAL LOW (ref 1.2–2.2)
Albumin: 3.6 g/dL — ABNORMAL LOW (ref 4.0–5.0)
Alkaline Phosphatase: 249 IU/L — ABNORMAL HIGH (ref 44–121)
BUN/Creatinine Ratio: 12 (ref 9–20)
BUN: 9 mg/dL (ref 6–24)
Bilirubin Total: 5 mg/dL — ABNORMAL HIGH (ref 0.0–1.2)
CO2: 23 mmol/L (ref 20–29)
Calcium: 9.2 mg/dL (ref 8.7–10.2)
Chloride: 96 mmol/L (ref 96–106)
Creatinine, Ser: 0.75 mg/dL — ABNORMAL LOW (ref 0.76–1.27)
Globulin, Total: 3.8 g/dL (ref 1.5–4.5)
Glucose: 104 mg/dL — ABNORMAL HIGH (ref 70–99)
Potassium: 4.4 mmol/L (ref 3.5–5.2)
Sodium: 134 mmol/L (ref 134–144)
Total Protein: 7.4 g/dL (ref 6.0–8.5)
eGFR: 113 mL/min/{1.73_m2} (ref >59)

## 2021-04-23 LAB — BODY FLUID CELL COUNT WITH DIFFERENTIAL
Lymphs, Fluid: 45 %
Monocyte-Macrophage-Serous Fluid: 54 %
Neutrophil Count, Fluid: 1 %
Total Nucleated Cell Count, Fluid: 149 cu mm

## 2021-04-23 LAB — AFP TUMOR MARKER: AFP, Serum, Tumor Marker: 63 ng/mL — ABNORMAL HIGH (ref 0.0–6.9)

## 2021-04-23 LAB — PROTEIN, PLEURAL OR PERITONEAL FLUID: Total protein, fluid: <3 g/dL

## 2021-04-23 LAB — PATHOLOGIST SMEAR REVIEW

## 2021-04-23 LAB — PROTIME-INR
INR: 1.3 — ABNORMAL HIGH (ref 0.9–1.2)
Prothrombin Time: 13.6 s — ABNORMAL HIGH (ref 9.1–12.0)

## 2021-04-23 LAB — ALBUMIN, PLEURAL OR PERITONEAL FLUID: Albumin, Fluid: <1.5 g/dL

## 2021-04-23 IMAGING — US US PARACENTESIS
1 series · 9 of 9 positions shown · non-contrast
Comparison: Ultrasound-guided paracentesis-[DATE] (yielding 6 L
of peritoneal fluid)

INDICATION: History of alcoholic cirrhosis, now with recurrent symptomatic
ascites. Please perform ultrasound-guided paracentesis for
diagnostic and therapeutic purposes.

EXAM:
ULTRASOUND-GUIDED PARACENTESIS
TECHNIQUE: Informed written consent was obtained from the patient after a
discussion of the risks, benefits and alternatives to treatment. A
timeout was performed prior to the initiation of the procedure.

[Series 1: us paracentesis · 0.28mm/px · 9 of 9 slices shown]
[im 1/9]
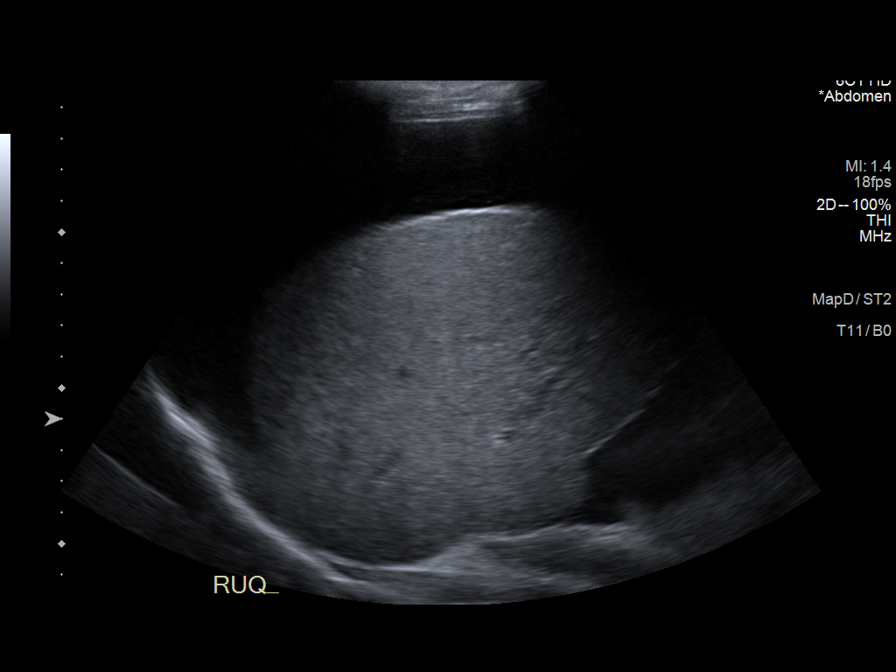
[im 2/9]
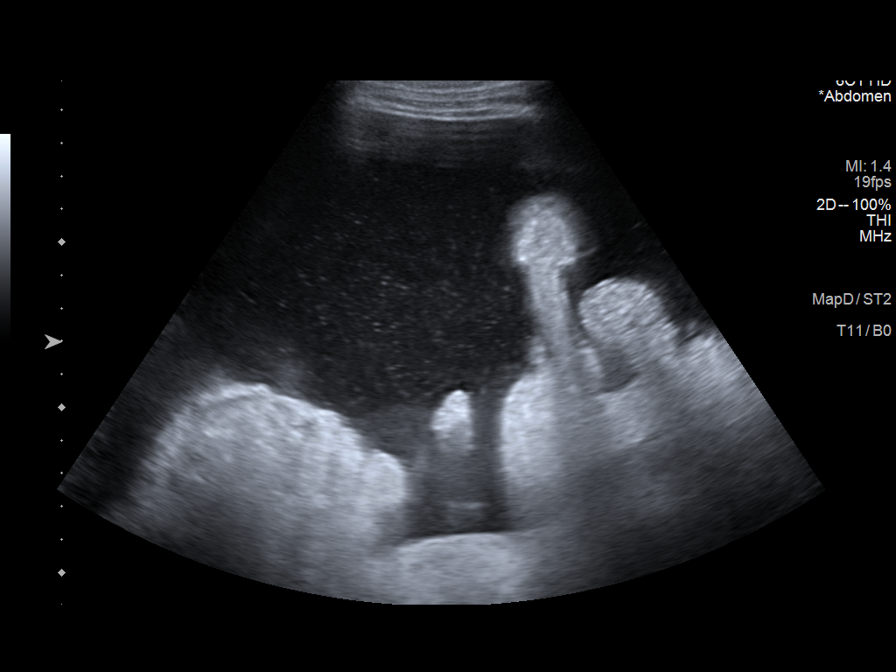
[im 3/9]
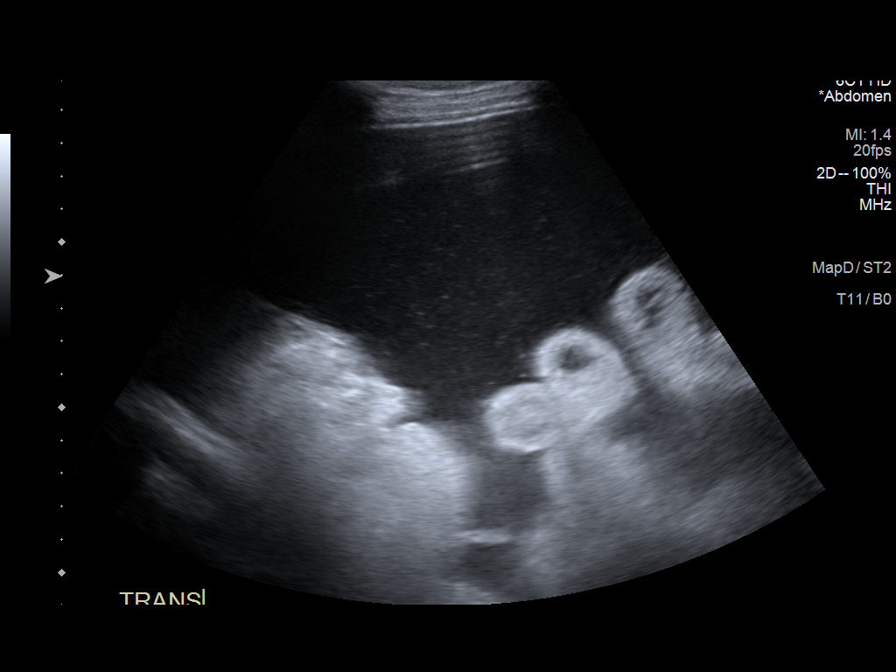
[im 4/9]
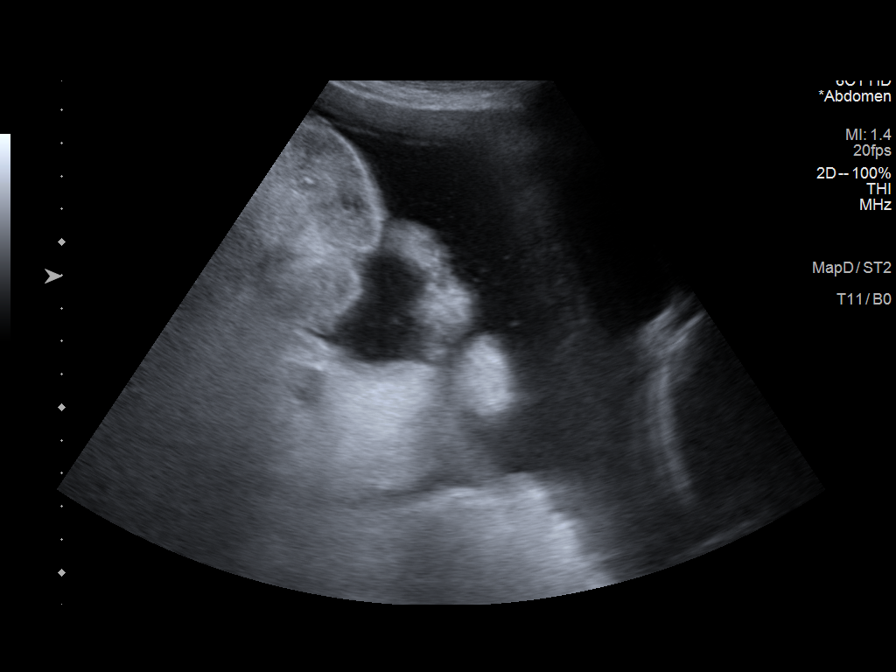
[im 5/9]
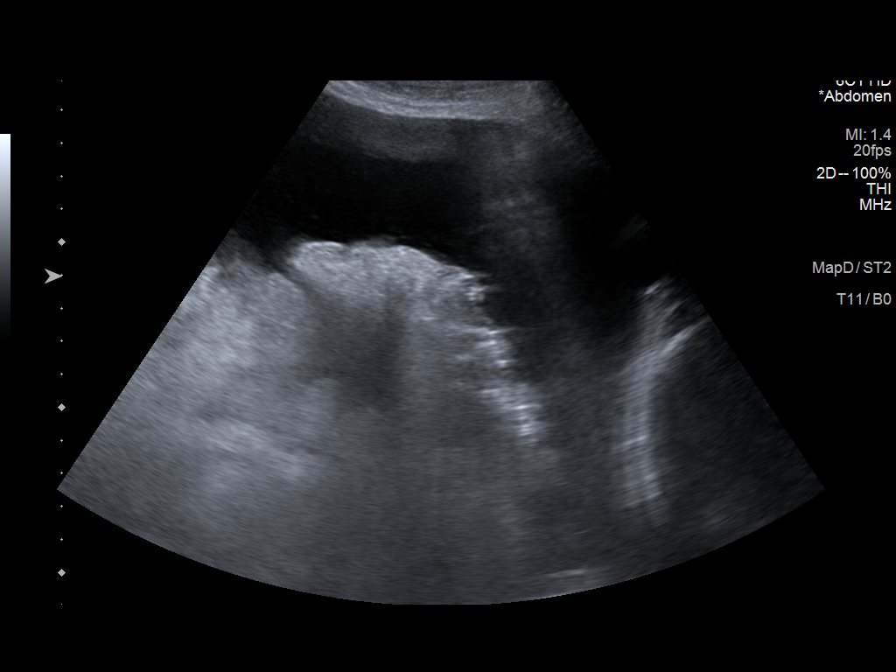
[im 6/9]
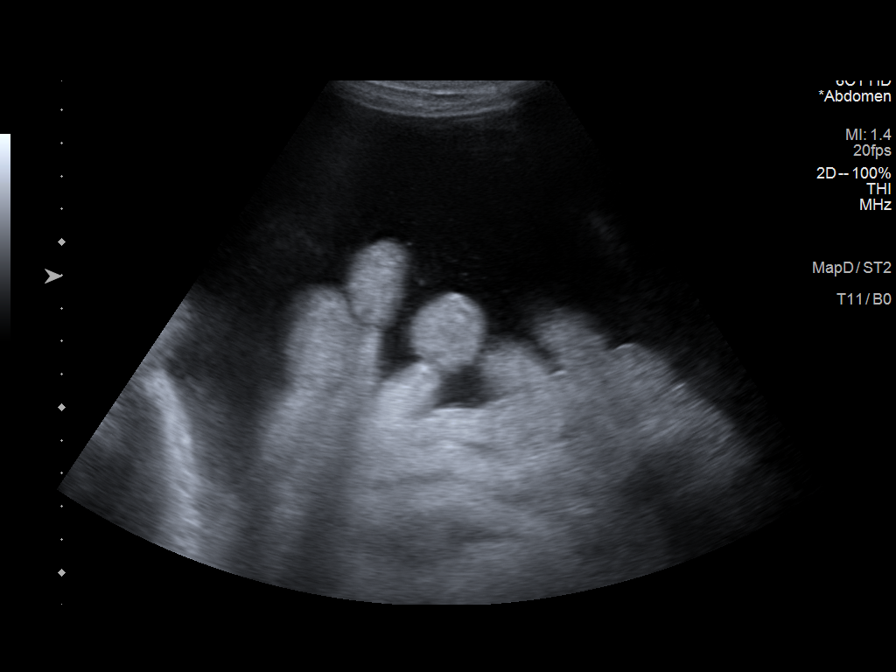
[im 7/9]
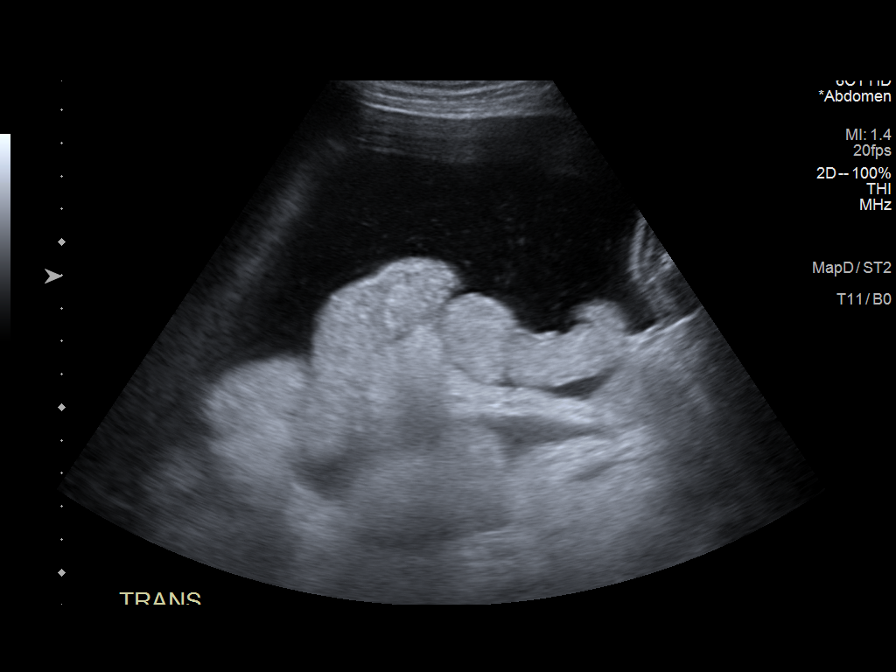
[im 8/9]
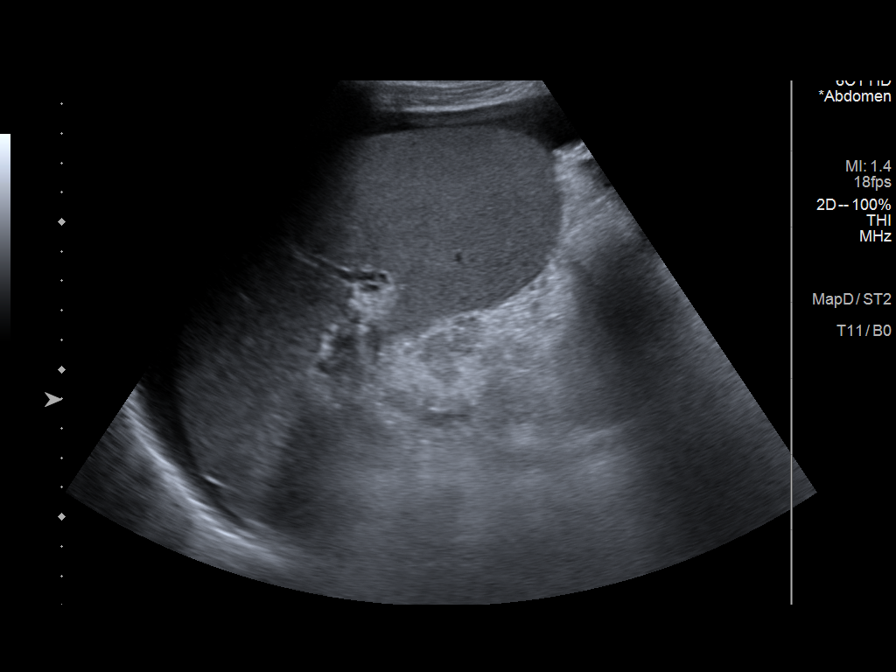
[im 9/9]
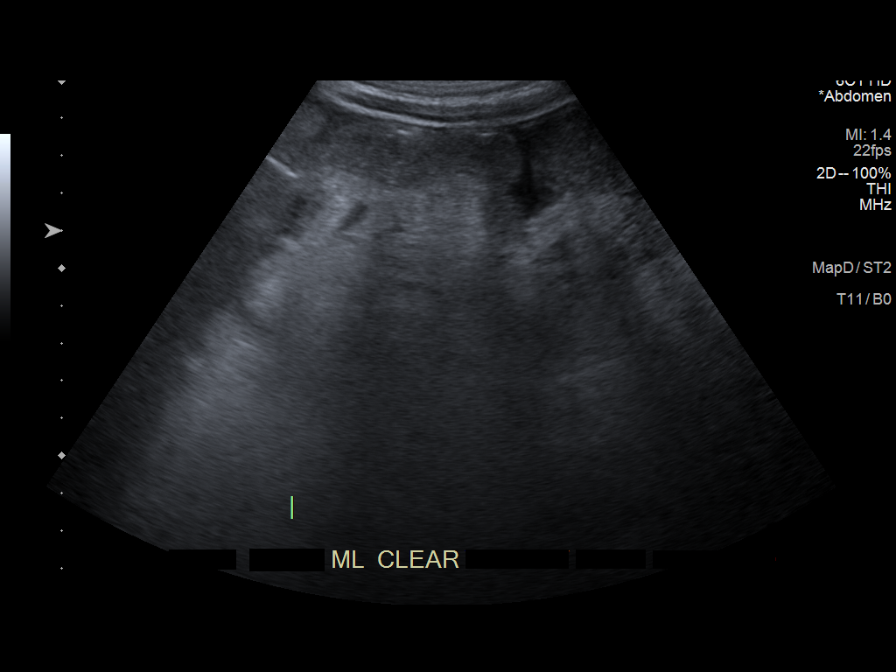

[9 of 9 positions shown; findings below may reference images not displayed]

Abdominal MRI-[DATE]; right upper quadrant abdominal
ultrasound-[DATE]

MEDICATIONS:
None.

COMPLICATIONS:
None immediate.
Initial ultrasound scanning demonstrates a large amount of ascites
within the right lower abdomen which was subsequently prepped and
draped in the usual sterile fashion. 1% lidocaine with epinephrine
was used for local anesthesia. An ultrasound image was saved for
documentation purposed. An 8 Fr Safe-T-Centesis catheter was
introduced. The paracentesis was performed. The catheter was removed
and a dressing was applied. The patient tolerated the procedure well
without immediate post procedural complication.
FINDINGS: A total of approximately 10.7 liters of serous fluid was removed.
Samples were sent to the laboratory as requested by the clinical
team.
IMPRESSION: Successful ultrasound-guided paracentesis yielding 10.7 liters of
peritoneal fluid.

## 2021-04-23 MED ORDER — ALBUMIN HUMAN 25 % IV SOLN: 25.0000 g | Freq: Once | INTRAVENOUS | Status: AC

## 2021-04-23 MED ORDER — ALBUMIN HUMAN 25 % IV SOLN
INTRAVENOUS | Status: AC
  Administered 2021-04-23: 25 g via INTRAVENOUS
  Filled 2021-04-23: qty 100

## 2021-04-23 NOTE — Procedures (Signed)
Pre Procedural Dx: Symptomatic Ascites Post Procedural Dx: Same  Successful US guided paracentesis yielding 10.7 L of serous ascitic fluid. Sample sent to laboratory as requested.  EBL: None  Complications: None immediate  Ronny Bacon, MD Pager #: [phone]

## 2021-04-24 ENCOUNTER — Telehealth: Payer: Self-pay

## 2021-04-24 DIAGNOSIS — K7031 Alcoholic cirrhosis of liver with ascites: Secondary | ICD-10-CM

## 2021-04-24 NOTE — Telephone Encounter (Signed)
-----   Message from [name], MD sent at 04/24/2021  9:56 AM EDT ----- Inform   1. Increase aldactone to 200 mg a day and continue lasix at 40 mg a day . Repeat BMP in 3-4 days  2. Check Alpha 1 anti trypsin phenotype   3. Needs Hep B vaccine

## 2021-04-25 MED ORDER — ALBUMIN HUMAN 25 % IV SOLN: 25.0000 g | Freq: Once | INTRAVENOUS | Status: DC

## 2021-04-25 NOTE — Telephone Encounter (Signed)
Dr. Vicente Males is ok for patient to get a paracentesis scheduled for his ascites. I will place the order and patient was notified and agreed with Dr. Georgeann Oppenheim decision.

## 2021-04-25 NOTE — Telephone Encounter (Signed)
Patient was contacted with Dr.Anna's recommendation and patient agreed. Patient also wanted me to let Dr.Anna know that he went to see his PCP yesterday and he recommended for him to have another paracentesis in he near future. He wanted to know if he could get an appointment to have one in a week or two just in case he accumulates fluid even by increasing his medications. I told him that I would have to ask Dr. Vicente Males and then I would get back with him. Dr. Vicente Males, can you please advise. If you decide on patient getting a paracentesis, please let me know if you are needing labs. Thank you.

## 2021-04-27 LAB — BODY FLUID CULTURE W GRAM STAIN: Culture: NO GROWTH

## 2021-04-28 ENCOUNTER — Ambulatory Visit (INDEPENDENT_AMBULATORY_CARE_PROVIDER_SITE_OTHER): Payer: Self-pay | Admitting: Gastroenterology

## 2021-04-28 ENCOUNTER — Telehealth: Payer: Self-pay

## 2021-04-28 ENCOUNTER — Other Ambulatory Visit: Payer: Self-pay

## 2021-04-28 DIAGNOSIS — Z23 Encounter for immunization: Secondary | ICD-10-CM

## 2021-04-28 NOTE — Telephone Encounter (Signed)
Patient cam in today to get his Hepatitis B vaccine and he stated that he started to have abdominal cramping again despite doing what was recommended by Dr.Anna. Patient had a paracentesis done on 04/23/2021. Please advise.

## 2021-04-29 NOTE — Progress Notes (Signed)
Still waiting on it to put ndc number I  to document 05/01/2021 still cant document vaccine

## 2021-04-29 NOTE — Telephone Encounter (Signed)
Dr. Vicente Males, so apparently there was a miscommunication with [facility] and the patient and then the message coming to me. The patient stated that he was not having any type of abdominal pain. The patient stated that he was having bilateral arm pain/cramping and right leg since the weekend. He stated that this was something new and thought that it could of been because of his cirrhosis. Patient also wanted to know if he could cancel the paracentesis that was scheduled for him since he doesn't believe that he currently needs. Please advise.

## 2021-04-30 ENCOUNTER — Other Ambulatory Visit: Payer: Self-pay

## 2021-04-30 LAB — CK: Total CK: 141 U/L (ref 49–439)

## 2021-04-30 LAB — IRON,TIBC AND FERRITIN PANEL
Ferritin: 643 ng/mL — ABNORMAL HIGH (ref 30–400)
Iron Saturation: 83 % (ref 15–55)
Iron: 147 ug/dL (ref 38–169)
Total Iron Binding Capacity: 177 ug/dL — ABNORMAL LOW (ref 250–450)
UIBC: 30 ug/dL — ABNORMAL LOW (ref 111–343)

## 2021-04-30 LAB — HEPATITIS B E ANTIGEN: Hep B E Ag: NEGATIVE

## 2021-04-30 LAB — ENDOMYSIAL IGA ANTIBODY: Endomysial IgA: NEGATIVE

## 2021-04-30 LAB — HIV 1/2 AB DIFFERENTIATION
HIV 1 Ab: NONREACTIVE
HIV 2 Ab: NONREACTIVE
NOTE (HIV CONF MULTIP: NEGATIVE

## 2021-04-30 LAB — IMMUNOGLOBULINS A/E/G/M, SERUM
IgA/Immunoglobulin A, Serum: 612 mg/dL — ABNORMAL HIGH (ref 90–386)
IgE (Immunoglobulin E), Serum: 332 IU/mL (ref 6–495)
IgG (Immunoglobin G), Serum: 1968 mg/dL — ABNORMAL HIGH (ref 603–1613)
IgM (Immunoglobulin M), Srm: 454 mg/dL — ABNORMAL HIGH (ref 20–172)

## 2021-04-30 LAB — CERULOPLASMIN: Ceruloplasmin: 23.3 mg/dL (ref 16.0–31.0)

## 2021-04-30 LAB — ALPHA-1-ANTITRYPSIN: A-1 Antitrypsin: <20 mg/dL — ABNORMAL LOW (ref 101–187)

## 2021-04-30 LAB — HIV-1/HIV-2 QUALITATIVE RNA
Final Interpretation: NEGATIVE
HIV-1 RNA, Qualitative: NONREACTIVE
HIV-2 RNA, Qualitative: NONREACTIVE

## 2021-04-30 LAB — MITOCHONDRIAL/SMOOTH MUSCLE AB PNL
Mitochondrial Ab: <20 Units (ref 0.0–20.0)
Smooth Muscle Ab: 6 Units (ref 0–19)

## 2021-04-30 LAB — HEPATITIS B E ANTIBODY: Hep B E Ab: NEGATIVE

## 2021-04-30 LAB — ANA: Anti Nuclear Antibody (ANA): NEGATIVE

## 2021-04-30 LAB — CELIAC DISEASE AB SCREEN W/RFX
Antigliadin Abs, IgA: 18 units (ref 0–19)
Transglutaminase IgA: 4 U/mL — ABNORMAL HIGH (ref 0–3)

## 2021-04-30 LAB — HEPATITIS B SURFACE ANTIBODY,QUALITATIVE: Hep B Surface Ab, Qual: NONREACTIVE

## 2021-04-30 LAB — HIV ANTIBODY (ROUTINE TESTING W REFLEX): HIV Screen 4th Generation wRfx: REACTIVE

## 2021-04-30 LAB — ANTI-MICROSOMAL ANTIBODY LIVER / KIDNEY: LKM1 Ab: 1.5 Units (ref 0.0–20.0)

## 2021-04-30 LAB — HEPATITIS A ANTIBODY, TOTAL: hep A Total Ab: POSITIVE — AB

## 2021-04-30 NOTE — Telephone Encounter (Signed)
See if abdomen is not distended please cancel paracentesis and I think I should see him a bit sooner .  Can you make sure he has an appointment to see me within the next 2 weeks please

## 2021-04-30 NOTE — Telephone Encounter (Signed)
Patient does not have any abdominal distention at this time, therefore, he will call to cancel his paracentesis. However, he continues to have some cramping on his bilateral arms and leg and just not sure why. Patient is scheduled to come in and see you on 05/19/2021 since he is not able to come in and see you sooner.

## 2021-05-05 ENCOUNTER — Other Ambulatory Visit: Payer: Self-pay

## 2021-05-05 DIAGNOSIS — K7031 Alcoholic cirrhosis of liver with ascites: Secondary | ICD-10-CM

## 2021-05-05 MED ORDER — FUROSEMIDE 20 MG PO TABS: 40.0000 mg | ORAL_TABLET | Freq: Every day | ORAL | 1 refills | Status: DC

## 2021-05-05 MED ORDER — SPIRONOLACTONE 100 MG PO TABS: 100.0000 mg | ORAL_TABLET | Freq: Two times a day (BID) | ORAL | 1 refills | Status: DC

## 2021-05-06 ENCOUNTER — Other Ambulatory Visit: Payer: Self-pay

## 2021-05-06 NOTE — Progress Notes (Signed)
Gave patient his hep b waiting for I t to get ndc in system  lot#[id number] and ndc is [phone] given right deltoid and at 1:50pm on 04/22/2021 I have emailed it several times and have three tickets sent in at this time

## 2021-05-07 ENCOUNTER — Ambulatory Visit: Payer: 59

## 2021-05-09 ENCOUNTER — Encounter: Payer: Self-pay | Admitting: Gastroenterology

## 2021-05-10 ENCOUNTER — Other Ambulatory Visit: Payer: Self-pay

## 2021-05-10 ENCOUNTER — Telehealth: Payer: Self-pay | Admitting: Gastroenterology

## 2021-05-10 ENCOUNTER — Encounter: Payer: Self-pay | Admitting: Emergency Medicine

## 2021-05-10 ENCOUNTER — Observation Stay
Admission: EM | Admit: 2021-05-10 | Discharge: 2021-05-11 | Disposition: A | Discharge status: Home / Self Care | Payer: 59 | Attending: Internal Medicine | Admitting: Internal Medicine

## 2021-05-10 ENCOUNTER — Other Ambulatory Visit
Admission: RE | Admit: 2021-05-10 | Discharge: 2021-05-10 | Disposition: A | Discharge status: Home / Self Care | Payer: 59 | Source: Home / Self Care | Attending: Gastroenterology | Admitting: Gastroenterology

## 2021-05-10 ENCOUNTER — Encounter: Payer: Self-pay | Admitting: Gastroenterology

## 2021-05-10 DIAGNOSIS — E871 Hypo-osmolality and hyponatremia: Secondary | ICD-10-CM | POA: Diagnosis present

## 2021-05-10 DIAGNOSIS — Z79899 Other long term (current) drug therapy: Secondary | ICD-10-CM | POA: Insufficient documentation

## 2021-05-10 DIAGNOSIS — I10 Essential (primary) hypertension: Secondary | ICD-10-CM | POA: Insufficient documentation

## 2021-05-10 DIAGNOSIS — Z87891 Personal history of nicotine dependence: Secondary | ICD-10-CM | POA: Insufficient documentation

## 2021-05-10 DIAGNOSIS — Z20822 Contact with and (suspected) exposure to covid-19: Secondary | ICD-10-CM | POA: Diagnosis not present

## 2021-05-10 DIAGNOSIS — K7460 Unspecified cirrhosis of liver: Secondary | ICD-10-CM | POA: Diagnosis not present

## 2021-05-10 DIAGNOSIS — E875 Hyperkalemia: Secondary | ICD-10-CM | POA: Diagnosis not present

## 2021-05-10 DIAGNOSIS — K7031 Alcoholic cirrhosis of liver with ascites: Secondary | ICD-10-CM

## 2021-05-10 DIAGNOSIS — K766 Portal hypertension: Secondary | ICD-10-CM | POA: Diagnosis present

## 2021-05-10 HISTORY — DX: Hypo-osmolality and hyponatremia: E87.1

## 2021-05-10 LAB — CBC WITH DIFFERENTIAL/PLATELET
Abs Immature Granulocytes: 0.07 10*3/uL (ref 0.00–0.07)
Basophils Absolute: 0.1 10*3/uL (ref 0.0–0.1)
Basophils Relative: 0 %
Eosinophils Absolute: 0.2 10*3/uL (ref 0.0–0.5)
Eosinophils Relative: 1 %
HCT: 38.9 % — ABNORMAL LOW (ref 39.0–52.0)
Hemoglobin: 14.5 g/dL (ref 13.0–17.0)
Immature Granulocytes: 1 %
Lymphocytes Relative: 14 %
Lymphs Abs: 1.8 10*3/uL (ref 0.7–4.0)
MCH: 33.8 pg (ref 26.0–34.0)
MCHC: 37.3 g/dL — ABNORMAL HIGH (ref 30.0–36.0)
MCV: 90.7 fL (ref 80.0–100.0)
Monocytes Absolute: 1.2 10*3/uL — ABNORMAL HIGH (ref 0.1–1.0)
Monocytes Relative: 10 %
Neutro Abs: 9.7 10*3/uL — ABNORMAL HIGH (ref 1.7–7.7)
Neutrophils Relative %: 74 %
Platelets: 196 10*3/uL (ref 150–400)
RBC: 4.29 MIL/uL (ref 4.22–5.81)
RDW: 14.2 % (ref 11.5–15.5)
WBC: 13.1 10*3/uL — ABNORMAL HIGH (ref 4.0–10.5)
nRBC: 0 % (ref 0.0–0.2)

## 2021-05-10 LAB — RESP PANEL BY RT-PCR (FLU A&B, COVID) ARPGX2
Influenza A by PCR: NEGATIVE
Influenza B by PCR: NEGATIVE
SARS Coronavirus 2 by RT PCR: NEGATIVE

## 2021-05-10 LAB — COMPREHENSIVE METABOLIC PANEL
ALT: 114 IU/L — ABNORMAL HIGH (ref 0–44)
ALT: 117 U/L — ABNORMAL HIGH (ref 0–44)
AST: 120 IU/L — ABNORMAL HIGH (ref 0–40)
AST: 119 U/L — ABNORMAL HIGH (ref 15–41)
Albumin/Globulin Ratio: 1.1 — ABNORMAL LOW (ref 1.2–2.2)
Albumin: 3.8 g/dL — ABNORMAL LOW (ref 4.0–5.0)
Albumin: 3.3 g/dL — ABNORMAL LOW (ref 3.5–5.0)
Alkaline Phosphatase: 244 IU/L — ABNORMAL HIGH (ref 44–121)
Alkaline Phosphatase: 183 U/L — ABNORMAL HIGH (ref 38–126)
Anion gap: 7 (ref 5–15)
BUN/Creatinine Ratio: 13 (ref 9–20)
BUN: 9 mg/dL (ref 6–24)
BUN: 13 mg/dL (ref 6–20)
Bilirubin Total: 3.5 mg/dL — ABNORMAL HIGH (ref 0.0–1.2)
CO2: 21 mmol/L (ref 20–29)
CO2: 22 mmol/L (ref 22–32)
Calcium: 9 mg/dL (ref 8.7–10.2)
Calcium: 8.5 mg/dL — ABNORMAL LOW (ref 8.9–10.3)
Chloride: 83 mmol/L — ABNORMAL LOW (ref 96–106)
Chloride: 87 mmol/L — ABNORMAL LOW (ref 98–111)
Creatinine, Ser: 0.68 mg/dL — ABNORMAL LOW (ref 0.76–1.27)
Creatinine, Ser: 0.62 mg/dL (ref 0.61–1.24)
GFR, Estimated: >60 mL/min (ref >60)
Globulin, Total: 3.5 g/dL (ref 1.5–4.5)
Glucose, Bld: 109 mg/dL — ABNORMAL HIGH (ref 70–99)
Glucose: 104 mg/dL — ABNORMAL HIGH (ref 70–99)
Potassium: 5.1 mmol/L (ref 3.5–5.2)
Potassium: 5.2 mmol/L — ABNORMAL HIGH (ref 3.5–5.1)
Sodium: 115 mmol/L — CL (ref 134–144)
Sodium: 116 mmol/L — CL (ref 135–145)
Total Bilirubin: 3.6 mg/dL — ABNORMAL HIGH (ref 0.3–1.2)
Total Protein: 7.3 g/dL (ref 6.0–8.5)
Total Protein: 7.5 g/dL (ref 6.5–8.1)
eGFR: 116 mL/min/{1.73_m2} (ref >59)

## 2021-05-10 LAB — BASIC METABOLIC PANEL
Anion gap: 5 (ref 5–15)
Anion gap: 9 (ref 5–15)
BUN: 13 mg/dL (ref 6–20)
BUN: 13 mg/dL (ref 6–20)
CO2: 25 mmol/L (ref 22–32)
CO2: 20 mmol/L — ABNORMAL LOW (ref 22–32)
Calcium: 8.4 mg/dL — ABNORMAL LOW (ref 8.9–10.3)
Calcium: 8.5 mg/dL — ABNORMAL LOW (ref 8.9–10.3)
Chloride: 86 mmol/L — ABNORMAL LOW (ref 98–111)
Chloride: 87 mmol/L — ABNORMAL LOW (ref 98–111)
Creatinine, Ser: 0.66 mg/dL (ref 0.61–1.24)
Creatinine, Ser: 0.65 mg/dL (ref 0.61–1.24)
GFR, Estimated: >60 mL/min (ref >60)
GFR, Estimated: >60 mL/min (ref >60)
Glucose, Bld: 122 mg/dL — ABNORMAL HIGH (ref 70–99)
Glucose, Bld: 99 mg/dL (ref 70–99)
Potassium: 5.5 mmol/L — ABNORMAL HIGH (ref 3.5–5.1)
Potassium: 5.3 mmol/L — ABNORMAL HIGH (ref 3.5–5.1)
Sodium: 116 mmol/L — CL (ref 135–145)
Sodium: 116 mmol/L — CL (ref 135–145)

## 2021-05-10 LAB — MAGNESIUM: Magnesium: 1.8 mg/dL (ref 1.7–2.4)

## 2021-05-10 LAB — PROTIME-INR
INR: 1.3 — ABNORMAL HIGH (ref 0.9–1.2)
INR: 1.5 — ABNORMAL HIGH (ref 0.8–1.2)
Prothrombin Time: 13.9 s — ABNORMAL HIGH (ref 9.1–12.0)
Prothrombin Time: 18.5 seconds — ABNORMAL HIGH (ref 11.4–15.2)

## 2021-05-10 LAB — OSMOLALITY, URINE: Osmolality, Ur: 497 mOsm/kg (ref 300–900)

## 2021-05-10 LAB — OSMOLALITY: Osmolality: 251 mOsm/kg — ABNORMAL LOW (ref 275–295)

## 2021-05-10 LAB — SODIUM, URINE, RANDOM: Sodium, Ur: 20 mmol/L

## 2021-05-10 LAB — TSH: TSH: 1.856 u[IU]/mL (ref 0.350–4.500)

## 2021-05-10 MED ORDER — OXYCODONE HCL 5 MG PO TABS: 5.0000 mg | ORAL_TABLET | ORAL | Status: DC | PRN

## 2021-05-10 MED ORDER — SODIUM CHLORIDE 0.9% FLUSH: 3.0000 mL | Freq: Two times a day (BID) | INTRAVENOUS | Status: DC

## 2021-05-10 MED ORDER — ENOXAPARIN SODIUM 40 MG/0.4ML IJ SOSY
40.0000 mg | PREFILLED_SYRINGE | Freq: Every day | INTRAMUSCULAR | Status: DC
  Administered 2021-05-11: 40 mg via SUBCUTANEOUS
  Filled 2021-05-10: qty 0.4

## 2021-05-10 MED ORDER — LACTATED RINGERS IV SOLN: INTRAVENOUS | Status: DC

## 2021-05-10 MED ORDER — INSULIN ASPART 100 UNIT/ML IJ SOLN
INTRAMUSCULAR | Status: AC
  Administered 2021-05-10: 10 [IU] via INTRAVENOUS
  Filled 2021-05-10: qty 1

## 2021-05-10 MED ORDER — ONDANSETRON HCL 4 MG PO TABS: 4.0000 mg | ORAL_TABLET | Freq: Four times a day (QID) | ORAL | Status: DC | PRN

## 2021-05-10 MED ORDER — SODIUM CHLORIDE 1 G PO TABS
1.0000 g | ORAL_TABLET | Freq: Two times a day (BID) | ORAL | Status: DC
Start: 2021-05-10 — End: 2021-05-11
  Administered 2021-05-10 – 2021-05-11 (×2): 1 g via ORAL
  Filled 2021-05-10 (×4): qty 1

## 2021-05-10 MED ORDER — ONDANSETRON HCL 4 MG/2ML IJ SOLN: 4.0000 mg | Freq: Four times a day (QID) | INTRAMUSCULAR | Status: DC | PRN

## 2021-05-10 MED ORDER — DEXTROSE 50 % IV SOLN
25.0000 g | Freq: Once | INTRAVENOUS | Status: AC
  Administered 2021-05-10: 25 g via INTRAVENOUS
  Filled 2021-05-10: qty 50

## 2021-05-10 MED ORDER — SODIUM ZIRCONIUM CYCLOSILICATE 10 G PO PACK
10.0000 g | PACK | Freq: Every day | ORAL | Status: DC
  Administered 2021-05-10 – 2021-05-11 (×2): 10 g via ORAL
  Filled 2021-05-10 (×2): qty 1

## 2021-05-10 MED ORDER — ACETAMINOPHEN 650 MG RE SUPP
650.0000 mg | Freq: Three times a day (TID) | RECTAL | Status: DC | PRN
  Filled 2021-05-10: qty 1

## 2021-05-10 MED ORDER — INSULIN ASPART 100 UNIT/ML IV SOLN
10.0000 [IU] | Freq: Once | INTRAVENOUS | Status: AC
  Filled 2021-05-10: qty 0.1

## 2021-05-10 MED ORDER — THIAMINE HCL 100 MG PO TABS
100.0000 mg | ORAL_TABLET | Freq: Every day | ORAL | Status: DC
  Administered 2021-05-10 – 2021-05-11 (×2): 100 mg via ORAL
  Filled 2021-05-10 (×2): qty 1

## 2021-05-10 MED ORDER — FOLIC ACID 1 MG PO TABS
1.0000 mg | ORAL_TABLET | Freq: Every day | ORAL | Status: DC
  Administered 2021-05-10 – 2021-05-11 (×2): 1 mg via ORAL
  Filled 2021-05-10 (×2): qty 1

## 2021-05-10 MED ORDER — TRAZODONE HCL 50 MG PO TABS: 50.0000 mg | ORAL_TABLET | Freq: Every evening | ORAL | Status: DC | PRN

## 2021-05-10 MED ORDER — SODIUM CHLORIDE 0.9 % IV SOLN
INTRAVENOUS | Status: DC
Start: 2021-05-10 — End: 2021-05-10

## 2021-05-10 MED ORDER — ACETAMINOPHEN 325 MG PO TABS: 650.0000 mg | ORAL_TABLET | Freq: Three times a day (TID) | ORAL | Status: DC | PRN

## 2021-05-10 MED ORDER — SODIUM CHLORIDE 1 G PO TABS: 1.0000 g | ORAL_TABLET | Freq: Three times a day (TID) | ORAL | Status: DC

## 2021-05-10 MED ORDER — POLYETHYLENE GLYCOL 3350 17 G PO PACK: 17.0000 g | PACK | Freq: Every day | ORAL | Status: DC | PRN

## 2021-05-10 NOTE — Consult Note (Signed)
Brian Darby, MD [address]  [place]  [place], [place] [address]  Main: [phone]  Fax: [phone] Pager: [phone]   Consultation  Referring Provider:     No ref. provider found Primary Care Physician:  [name], MD Primary Gastroenterologist:  Dr. Vicente Males         Reason for Consultation:     Hyponatremia and hypokalemia  Date of Admission:  05/10/2021 Date of Consultation:  05/10/2021         HPI:   Brian Vasquez is a 45 y.o. male with history of decompensated alcoholic cirrhosis of liver, manifested with ascites, diagnosed in 04/2021.  Patient had 2 therapeutic paracentesis done within a span of 2 weeks in October, first paracentesis 6 L removed, second paracentesis 10 L fluid was removed.  He also has alpha-1 antitrypsin deficiency, awaiting phenotype.  He has elevated AFP, MRI did not detect any liver lesions.  Due to large volume paracentesis, patient's diuretic dose has been increased to Lasix 40 mg daily, spironolactone 200 mg daily along with sodium restriction.  Patient has been consuming 64 ounce of water daily and restricting sodium intake to 1500 mg daily.  Patient had labs to monitor his kidney function and electrolytes, found to have sodium 115, chloride 83 and potassium 5.1.  I was paged last night about abnormal electrolytes and I advised patient to go to ER.  We repeated his labs this morning as he was asymptomatic, has persistently low sodium and potassium was 5.5.  Therefore, he came to the ER and currently admitted.  Patient denies abdominal distention since last paracentesis on 10/19.  He denies swelling of legs.  He lost significant amount of weight which has now plateaued.  Patient's sister is bedside.  Patient denies nausea, vomiting, fever, chills, headache, lightheadedness, melena.  He reports that he has completely stopped drinking alcohol since his diagnosis.  He works as a Education officer, museum. Patient is scheduled to undergo upper endoscopy on  Monday and he is worried if he is not going undergo this procedure due to his electrolyte abnormalities  NSAIDs: None  Antiplts/Anticoagulants/Anti thrombotics: None  GI Procedures: None  Past Medical History:  Diagnosis Date   Hypertension     History reviewed. No pertinent surgical history.  Prior to Admission medications   Medication Sig Start Date End Date Taking? Authorizing Provider  folic acid (FOLVITE) 1 MG tablet Take 1 tablet (1 mg total) by mouth daily. 04/16/21   Cherene Altes, MD  furosemide (LASIX) 20 MG tablet Take 2 tablets (40 mg total) by mouth daily. 05/05/21   [name], MD  ibuprofen (ADVIL) 200 MG tablet Take 400 mg by mouth every 6 (six) hours as needed for mild pain.    [provider]  Melatonin 10 MG TABS Take 20 mg by mouth at bedtime.    [provider]  spironolactone (ALDACTONE) 100 MG tablet Take 1 tablet (100 mg total) by mouth 2 (two) times daily. 05/05/21   [name], MD  thiamine 100 MG tablet Take 1 tablet (100 mg total) by mouth daily. 04/16/21   Cherene Altes, MD    Current Facility-Administered Medications:    0.9 %  sodium chloride infusion, , Intravenous, Continuous, Cheyna Retana, Tally Due, MD   albumin human 25 % solution 25 g, 25 g, Intravenous, Once, [name], MD   albumin human 25 % solution 25 g, 25 g, Intravenous, Once, [name], MD   sodium zirconium cyclosilicate [facility])  packet 10 g, 10 g, Oral, Daily, Rada Hay, MD, 10 g at 05/10/21 1534  Current Outpatient Medications:    folic acid (FOLVITE) 1 MG tablet, Take 1 tablet (1 mg total) by mouth daily., Disp: , Rfl:    furosemide (LASIX) 20 MG tablet, Take 2 tablets (40 mg total) by mouth daily., Disp: 60 tablet, Rfl: 1   ibuprofen (ADVIL) 200 MG tablet, Take 400 mg by mouth every 6 (six) hours as needed for mild pain., Disp: , Rfl:    Melatonin 10 MG TABS, Take 20 mg by mouth at bedtime., Disp: , Rfl:    spironolactone (ALDACTONE) 100 MG  tablet, Take 1 tablet (100 mg total) by mouth 2 (two) times daily., Disp: 60 tablet, Rfl: 1   thiamine 100 MG tablet, Take 1 tablet (100 mg total) by mouth daily., Disp: , Rfl:   No family history on file.   Social History   Tobacco Use   Smoking status: Former    Types: Cigarettes   Smokeless tobacco: Never  Vaping Use   Vaping Use: Never used  Substance Use Topics   Alcohol use: Yes    Comment: Daily- 2 liquor drinks/day   Drug use: Not Currently    Allergies as of 05/10/2021   (No Known Allergies)    Review of Systems:    All systems reviewed and negative except where noted in HPI.   Physical Exam:  Vital signs in last 24 hours: Temp:  [97.9 F (36.6 C)] 97.9 F (36.6 C) (11/05 1431) Pulse Rate:  [88] 88 (11/05 1431) Resp:  [20] 20 (11/05 1431) BP: (144)/(91) 144/91 (11/05 1431) SpO2:  [99 %] 99 % (11/05 1431) Weight:  [68 kg] 68 kg (11/05 1431)   General:   Pleasant, cooperative in NAD Head:  Normocephalic and atraumatic. Eyes:   No icterus.   Conjunctiva pink. PERRLA. Ears:  Normal auditory acuity. Neck:  Supple; no masses or thyroidomegaly Lungs: Respirations even and unlabored. Lungs clear to auscultation bilaterally.   No wheezes, crackles, or rhonchi.  Heart:  Regular rate and rhythm;  Without murmur, clicks, rubs or gallops Abdomen:  Soft, nondistended, nontender. Normal bowel sounds. No appreciable masses or hepatomegaly.  No rebound or guarding.  Rectal:  Not performed. Msk:  Symmetrical without gross deformities.  Strength normal Extremities:  Without edema, cyanosis or clubbing. Neurologic:  Alert and oriented x3;  grossly normal neurologically. Skin: Dry skin, intact without significant lesions or rashes. Cervical Nodes:  No significant cervical adenopathy. Psych:  Alert and cooperative. Normal affect.  LAB RESULTS: CBC Latest Ref Rng & Units 05/10/2021 04/22/2021 04/15/2021  WBC 4.0 - 10.5 K/uL 13.1(H) 11.0(H) 8.6  Hemoglobin 13.0 - 17.0 g/dL  14.5 14.5 13.4  Hematocrit 39.0 - 52.0 % 38.9(L) 40.2 36.9(L)  Platelets 150 - 400 K/uL 196 149(L) 143(L)    BMET BMP Latest Ref Rng & Units 05/10/2021 05/10/2021 05/09/2021  Glucose 70 - 99 mg/dL 109(H) 122(H) 101(H)  BUN 6 - 20 mg/dL $Remove'13 13 8  'CSvToJn$ Creatinine 0.61 - 1.24 mg/dL 0.62 0.66 0.67(L)  BUN/Creat Ratio 9 - 20 - - 12  Sodium 135 - 145 mmol/L 116(LL) 116(LL) 118(LL)  Potassium 3.5 - 5.1 mmol/L 5.2(H) 5.5(H) 5.1  Chloride 98 - 111 mmol/L 87(L) 86(L) 85(L)  CO2 22 - 32 mmol/L $RemoveB'22 25 22  'JZRbXSzi$ Calcium 8.9 - 10.3 mg/dL 8.5(L) 8.4(L) 8.9    LFT Hepatic Function Latest Ref Rng & Units 05/10/2021 05/09/2021 04/22/2021  Total Protein 6.5 - 8.1  g/dL 7.5 7.3 7.4  Albumin 3.5 - 5.0 g/dL 3.3(L) 3.8(L) 3.6(L)  AST 15 - 41 U/L 119(H) 120(H) 117(H)  ALT 0 - 44 U/L 117(H) 114(H) 73(H)  Alk Phosphatase 38 - 126 U/L 183(H) 244(H) 249(H)  Total Bilirubin 0.3 - 1.2 mg/dL 3.6(H) 3.5(H) 5.0(H)     STUDIES: No results found.    Impression / Plan:   Brian Vasquez is a 45 y.o. male with history of decompensated alcoholic cirrhosis with ascites s/p therapeutic paracentesis x2 in 04/2021, alpha-1 antitrypsin deficiency, mildly elevated TTG is admitted with hyponatremia and hyperkalemia likely secondary to excessive diuresis and sodium restriction as well as increased free water intake  Hyponatremia and hyperkalemia Awaiting twelve-lead EKG Patient is currently asymptomatic neurologically He appears to be hypovolemic, recommend normal saline 100 to 156ml/hr Correct hyperkalemia per protocol Hold diuretics until acute issues resolve, can restart low-dose diuretic such as Lasix 20 mg and spironolactone 50 mg at the time of discharge depending on patient's volume status Monitor electrolytes every 6-8 hours  Decompensated alcoholic cirrhosis History of ascites with therapeutic paracentesis, SAAG >1.1 consistent with portal hypertension Currently hypovolemic Recommend 2 g sodium diet EGD with patient is  clinically stable for variceal screening, can be done closer to discharge or as outpatient HRS: None PSE: None Further management of cirrhosis of liver as outpatient Patient will follow up with Dr. Vicente Males after discharge   Thank you for involving me in the care of this patient.  GI will follow along with you    LOS: 0 days   Sherri Sear, MD  05/10/2021, 4:39 PM    Note: This dictation was prepared with Dragon dictation along with smaller phrase technology. Any transcriptional errors that result from this process are unintentional.

## 2021-05-10 NOTE — ED Provider Notes (Signed)
[facility]  ____________________________________________   Event Date/Time   First MD Initiated Contact with Patient 05/10/21 1427     (approximate)  I have reviewed the triage vital signs and the nursing notes.   HISTORY  Chief Complaint abnormal labs    HPI Brian Vasquez is a 45 y.o. male with past medical history of alcohol use disorder in remission, cirrhosis with portal hypertension and ascites who presents with hyponatremia on routine blood work.  Patient follows with Dr. Vicente Males.  He has a EGD scheduled for this Monday.  He had blood work done in preparation for the procedure which showed a low sodium yesterday.  He had a repeat today which again shows low sodium and high potassium.  Patient had been taking Lasix and Aldactone and decrease his sodium intake to 1500 mg/day after his paracentesis.  At the labs came back doctor and I told him to stop the Aldactone.  I also see a note from Dr. Marius Ditch who told him to liberalize his intake.  She advised that he go to the ED.  She is asymptomatic.  He denies increase in abdomen swelling, lower extremity edema shortness of breath.  Denies nausea, vomiting, diarrhea.  He is not confused no headaches.      Past Medical History:  Diagnosis Date   Hypertension     Patient Active Problem List   Diagnosis Date Noted   Alcohol abuse 31/49/7026   Alcoholic cirrhosis of liver with ascites ([place]) 04/17/2021   Liver mass 04/17/2021   Portal venous hypertension ([place]) 04/17/2021   Liver cirrhosis ([place]) 04/12/2021    History reviewed. No pertinent surgical history.  Prior to Admission medications   Medication Sig Start Date End Date Taking? Authorizing Provider  folic acid (FOLVITE) 1 MG tablet Take 1 tablet (1 mg total) by mouth daily. 04/16/21   Cherene Altes, MD  furosemide (LASIX) 20 MG tablet Take 2 tablets (40 mg total) by mouth daily. 05/05/21   [name], MD  ibuprofen (ADVIL) 200 MG tablet Take 400  mg by mouth every 6 (six) hours as needed for mild pain.    [provider]  Melatonin 10 MG TABS Take 20 mg by mouth at bedtime.    [provider]  spironolactone (ALDACTONE) 100 MG tablet Take 1 tablet (100 mg total) by mouth 2 (two) times daily. 05/05/21   [name], MD  thiamine 100 MG tablet Take 1 tablet (100 mg total) by mouth daily. 04/16/21   Cherene Altes, MD    Allergies Patient has no known allergies.  No family history on file.  Social History Social History   Tobacco Use   Smoking status: Former    Types: Cigarettes   Smokeless tobacco: Never  Vaping Use   Vaping Use: Never used  Substance Use Topics   Alcohol use: Yes    Comment: Daily- 2 liquor drinks/day   Drug use: Not Currently    Review of Systems   Review of Systems  Constitutional:  Negative for activity change and fever.  Respiratory:  Negative for shortness of breath.   Cardiovascular:  Negative for chest pain and leg swelling.  Gastrointestinal:  Negative for abdominal pain, nausea and vomiting.  Genitourinary:  Negative for dysuria.  Neurological:  Negative for headaches.  Psychiatric/Behavioral:  Negative for confusion.   All other systems reviewed and are negative.  Physical Exam Updated Vital Signs BP (!) 144/91 (BP Location: Left Arm)   Pulse 88  Temp 97.9 F (36.6 C) (Oral)   Resp 20   Ht 5\' 10"  (1.778 m)   Wt 68 kg   SpO2 99%   BMI 21.52 kg/m   Physical Exam Vitals and nursing note reviewed.  Constitutional:      General: He is not in acute distress.    Appearance: Normal appearance.  HENT:     Head: Normocephalic and atraumatic.  Eyes:     General: No scleral icterus.    Conjunctiva/sclera: Conjunctivae normal.  Cardiovascular:     Rate and Rhythm: Normal rate and regular rhythm.  Pulmonary:     Effort: Pulmonary effort is normal. No respiratory distress.     Breath sounds: Normal breath sounds. No wheezing.  Abdominal:     General: There is  distension.     Palpations: Abdomen is soft.  Musculoskeletal:        General: No deformity or signs of injury.     Cervical back: Normal range of motion.     Right lower leg: No edema.     Left lower leg: No edema.  Skin:    Coloration: Skin is not jaundiced or pale.  Neurological:     General: No focal deficit present.     Mental Status: He is alert and oriented to person, place, and time. Mental status is at baseline.  Psychiatric:        Mood and Affect: Mood normal.        Behavior: Behavior normal.     LABS (all labs ordered are listed, but only abnormal results are displayed)  Labs Reviewed  RESP PANEL BY RT-PCR (FLU A&B, COVID) ARPGX2  COMPREHENSIVE METABOLIC PANEL  CBC WITH DIFFERENTIAL/PLATELET  PROTIME-INR  OSMOLALITY, URINE  SODIUM, URINE, RANDOM   ____________________________________________  EKG  N/a ____________________________________________  RADIOLOGY Almeta Monas, personally viewed and evaluated these images (plain radiographs) as part of my medical decision making, as well as reviewing the written report by the radiologist.  ED MD interpretation:  n/a    ____________________________________________   PROCEDURES  Procedure(s) performed (including Critical Care):  Procedures   ____________________________________________   INITIAL IMPRESSION / ASSESSMENT AND PLAN / ED COURSE     45 year old male with history of alcoholic cirrhosis presents with hyponatremia and hyperkalemia on routine outpatient labs.  Sodium from this morning is 118, potassium 5.5.  We are still waiting on EKG to be done.  Patient is asymptomatic at this time.  He appears well with stable vital signs.  He does not appear volume overloaded, abdomen is not significantly distended and he has no lower extremity swelling.  Apparently patient had been sodium restricting and has been on Lasix and Aldactone.  Discussed with GI attending who does agree with admission and  saw the patient feels that he is somewhat dehydrated recommends normal saline 100 cc/h.  We will also give dextrose, insulin and Lokelma.  Unclear exactly why he is still hyponatremic and why this happened subacutely.  He is not grossly volume overloaded not drinking alcohol.  We will send the urine studies as well.  Discussed with hospitalist who admit the patient.      ____________________________________________   FINAL CLINICAL IMPRESSION(S) / ED DIAGNOSES  Final diagnoses:  Hyponatremia  Hyperkalemia     ED Discharge Orders     None        Note:  This document was prepared using Dragon voice recognition software and may include unintentional dictation errors.    Rada Hay,  MD 05/10/21 1534

## 2021-05-10 NOTE — ED Triage Notes (Signed)
Pt reports was seen here earlier and left and was called and told to come back due to low Na and high K+ levels.

## 2021-05-10 NOTE — H&P (Signed)
[facility] History and Physical  Teagan Heidrick TLX:[id number] DOB: 1975-08-03 DOA: 05/10/2021  Referring physician: Dr. Starleen Blue PCP: [name], MD   Chief Complaint: abnormal labs  HPI: Brian Vasquez is a 45 y.o. male with history of recently diagnosed alcoholic cirrhosis who presents after findings of abnormal sodium levels on routine preprocedural labs.  Patient was recently admitted on October 8 with new diagnosis of liver cirrhosis and ascites due to alcohol.  He was discharged on October 11.  He returned to the hospital on October 19 and underwent a large-volume paracentesis with 10.7 L of ascites removed, is also given albumin during that encounter.  He is currently scheduled to undergo an EGD for variceal screening in 2 days.  As part of preop testing for this procedure he had basic lab work drawn yesterday which showed a new hyponatremia of 115, he had previously been 134 on October 18.  He presented today for repeat labs which again showed a persistent hyponatremia at 116 he was directed to the emergency department for further evaluation and management.  To me patient reports that he feels well.  He denies having any unusual symptoms.  He reports he has been compliant with his spironolactone and Lasix, he does note that these have been increased in dose recently.  He endorses noticing a substantial increase in his urination since starting these medications. He denies any confusion, weakness, muscle aches.  He has also been compliant with a 1500 mg sodium diet that was recommended by his gastroenterologist and was wondering if this could be contributing.  In the ED initial vital signs notable only for mild hypertension.  CMP showed sodium of 116, potassium 5.2, chloride 87, AST 119, ALT 117, total bilirubin 3.6, albumin 3.3, remainder unremarkable.  CBC showed mild leukocytosis to 13, otherwise unremarkable.  INR was 1.5, similar to prior.  COVID and influenza swabs were negative.   EKG was unremarkable.  Review of Systems:  Pertinent positives and negative per HPI, all others reviewed and negative  Past Medical History:  Diagnosis Date   Hypertension    History reviewed. No pertinent surgical history. Social History:  reports that he has quit smoking. His smoking use included cigarettes. He has never used smokeless tobacco. He reports current alcohol use. He reports that he does not currently use drugs.  No Known Allergies  No family history on file.   Prior to Admission medications   Medication Sig Start Date End Date Taking? Authorizing Provider  folic acid (FOLVITE) 1 MG tablet Take 1 tablet (1 mg total) by mouth daily. 04/16/21   Cherene Altes, MD  furosemide (LASIX) 20 MG tablet Take 2 tablets (40 mg total) by mouth daily. 05/05/21   [name], MD  ibuprofen (ADVIL) 200 MG tablet Take 400 mg by mouth every 6 (six) hours as needed for mild pain.    [provider]  Melatonin 10 MG TABS Take 20 mg by mouth at bedtime.    [provider]  spironolactone (ALDACTONE) 100 MG tablet Take 1 tablet (100 mg total) by mouth 2 (two) times daily. 05/05/21   [name], MD  thiamine 100 MG tablet Take 1 tablet (100 mg total) by mouth daily. 04/16/21   Cherene Altes, MD   Physical Exam: Vitals:   05/10/21 1412 05/10/21 1431  BP:  (!) 144/91  Pulse:  88  Resp:  20  Temp:  97.9 F (36.6 C)  TempSrc:  Oral  SpO2:  99%  Weight: 68  kg 68 kg  Height: 5\' 10"  (1.778 m) 5\' 10"  (1.778 m)    Wt Readings from Last 3 Encounters:  05/10/21 68 kg  04/22/21 85.8 kg  04/13/21 91.9 kg     General:  Appears calm and comfortable Eyes: PERRL, normal lids, irises & conjunctiva ENT: grossly normal hearing, lips & tongue Neck: no LAD, masses or thyromegaly Cardiovascular: RRR, no m/r/g. No LE edema. Respiratory: CTA bilaterally, no w/r/r. Normal respiratory effort. Abdomen: soft, ntnd Skin: no rash or induration seen on limited  exam Musculoskeletal: grossly normal tone BUE/BLE Psychiatric: grossly normal mood and affect, speech fluent and appropriate Neurologic: grossly non-focal.          Labs on Admission:  Basic Metabolic Panel: Recent Labs  Lab 05/09/21 0949 05/09/21 0950 05/10/21 1124 05/10/21 1539  NA 115* 118* 116* 116*  K 5.1 5.1 5.5* 5.2*  CL 83* 85* 86* 87*  CO2 21 22 25 22   GLUCOSE 104* 101* 122* 109*  BUN 9 8 13 13   CREATININE 0.68* 0.67* 0.66 0.62  CALCIUM 9.0 8.9 8.4* 8.5*  MG  --   --  1.8  --    Liver Function Tests: Recent Labs  Lab 05/09/21 0949 05/10/21 1539  AST 120* 119*  ALT 114* 117*  ALKPHOS 244* 183*  BILITOT 3.5* 3.6*  PROT 7.3 7.5  ALBUMIN 3.8* 3.3*   No results for input(s): LIPASE, AMYLASE in the last 168 hours. No results for input(s): AMMONIA in the last 168 hours. CBC: Recent Labs  Lab 05/10/21 1539  WBC 13.1*  NEUTROABS 9.7*  HGB 14.5  HCT 38.9*  MCV 90.7  PLT 196   Cardiac Enzymes: No results for input(s): CKTOTAL, CKMB, CKMBINDEX, TROPONINI in the last 168 hours.  BNP (last 3 results) No results for input(s): BNP in the last 8760 hours.  ProBNP (last 3 results) No results for input(s): PROBNP in the last 8760 hours.  CBG: No results for input(s): GLUCAP in the last 168 hours.  Radiological Exams on Admission: No results found.  EKG: Independently reviewed. Sinus, no ischemic changes, unremarkable.   Assessment/Plan Active Problems:   Liver cirrhosis (HCC)   Portal venous hypertension (HCC)   Hyponatremia   Brian Vasquez is a 45 y.o. male with history of recently diagnosed alcoholic cirrhosis who presents after findings of abnormal sodium levels on routine preprocedural labs.  #Hyponatremia Etiology not totally clear.  Patient has been uptitrated on his diuretics, not on a thiazide but has been reported with loop diuretics in the past.  Not particularly volume down on exam but at present hypovolemic hyponatremia due to diuretics  seems to be the most likely etiology.  We will do further lab work-up to evaluate. - Hold Lasix and spironolactone - Check urine osmolality and sodium - Check serum osmolality - Check TSH - Check a.m. cortisol - Trend BMP every 6 hours for time being - LR at 125 cc an hour for presumed hypovolemic hyponatremia - Salt tabs 2 times daily  #Cirrhosis Scheduled for EGD on Monday, GI aware the patient is here. - Hold Lasix and spironolactone as above - Continue folic acid and thiamine  Code Status: Full code, confirmed DVT Prophylaxis: Lovenox Family Communication: Sister updated at bedside Disposition Plan: Inpatient, MedSurg  Time spent: 11 min  Clarnce Flock MD/MP[facility]  Note:  This document was prepared using Systems analyst and may include unintentional dictation errors.

## 2021-05-10 NOTE — Telephone Encounter (Signed)
I was called last night around 1 AM to get notified about critical lab results.  His sodium was 115, potassium 5.1.  I immediately called patient and asked him if he is having any symptoms.  He denied any.  He is currently on Lasix 40 mg daily and spironolactone 100 mg twice daily.  His spironolactone dose has been increased within last 1 week.  Patient reports that he has been restricting his sodium intake to not more than 1500 mg/day.  He said he was told by Dr. Vicente Males about these recommendations due to recurrent large-volume paracentesis.  Patient reports that he did not accumulate any fluid in his abdomen since his last paracentesis.  I have advised patient to liberate his sodium restriction to 2 g/day and hold spironolactone.  He also had a bowl of chicken noodle soup last night after he spoke to him.  I also advised him to go to the ER.  He said he has to make arrangements for his dog before he can go to the ER.  Since he is asymptomatic, told him that I will repeat his BMP and magnesium on 11/5 AM.  Patient went to [facility] lab today, his serum sodium came back as 116 and potassium 5.5.  I called the patient and advised him to go to the ER right away and He agreed.  He said he also spoke to Dr. Vicente Males in the interim.  If patient gets admitted, GI needs to be notified on this patient   [name], MD [facility], [place]  [place]  [place], [place] [address]  Main: [phone]  Fax: [phone] Pager: [phone]

## 2021-05-11 ENCOUNTER — Encounter: Payer: Self-pay | Admitting: Family Medicine

## 2021-05-11 DIAGNOSIS — K766 Portal hypertension: Secondary | ICD-10-CM | POA: Diagnosis not present

## 2021-05-11 DIAGNOSIS — K7031 Alcoholic cirrhosis of liver with ascites: Secondary | ICD-10-CM

## 2021-05-11 DIAGNOSIS — E871 Hypo-osmolality and hyponatremia: Secondary | ICD-10-CM

## 2021-05-11 DIAGNOSIS — E875 Hyperkalemia: Secondary | ICD-10-CM

## 2021-05-11 LAB — CBC
HCT: 34.6 % — ABNORMAL LOW (ref 39.0–52.0)
Hemoglobin: 12.8 g/dL — ABNORMAL LOW (ref 13.0–17.0)
MCH: 33.1 pg (ref 26.0–34.0)
MCHC: 37 g/dL — ABNORMAL HIGH (ref 30.0–36.0)
MCV: 89.4 fL (ref 80.0–100.0)
Platelets: 142 10*3/uL — ABNORMAL LOW (ref 150–400)
RBC: 3.87 MIL/uL — ABNORMAL LOW (ref 4.22–5.81)
RDW: 14 % (ref 11.5–15.5)
WBC: 11.7 10*3/uL — ABNORMAL HIGH (ref 4.0–10.5)
nRBC: 0 % (ref 0.0–0.2)

## 2021-05-11 LAB — BASIC METABOLIC PANEL
Anion gap: 4 — ABNORMAL LOW (ref 5–15)
Anion gap: 5 (ref 5–15)
Anion gap: 5 (ref 5–15)
BUN: 11 mg/dL (ref 6–20)
BUN: 11 mg/dL (ref 6–20)
BUN: 9 mg/dL (ref 6–20)
CO2: 22 mmol/L (ref 22–32)
CO2: 22 mmol/L (ref 22–32)
CO2: 25 mmol/L (ref 22–32)
Calcium: 8 mg/dL — ABNORMAL LOW (ref 8.9–10.3)
Calcium: 8.1 mg/dL — ABNORMAL LOW (ref 8.9–10.3)
Calcium: 8.4 mg/dL — ABNORMAL LOW (ref 8.9–10.3)
Chloride: 90 mmol/L — ABNORMAL LOW (ref 98–111)
Chloride: 91 mmol/L — ABNORMAL LOW (ref 98–111)
Chloride: 94 mmol/L — ABNORMAL LOW (ref 98–111)
Creatinine, Ser: 0.47 mg/dL — ABNORMAL LOW (ref 0.61–1.24)
Creatinine, Ser: 0.51 mg/dL — ABNORMAL LOW (ref 0.61–1.24)
Creatinine, Ser: 0.55 mg/dL — ABNORMAL LOW (ref 0.61–1.24)
GFR, Estimated: >60 mL/min (ref >60)
GFR, Estimated: >60 mL/min (ref >60)
GFR, Estimated: >60 mL/min (ref >60)
Glucose, Bld: 106 mg/dL — ABNORMAL HIGH (ref 70–99)
Glucose, Bld: 108 mg/dL — ABNORMAL HIGH (ref 70–99)
Glucose, Bld: 112 mg/dL — ABNORMAL HIGH (ref 70–99)
Potassium: 4.4 mmol/L (ref 3.5–5.1)
Potassium: 4.5 mmol/L (ref 3.5–5.1)
Potassium: 4.6 mmol/L (ref 3.5–5.1)
Sodium: 117 mmol/L — CL (ref 135–145)
Sodium: 120 mmol/L — ABNORMAL LOW (ref 135–145)
Sodium: 121 mmol/L — ABNORMAL LOW (ref 135–145)

## 2021-05-11 LAB — CORTISOL-AM, BLOOD: Cortisol - AM: 10.2 ug/dL (ref 6.7–22.6)

## 2021-05-11 MED ORDER — SODIUM CHLORIDE 0.9 % IV SOLN: INTRAVENOUS | Status: DC

## 2021-05-11 NOTE — Progress Notes (Signed)
Pt left the floor with his mother to drive him home. He will be doing EGD outpatient tomorrow, advised NPO midnight tonight.

## 2021-05-11 NOTE — Discharge Summary (Signed)
Physician Discharge Summary  Brian Vasquez NWG:[id number] DOB: [birth year] DOA: 05/10/2021  PCP: [name], MD  Admit date: 05/10/2021 Discharge date: 05/11/2021  Admitted From: Home Disposition: Home  Recommendations for Outpatient Follow-up:  Follow up with PCP in 1-2 weeks Follow-up with GI on post discharge day #1, 11/7  Home Health: No Equipment/Devices: None  Discharge Condition: Stable CODE STATUS: Full Diet recommendation: Regular  Brief/Interim Summary: 45 y.o. male with history of recently diagnosed alcoholic cirrhosis who presents after findings of abnormal sodium levels on routine preprocedural labs.   Patient was recently admitted on October 8 with new diagnosis of liver cirrhosis and ascites due to alcohol.  He was discharged on October 11.  He returned to the hospital on October 19 and underwent a large-volume paracentesis with 10.7 L of ascites removed, is also given albumin during that encounter.  He is currently scheduled to undergo an EGD for variceal screening in 2 days.  As part of preop testing for this procedure he had basic lab work drawn yesterday which showed a new hyponatremia of 115, he had previously been 134 on October 18.  He presented today for repeat labs which again showed a persistent hyponatremia at 116 he was directed to the emergency department for further evaluation and management.   To me patient reports that he feels well.  He denies having any unusual symptoms.  He reports he has been compliant with his spironolactone and Lasix, he does note that these have been increased in dose recently.  He endorses noticing a substantial increase in his urination since starting these medications. He denies any confusion, weakness, muscle aches.  He has also been compliant with a 1500 mg sodium diet that was recommended by his gastroenterologist and was wondering if this could be contributing.   Patient was started on IV crystalloid, aggressive rate.  Responded  well.  Serum sodium increased.  Remains hyponatremic at time of discharge but asymptomatic.  Case discussed with GI.  Patient is scheduled to follow-up with GI on 11/7 (post discharge day #1) for EGD.  They will repeat labs at that time and provide recommendations regarding ongoing diuretic therapy.  At time of discharge will discontinue all diuretics as per GI instructions.  Remainder of home medications unchanged.    Discharge Diagnoses:  Active Problems:   Liver cirrhosis (HCC)   Portal venous hypertension (HCC)   Hyponatremia  Hyponatremia Etiology not totally clear.   Possibly secondary to aggressive diuresis and low-sodium diet Patient states he was on 200 of Aldactone and 40 of Lasix as outpatient Volume status appears on the dry side Patient asymptomatic Serial sodiums are improving Remains hyponatremic at time of discharge, serum sodium 120 Asymptomatic Discussed with GI, will discharge home.  All diuretics held at time of discharge.  Patient scheduled to follow-up with established gastroenterologist on post discharge day #1.  Will discuss need for ongoing diuretic therapy and strategy at that time.    #Cirrhosis Scheduled for EGD on Monday, GI consult requested No evidence of hepatic encephalopathy Plan: Diuretics on hold Folic acid and thiamine GI follow-up  Discharge Instructions  Discharge Instructions     Diet - low sodium heart healthy   Complete by: As directed    Increase activity slowly   Complete by: As directed       Allergies as of 05/11/2021   No Known Allergies      Medication List     STOP taking these medications    furosemide 20 MG  tablet Commonly known as: LASIX   spironolactone 100 MG tablet Commonly known as: ALDACTONE       TAKE these medications    folic acid 1 MG tablet Commonly known as: FOLVITE Take 1 tablet (1 mg total) by mouth daily.   ibuprofen 200 MG tablet Commonly known as: ADVIL Take 400 mg by mouth every 6  (six) hours as needed for mild pain.   Melatonin 10 MG Tabs Take 20 mg by mouth at bedtime.   thiamine 100 MG tablet Take 1 tablet (100 mg total) by mouth daily.        No Known Allergies  Consultations: GI   Procedures/Studies: DG Chest 2 View  Result Date: 04/12/2021 CLINICAL DATA:  Abdominal distension and swelling, ankle swelling, shortness of breath EXAM: CHEST - 2 VIEW COMPARISON:  None FINDINGS: Normal heart size, mediastinal contours, and pulmonary vascularity. Mild bibasilar atelectasis and decreased lung volumes. Remaining lungs clear. No acute infiltrate, pleural effusion, or pneumothorax. Osseous structures unremarkable. IMPRESSION: Decreased lung volumes with bibasilar atelectasis. Electronically Signed   By: Lavonia Dana M.D.   On: 04/12/2021 20:12   MR LIVER W WO CONTRAST  Result Date: 04/13/2021 CLINICAL DATA:  45 year old male with history of cirrhosis and possible hepatic mass noted on prior ultrasound examination. Follow-up study to further evaluate. EXAM: MRI ABDOMEN WITHOUT AND WITH CONTRAST TECHNIQUE: Multiplanar multisequence MR imaging of the abdomen was performed both before and after the administration of intravenous contrast. CONTRAST:  63mL GADAVIST GADOBUTROL 1 MMOL/ML IV SOLN COMPARISON:  No prior abdominal MRI. Abdominal ultrasound 04/12/2021. FINDINGS: Comment: Today's study is limited by extremely poor contrast bolus, such that the examination nearly appears as a noncontrast study (although there is clearly gadolinium in the abdominal aorta and in several venous collaterals in the anterior abdominal wall). This limits the sensitivity and specificity of the examination, particularly with respect to detection and characterization of visceral and/or vascular abnormalities. Lower chest: Unremarkable. Hepatobiliary: Liver has a shrunken appearance and nodular contour indicative of underlying cirrhosis. Extending from the medial aspect of the right lobe of the liver  involving segment 5 (axial image 19 of series 6 and coronal image 20 of series 3) there is a mass-like contour abnormality estimated to measure approximately 5.5 x 3.3 x 4.0 cm. However, this portion of the liver maintains signal characteristics compatible with the adjacent normal portions of the liver on all pulse sequences, without definite altered perfusion on today's limited postcontrast imaging, and with no definite restricted diffusion. No other suspicious hepatic lesions are confidently identified on today's examination. No intra or extrahepatic biliary ductal dilatation. Gallbladder is unremarkable in appearance. Pancreas: No pancreatic mass. No pancreatic ductal dilatation. No peripancreatic fluid collections. Spleen: Spleen is enlarged measuring 14.0 x 5.7 x 14.0 cm (estimated splenic volume of 559 mL) . Adrenals/Urinary Tract: Bilateral kidneys and adrenal glands are normal in appearance. No hydroureteronephrosis in the visualized portions of the abdomen. Stomach/Bowel: Visualized portions are unremarkable. Vascular/Lymphatic: No aneurysm identified in the visualized abdominal vasculature. Portal vein is dilated measuring 16 mm in the porta hepatis. Numerous portosystemic collateral pathways, most notable for recanalized paraumbilical vein and prominent varices in the upper anterior abdominal wall. No definite lymphadenopathy confidently identified in the abdomen. Other:  Very large volume of ascites. Musculoskeletal: No aggressive appearing osseous lesions are noted in the visualized portions of the skeleton. IMPRESSION: 1. Today's study is severely limited by suboptimal contrast bolus. The suspected mass in the right lobe of the liver appears to simply  represent a focal nodular contour of the liver, as it maintains signal characteristics compatible with surrounding hepatic parenchyma on all pulse sequences. Correlation with AFP levels is recommended. If AFP is elevated, repeat abdominal MRI with and  without IV gadolinium is recommended after better IV access is established. 2. Hepatic cirrhosis with very large volume of ascites. 3. Evidence of portal venous hypertension, including dilated portal vein and splenomegaly with numerous portosystemic collateral pathways. Electronically Signed   By: Vinnie Langton M.D.   On: 04/13/2021 08:48   US Venous Img Lower Bilateral (DVT)  Result Date: 04/13/2021 CLINICAL DATA:  45 year old male with a history of lower extremity edema for a month EXAM: BILATERAL LOWER EXTREMITY VENOUS DOPPLER ULTRASOUND TECHNIQUE: Gray-scale sonography with graded compression, as well as color Doppler and duplex ultrasound were performed to evaluate the lower extremity deep venous systems from the level of the common femoral vein and including the common femoral, femoral, profunda femoral, popliteal and calf veins including the posterior tibial, peroneal and gastrocnemius veins when visible. The superficial great saphenous vein was also interrogated. Spectral Doppler was utilized to evaluate flow at rest and with distal augmentation maneuvers in the common femoral, femoral and popliteal veins. COMPARISON:  None. FINDINGS: RIGHT LOWER EXTREMITY Common Femoral Vein: No evidence of thrombus. Normal compressibility, respiratory phasicity and response to augmentation. Saphenofemoral Junction: No evidence of thrombus. Normal compressibility and flow on color Doppler imaging. Profunda Femoral Vein: No evidence of thrombus. Normal compressibility and flow on color Doppler imaging. Femoral Vein: No evidence of thrombus. Normal compressibility, respiratory phasicity and response to augmentation. Popliteal Vein: No evidence of thrombus. Normal compressibility, respiratory phasicity and response to augmentation. Calf Veins: No evidence of thrombus. Normal compressibility and flow on color Doppler imaging. Superficial Great Saphenous Vein: No evidence of thrombus. Normal compressibility and flow on  color Doppler imaging. Other Findings:  None. LEFT LOWER EXTREMITY Common Femoral Vein: No evidence of thrombus. Normal compressibility, respiratory phasicity and response to augmentation. Saphenofemoral Junction: No evidence of thrombus. Normal compressibility and flow on color Doppler imaging. Profunda Femoral Vein: No evidence of thrombus. Normal compressibility and flow on color Doppler imaging. Femoral Vein: No evidence of thrombus. Normal compressibility, respiratory phasicity and response to augmentation. Popliteal Vein: No evidence of thrombus. Normal compressibility, respiratory phasicity and response to augmentation. Calf Veins: No evidence of thrombus. Normal compressibility and flow on color Doppler imaging. Superficial Great Saphenous Vein: No evidence of thrombus. Normal compressibility and flow on color Doppler imaging. Other Findings:  None. IMPRESSION: Sonographic survey of the bilateral lower extremities negative for DVT Electronically Signed   By: Corrie Mckusick D.O.   On: 04/13/2021 09:18   US Paracentesis  Result Date: 04/23/2021 INDICATION: History of alcoholic cirrhosis, now with recurrent symptomatic ascites. Please perform ultrasound-guided paracentesis for diagnostic and therapeutic purposes. EXAM: ULTRASOUND-GUIDED PARACENTESIS COMPARISON:  Ultrasound-guided paracentesis-04/14/2021 (yielding 6 L of peritoneal fluid) Abdominal MRI-04/13/2021; right upper quadrant abdominal ultrasound-04/12/2021 MEDICATIONS: None. COMPLICATIONS: None immediate. TECHNIQUE: Informed written consent was obtained from the patient after a discussion of the risks, benefits and alternatives to treatment. A timeout was performed prior to the initiation of the procedure. Initial ultrasound scanning demonstrates a large amount of ascites within the right lower abdomen which was subsequently prepped and draped in the usual sterile fashion. 1% lidocaine with epinephrine was used for local anesthesia. An ultrasound  image was saved for documentation purposed. An 8 Fr Safe-T-Centesis catheter was introduced. The paracentesis was performed. The catheter was removed and a dressing was  applied. The patient tolerated the procedure well without immediate post procedural complication. FINDINGS: A total of approximately 10.7 liters of serous fluid was removed. Samples were sent to the laboratory as requested by the clinical team. IMPRESSION: Successful ultrasound-guided paracentesis yielding 10.7 liters of peritoneal fluid. Electronically Signed   By: Sandi Mariscal M.D.   On: 04/23/2021 12:11   US Paracentesis  Result Date: 04/14/2021 INDICATION: Cirrhosis with ascites request received for paracentesis. EXAM: ULTRASOUND GUIDED DIAGNOSTIC AND THERAPEUTIC PARACENTESIS MEDICATIONS: Local 1% lidocaine only. COMPLICATIONS: None immediate. PROCEDURE: Informed written consent was obtained from the patient after a discussion of the risks, benefits and alternatives to treatment. A timeout was performed prior to the initiation of the procedure. Initial ultrasound scanning demonstrates a large amount of ascites within the right lower abdominal quadrant. The right lower abdomen was prepped and draped in the usual sterile fashion. 1% lidocaine was used for local anesthesia. Following this, a 6 Fr Safe-T-Centesis catheter was introduced. An ultrasound image was saved for documentation purposes. The paracentesis was performed. The catheter was removed and a dressing was applied. The patient tolerated the procedure well without immediate post procedural complication. Patient received post-procedure intravenous albumin; see nursing notes for details. FINDINGS: A total of approximately 6 L of clear yellow fluid was removed. Samples were sent to the laboratory as requested by the clinical team. IMPRESSION: Successful ultrasound-guided paracentesis yielding 6 liters of peritoneal fluid. Read By: Tsosie Billing PA-C Electronically Signed   By: Michaelle Birks M.D.   On: 04/14/2021 12:26   US ABDOMEN LIMITED RUQ (LIVER/GB)  Result Date: 04/12/2021 CLINICAL DATA:  Abdominal distension, ascites EXAM: ULTRASOUND ABDOMEN LIMITED RIGHT UPPER QUADRANT COMPARISON:  None. FINDINGS: Gallbladder: No evidence of shadowing gallstones or gallbladder sludge. Nonspecific gallbladder wall thickening measuring 4 mm likely due to adjacent ascites. Negative sonographic Murphy sign. Common bile duct: Diameter: 3 mm Liver: The liver echotexture is coarsened, with nodularity of the liver capsule, consistent with cirrhosis. Within the region of the caudate lobe there is a 4.9 by 4.8 by 3.9 cm slightly hypoechoic area, which could reflect underlying mass. Dedicated nonemergent follow-up liver MRI recommended for further characterization. Portal vein is patent on color Doppler imaging with normal direction of blood flow towards the liver. Other: Large volume ascites identified throughout the abdomen. IMPRESSION: 1. Cirrhosis, with 4.9 cm masslike hypoechoic area in the caudate lobe. Underlying neoplasm cannot be excluded, and dedicated nonemergent liver MRI recommended for follow-up. 2. Large volume ascites. Electronically Signed   By: Randa Ngo M.D.   On: 04/12/2021 22:11      Subjective: Seen and examined on the time of discharge.  Stable no distress.  Tolerating p.o. intake.  No pain complaints.  Stable for discharge home.  Discharge Exam: Vitals:   05/11/21 0114 05/11/21 0818  BP: 130/72 122/83  Pulse: 72 74  Resp: 20 18  Temp: 97.8 F (36.6 C) 97.8 F (36.6 C)  SpO2: 100% 100%   Vitals:   05/10/21 1431 05/10/21 2330 05/11/21 0114 05/11/21 0818  BP: (!) 144/91 122/70 130/72 122/83  Pulse: 88 71 72 74  Resp: 20 15 20 18   Temp: 97.9 F (36.6 C) 98.6 F (37 C) 97.8 F (36.6 C) 97.8 F (36.6 C)  TempSrc: Oral Oral Oral Oral  SpO2: 99% 98% 100% 100%  Weight: 68 kg     Height: 5\' 10"  (1.778 m)       General: Pt is alert, awake, not in acute  distress Cardiovascular: RRR, S1/S2 +,  no rubs, no gallops Respiratory: CTA bilaterally, no wheezing, no rhonchi Abdominal: Soft, NT, ND, bowel sounds + Extremities: no edema, no cyanosis    The results of significant diagnostics from this hospitalization (including imaging, microbiology, ancillary and laboratory) are listed below for reference.     Microbiology: Recent Results (from the past 240 hour(s))  Resp Panel by RT-PCR (Flu A&B, Covid) Nasopharyngeal Swab     Status: None   Collection Time: 05/10/21  3:20 PM   Specimen: Nasopharyngeal Swab; Nasopharyngeal(NP) swabs in vial transport medium  Result Value Ref Range Status   SARS Coronavirus 2 by RT PCR NEGATIVE NEGATIVE Final    Comment: (NOTE) SARS-CoV-2 target nucleic acids are NOT DETECTED.  The SARS-CoV-2 RNA is generally detectable in upper respiratory specimens during the acute phase of infection. The lowest concentration of SARS-CoV-2 viral copies this assay can detect is 138 copies/mL. A negative result does not preclude SARS-Cov-2 infection and should not be used as the sole basis for treatment or other patient management decisions. A negative result may occur with  improper specimen collection/handling, submission of specimen other than nasopharyngeal swab, presence of viral mutation(s) within the areas targeted by this assay, and inadequate number of viral copies(<138 copies/mL). A negative result must be combined with clinical observations, patient history, and epidemiological information. The expected result is Negative.  Fact Sheet for Patients:  EntrepreneurPulse.com.au  Fact Sheet for Healthcare Providers:  IncredibleEmployment.be  This test is no t yet approved or cleared by the [place] FDA and  has been authorized for detection and/or diagnosis of SARS-CoV-2 by FDA under an Emergency Use Authorization (EUA). This EUA will remain  in effect (meaning this test  can be used) for the duration of the COVID-19 declaration under Section 564(b)(1) of the Act, 21 U.S.C.section 360bbb-3(b)(1), unless the authorization is terminated  or revoked sooner.       Influenza A by PCR NEGATIVE NEGATIVE Final   Influenza B by PCR NEGATIVE NEGATIVE Final    Comment: (NOTE) The Xpert Xpress SARS-CoV-2/FLU/RSV plus assay is intended as an aid in the diagnosis of influenza from Nasopharyngeal swab specimens and should not be used as a sole basis for treatment. Nasal washings and aspirates are unacceptable for Xpert Xpress SARS-CoV-2/FLU/RSV testing.  Fact Sheet for Patients: EntrepreneurPulse.com.au  Fact Sheet for Healthcare Providers: IncredibleEmployment.be  This test is not yet approved or cleared by the [place] FDA and has been authorized for detection and/or diagnosis of SARS-CoV-2 by FDA under an Emergency Use Authorization (EUA). This EUA will remain in effect (meaning this test can be used) for the duration of the COVID-19 declaration under Section 564(b)(1) of the Act, 21 U.S.C. section 360bbb-3(b)(1), unless the authorization is terminated or revoked.  Performed at [facility], [address] [place], [place] [address]      Labs: BNP (last 3 results) No results for input(s): BNP in the last 8760 hours. Basic Metabolic Panel: Recent Labs  Lab 05/10/21 1124 05/10/21 1539 05/10/21 1800 05/11/21 0014 05/11/21 0448 05/11/21 1132  NA 116* 116* 116* 117* 120* 121*  K 5.5* 5.2* 5.3* 4.6 4.4 4.5  CL 86* 87* 87* 90* 94* 91*  CO2 25 22 20* 22 22 25   GLUCOSE 122* 109* 99 112* 106* 108*  BUN 13 13 13 11 11 9   CREATININE 0.66 0.62 0.65 0.55* 0.47* 0.51*  CALCIUM 8.4* 8.5* 8.5* 8.1* 8.0* 8.4*  MG 1.8  --   --   --   --   --  Liver Function Tests: Recent Labs  Lab 05/09/21 0949 05/10/21 1539  AST 120* 119*  ALT 114* 117*  ALKPHOS 244* 183*  BILITOT 3.5* 3.6*  PROT 7.3 7.5   ALBUMIN 3.8* 3.3*   No results for input(s): LIPASE, AMYLASE in the last 168 hours. No results for input(s): AMMONIA in the last 168 hours. CBC: Recent Labs  Lab 05/10/21 1539 05/11/21 0448  WBC 13.1* 11.7*  NEUTROABS 9.7*  --   HGB 14.5 12.8*  HCT 38.9* 34.6*  MCV 90.7 89.4  PLT 196 142*   Cardiac Enzymes: No results for input(s): CKTOTAL, CKMB, CKMBINDEX, TROPONINI in the last 168 hours. BNP: Invalid input(s): POCBNP CBG: No results for input(s): GLUCAP in the last 168 hours. D-Dimer No results for input(s): DDIMER in the last 72 hours. Hgb A1c No results for input(s): HGBA1C in the last 72 hours. Lipid Profile No results for input(s): CHOL, HDL, LDLCALC, TRIG, CHOLHDL, LDLDIRECT in the last 72 hours. Thyroid function studies Recent Labs    05/10/21 1539  TSH 1.856   Anemia work up No results for input(s): VITAMINB12, FOLATE, FERRITIN, TIBC, IRON, RETICCTPCT in the last 72 hours. Urinalysis    Component Value Date/Time   COLORURINE YELLOW (A) 04/12/2021 1947   APPEARANCEUR CLEAR (A) 04/12/2021 1947   LABSPEC 1.009 04/12/2021 1947   PHURINE 6.0 04/12/2021 [place] 04/12/2021 1947   [place] NEGATIVE 04/12/2021 [place] NEGATIVE 04/12/2021 [place] NEGATIVE 04/12/2021 [place] NEGATIVE 04/12/2021 1947   NITRITE NEGATIVE 04/12/2021 1947   LEUKOCYTESUR NEGATIVE 04/12/2021 1947   Sepsis Labs Invalid input(s): PROCALCITONIN,  WBC,  LACTICIDVEN Microbiology Recent Results (from the past 240 hour(s))  Resp Panel by RT-PCR (Flu A&B, Covid) Nasopharyngeal Swab     Status: None   Collection Time: 05/10/21  3:20 PM   Specimen: Nasopharyngeal Swab; Nasopharyngeal(NP) swabs in vial transport medium  Result Value Ref Range Status   SARS Coronavirus 2 by RT PCR NEGATIVE NEGATIVE Final    Comment: (NOTE) SARS-CoV-2 target nucleic acids are NOT DETECTED.  The SARS-CoV-2 RNA is generally detectable in upper respiratory specimens  during the acute phase of infection. The lowest concentration of SARS-CoV-2 viral copies this assay can detect is 138 copies/mL. A negative result does not preclude SARS-Cov-2 infection and should not be used as the sole basis for treatment or other patient management decisions. A negative result may occur with  improper specimen collection/handling, submission of specimen other than nasopharyngeal swab, presence of viral mutation(s) within the areas targeted by this assay, and inadequate number of viral copies(<138 copies/mL). A negative result must be combined with clinical observations, patient history, and epidemiological information. The expected result is Negative.  Fact Sheet for Patients:  EntrepreneurPulse.com.au  Fact Sheet for Healthcare Providers:  IncredibleEmployment.be  This test is no t yet approved or cleared by the [place] FDA and  has been authorized for detection and/or diagnosis of SARS-CoV-2 by FDA under an Emergency Use Authorization (EUA). This EUA will remain  in effect (meaning this test can be used) for the duration of the COVID-19 declaration under Section 564(b)(1) of the Act, 21 U.S.C.section 360bbb-3(b)(1), unless the authorization is terminated  or revoked sooner.       Influenza A by PCR NEGATIVE NEGATIVE Final   Influenza B by PCR NEGATIVE NEGATIVE Final    Comment: (NOTE) The Xpert Xpress SARS-CoV-2/FLU/RSV plus assay is intended as an aid in the diagnosis of influenza from Nasopharyngeal swab  specimens and should not be used as a sole basis for treatment. Nasal washings and aspirates are unacceptable for Xpert Xpress SARS-CoV-2/FLU/RSV testing.  Fact Sheet for Patients: EntrepreneurPulse.com.au  Fact Sheet for Healthcare Providers: IncredibleEmployment.be  This test is not yet approved or cleared by the [place] FDA and has been authorized for detection  and/or diagnosis of SARS-CoV-2 by FDA under an Emergency Use Authorization (EUA). This EUA will remain in effect (meaning this test can be used) for the duration of the COVID-19 declaration under Section 564(b)(1) of the Act, 21 U.S.C. section 360bbb-3(b)(1), unless the authorization is terminated or revoked.  Performed at [facility], [address], [place], [place] [address]      Time coordinating discharge: Over 30 minutes  SIGNED:   Sidney Ace, MD  [facility] 05/11/2021, 3:32 PM Pager   If 7PM-7AM, please contact night-coverage

## 2021-05-11 NOTE — ED Notes (Signed)
Patient ambulatory to bathroom independently.  

## 2021-05-11 NOTE — Progress Notes (Signed)
PROGRESS NOTE    Brian Vasquez  EGB:[id number] DOB: 09-14-1975 DOA: 05/10/2021 PCP: Brian Bellows, MD    Brief Narrative:  45 y.o. male with history of recently diagnosed alcoholic cirrhosis who presents after findings of abnormal sodium levels on routine preprocedural labs.   Patient was recently admitted on October 8 with new diagnosis of liver cirrhosis and ascites due to alcohol.  He was discharged on October 11.  He returned to the hospital on October 19 and underwent a large-volume paracentesis with 10.7 L of ascites removed, is also given albumin during that encounter.  He is currently scheduled to undergo an EGD for variceal screening in 2 days.  As part of preop testing for this procedure he had basic lab work drawn yesterday which showed a new hyponatremia of 115, he had previously been 134 on October 18.  He presented today for repeat labs which again showed a persistent hyponatremia at 116 he was directed to the emergency department for further evaluation and management.   To me patient reports that he feels well.  He denies having any unusual symptoms.  He reports he has been compliant with his spironolactone and Lasix, he does note that these have been increased in dose recently.  He endorses noticing a substantial increase in his urination since starting these medications. He denies any confusion, weakness, muscle aches.  He has also been compliant with a 1500 mg sodium diet that was recommended by his gastroenterologist and was wondering if this could be contributing.  Patient was started on salt tablets and aggressive IV fluid resuscitation.  Serial BMP showed improvement in serum sodium.  Patient relatively asymptomatic.  Feels well.   Assessment & Plan:   Active Problems:   Liver cirrhosis (HCC)   Portal venous hypertension (HCC)   Hyponatremia  Brian Vasquez is a 45 y.o. male with history of recently diagnosed alcoholic cirrhosis who presents after findings of abnormal  sodium levels on routine preprocedural labs.   Hyponatremia Etiology not totally clear.   Possibly secondary to aggressive diuresis and low-sodium diet Patient states he was on 200 of Aldactone and 40 of Lasix as outpatient Volume status appears on the dry side Patient asymptomatic Serial sodiums are improving Plan: Normal saline 100 cc/h Continue every 6 hours BMP Continue salt tabs Likely discharge either later today or tomorrow  #Cirrhosis Scheduled for EGD on Monday, GI consult requested No evidence of hepatic encephalopathy Plan: Diuretics on hold Folic acid and thiamine GI follow-up  DVT prophylaxis: SQ Lovenox Code Status: Full Family Communication: None today Disposition Plan: Status is: Inpatient  Remains inpatient appropriate because: Acute hyponatremia in the setting of cirrhosis.  Serum sodium is improving but not yet in reference range.       Level of care: Med-Surg  Consultants:  GI  Procedures:  None  Antimicrobials: None   Subjective: Seen and examined.  Resting comfortably in bed.  No visible distress.  No pain complaints.  Objective: Vitals:   05/10/21 1431 05/10/21 2330 05/11/21 0114 05/11/21 0818  BP: (!) 144/91 122/70 130/72 122/83  Pulse: 88 71 72 74  Resp: 20 15 20 18   Temp: 97.9 F (36.6 C) 98.6 F (37 C) 97.8 F (36.6 C) 97.8 F (36.6 C)  TempSrc: Oral Oral Oral Oral  SpO2: 99% 98% 100% 100%  Weight: 68 kg     Height: 5\' 10"  (1.778 m)       Intake/Output Summary (Last 24 hours) at 05/11/2021 1200 Last data filed at 05/11/2021  0952 Gross per 24 hour  Intake 2310.29 ml  Output --  Net 2310.29 ml   Filed Weights   05/10/21 1412 05/10/21 1431  Weight: 68 kg 68 kg    Examination:  General exam: No acute distress Respiratory system: Clear to auscultation. Respiratory effort normal. Cardiovascular system: S1-S2, RRR, no murmurs, no pedal edema Gastrointestinal system: Soft, nontender, mild distention, normal bowel  sounds Central nervous system: Alert and oriented. No focal neurological deficits. Extremities: Symmetric 5 x 5 power. Skin: No rashes, lesions or ulcers Psychiatry: Judgement and insight appear normal. Mood & affect appropriate.     Data Reviewed: I have personally reviewed following labs and imaging studies  CBC: Recent Labs  Lab 05/10/21 1539 05/11/21 0448  WBC 13.1* 11.7*  NEUTROABS 9.7*  --   HGB 14.5 12.8*  HCT 38.9* 34.6*  MCV 90.7 89.4  PLT 196 161*   Basic Metabolic Panel: Recent Labs  Lab 05/10/21 1124 05/10/21 1539 05/10/21 1800 05/11/21 0014 05/11/21 0448  NA 116* 116* 116* 117* 120*  K 5.5* 5.2* 5.3* 4.6 4.4  CL 86* 87* 87* 90* 94*  CO2 25 22 20* 22 22  GLUCOSE 122* 109* 99 112* 106*  BUN 13 13 13 11 11   CREATININE 0.66 0.62 0.65 0.55* 0.47*  CALCIUM 8.4* 8.5* 8.5* 8.1* 8.0*  MG 1.8  --   --   --   --    GFR: Estimated Creatinine Clearance: 111 mL/min (A) (by C-G formula based on SCr of 0.47 mg/dL (L)). Liver Function Tests: Recent Labs  Lab 05/09/21 0949 05/10/21 1539  AST 120* 119*  ALT 114* 117*  ALKPHOS 244* 183*  BILITOT 3.5* 3.6*  PROT 7.3 7.5  ALBUMIN 3.8* 3.3*   No results for input(s): LIPASE, AMYLASE in the last 168 hours. No results for input(s): AMMONIA in the last 168 hours. Coagulation Profile: Recent Labs  Lab 05/09/21 0949 05/10/21 1539  INR 1.3* 1.5*   Cardiac Enzymes: No results for input(s): CKTOTAL, CKMB, CKMBINDEX, TROPONINI in the last 168 hours. BNP (last 3 results) No results for input(s): PROBNP in the last 8760 hours. HbA1C: No results for input(s): HGBA1C in the last 72 hours. CBG: No results for input(s): GLUCAP in the last 168 hours. Lipid Profile: No results for input(s): CHOL, HDL, LDLCALC, TRIG, CHOLHDL, LDLDIRECT in the last 72 hours. Thyroid Function Tests: Recent Labs    05/10/21 1539  TSH 1.856   Anemia Panel: No results for input(s): VITAMINB12, FOLATE, FERRITIN, TIBC, IRON, RETICCTPCT in  the last 72 hours. Sepsis Labs: No results for input(s): PROCALCITON, LATICACIDVEN in the last 168 hours.  Recent Results (from the past 240 hour(s))  Resp Panel by RT-PCR (Flu A&B, Covid) Nasopharyngeal Swab     Status: None   Collection Time: 05/10/21  3:20 PM   Specimen: Nasopharyngeal Swab; Nasopharyngeal(NP) swabs in vial transport medium  Result Value Ref Range Status   SARS Coronavirus 2 by RT PCR NEGATIVE NEGATIVE Final    Comment: (NOTE) SARS-CoV-2 target nucleic acids are NOT DETECTED.  The SARS-CoV-2 RNA is generally detectable in upper respiratory specimens during the acute phase of infection. The lowest concentration of SARS-CoV-2 viral copies this assay can detect is 138 copies/mL. A negative result does not preclude SARS-Cov-2 infection and should not be used as the sole basis for treatment or other patient management decisions. A negative result may occur with  improper specimen collection/handling, submission of specimen other than nasopharyngeal swab, presence of viral mutation(s) within the areas  targeted by this assay, and inadequate number of viral copies(<138 copies/mL). A negative result must be combined with clinical observations, patient history, and epidemiological information. The expected result is Negative.  Fact Sheet for Patients:  EntrepreneurPulse.com.au  Fact Sheet for Healthcare Providers:  IncredibleEmployment.be  This test is no t yet approved or cleared by the [place] FDA and  has been authorized for detection and/or diagnosis of SARS-CoV-2 by FDA under an Emergency Use Authorization (EUA). This EUA will remain  in effect (meaning this test can be used) for the duration of the COVID-19 declaration under Section 564(b)(1) of the Act, 21 U.S.C.section 360bbb-3(b)(1), unless the authorization is terminated  or revoked sooner.       Influenza A by PCR NEGATIVE NEGATIVE Final   Influenza B by PCR  NEGATIVE NEGATIVE Final    Comment: (NOTE) The Xpert Xpress SARS-CoV-2/FLU/RSV plus assay is intended as an aid in the diagnosis of influenza from Nasopharyngeal swab specimens and should not be used as a sole basis for treatment. Nasal washings and aspirates are unacceptable for Xpert Xpress SARS-CoV-2/FLU/RSV testing.  Fact Sheet for Patients: EntrepreneurPulse.com.au  Fact Sheet for Healthcare Providers: IncredibleEmployment.be  This test is not yet approved or cleared by the [place] FDA and has been authorized for detection and/or diagnosis of SARS-CoV-2 by FDA under an Emergency Use Authorization (EUA). This EUA will remain in effect (meaning this test can be used) for the duration of the COVID-19 declaration under Section 564(b)(1) of the Act, 21 U.S.C. section 360bbb-3(b)(1), unless the authorization is terminated or revoked.  Performed at [facility], [address], [place],  [address]          Radiology Studies: No results found.      Scheduled Meds:  enoxaparin (LOVENOX) injection  40 mg Subcutaneous QHS   folic acid  1 mg Oral Daily   sodium chloride flush  3 mL Intravenous Q12H   sodium chloride  1 g Oral BID WC   thiamine  100 mg Oral Daily   Continuous Infusions:  sodium chloride 100 mL/hr at 05/11/21 0952     LOS: 1 day    Time spent: 25 minutes    Sidney Ace, MD [facility]   If 7PM-7AM, please contact night-coverage  05/11/2021, 12:00 PM

## 2021-05-11 NOTE — Progress Notes (Signed)
[name], MD [address]  [place]  [place], [place] [address]  Main: [phone]  Fax: [phone] Pager: [phone]   Subjective: No acute events overnight.  Patient continues to feel better.   Objective: Vital signs in last 24 hours: Vitals:   05/10/21 1431 05/10/21 2330 05/11/21 0114 05/11/21 0818  BP: (!) 144/91 122/70 130/72 122/83  Pulse: 88 71 72 74  Resp: 20 15 20 18   Temp: 97.9 F (36.6 C) 98.6 F (37 C) 97.8 F (36.6 C) 97.8 F (36.6 C)  TempSrc: Oral Oral Oral Oral  SpO2: 99% 98% 100% 100%  Weight: 68 kg     Height: 5\' 10"  (1.778 m)      Weight change:   Intake/Output Summary (Last 24 hours) at 05/11/2021 1717 Last data filed at 05/11/2021 1443 Gross per 24 hour  Intake 2430.29 ml  Output --  Net 2430.29 ml     Exam: Heart:: Regular rate and rhythm, S1S2 present, or without murmur or extra heart sounds Lungs: normal and clear to auscultation Abdomen: soft, nontender, normal bowel sounds  Lab Results: CBC Latest Ref Rng & Units 05/11/2021 05/10/2021 04/22/2021  WBC 4.0 - 10.5 K/uL 11.7(H) 13.1(H) 11.0(H)  Hemoglobin 13.0 - 17.0 g/dL 12.8(L) 14.5 14.5  Hematocrit 39.0 - 52.0 % 34.6(L) 38.9(L) 40.2  Platelets 150 - 400 K/uL 142(L) 196 149(L)   CMP Latest Ref Rng & Units 05/11/2021 05/11/2021 05/11/2021  Glucose 70 - 99 mg/dL 108(H) 106(H) 112(H)  BUN 6 - 20 mg/dL 9 11 11   Creatinine 0.61 - 1.24 mg/dL 0.51(L) 0.47(L) 0.55(L)  Sodium 135 - 145 mmol/L 121(L) 120(L) 117(LL)  Potassium 3.5 - 5.1 mmol/L 4.5 4.4 4.6  Chloride 98 - 111 mmol/L 91(L) 94(L) 90(L)  CO2 22 - 32 mmol/L 25 22 22   Calcium 8.9 - 10.3 mg/dL 8.4(L) 8.0(L) 8.1(L)  Total Protein 6.5 - 8.1 g/dL - - -  Total Bilirubin 0.3 - 1.2 mg/dL - - -  Alkaline Phos 38 - 126 U/L - - -  AST 15 - 41 U/L - - -  ALT 0 - 44 U/L - - -     Current Facility-Administered Medications:    0.9 %  sodium chloride infusion, , Intravenous, Continuous, Sreenath, Sudheer B, MD, Last Rate:  100 mL/hr at 05/11/21 0952, New Bag at 05/11/21 0952   acetaminophen (TYLENOL) tablet 650 mg, 650 mg, Oral, Q8H PRN **OR** acetaminophen (TYLENOL) suppository 650 mg, 650 mg, Rectal, Q8H PRN, Clarnce Flock, MD   enoxaparin (LOVENOX) injection 40 mg, 40 mg, Subcutaneous, QHS, Clarnce Flock, MD, 40 mg at 34/19/37 9024   folic acid (FOLVITE) tablet 1 mg, 1 mg, Oral, Daily, Clarnce Flock, MD, 1 mg at 05/11/21 0823   ondansetron (ZOFRAN) tablet 4 mg, 4 mg, Oral, Q6H PRN **OR** ondansetron (ZOFRAN) injection 4 mg, 4 mg, Intravenous, Q6H PRN, Clarnce Flock, MD   oxyCODONE (Oxy IR/ROXICODONE) immediate release tablet 5 mg, 5 mg, Oral, Q4H PRN, Clarnce Flock, MD   polyethylene glycol (MIRALAX / GLYCOLAX) packet 17 g, 17 g, Oral, Daily PRN, Clarnce Flock, MD   sodium chloride flush (NS) 0.9 % injection 3 mL, 3 mL, Intravenous, Q12H, Clarnce Flock, MD   thiamine tablet 100 mg, 100 mg, Oral, Daily, Clarnce Flock, MD, 100 mg at 05/11/21 0973   traZODone (DESYREL) tablet 50 mg, 50 mg, Oral, QHS PRN, Clarnce Flock, MD  Micro Results: Recent Results (from the past 240  hour(s))  Resp Panel by RT-PCR (Flu A&B, Covid) Nasopharyngeal Swab     Status: None   Collection Time: 05/10/21  3:20 PM   Specimen: Nasopharyngeal Swab; Nasopharyngeal(NP) swabs in vial transport medium  Result Value Ref Range Status   SARS Coronavirus 2 by RT PCR NEGATIVE NEGATIVE Final    Comment: (NOTE) SARS-CoV-2 target nucleic acids are NOT DETECTED.  The SARS-CoV-2 RNA is generally detectable in upper respiratory specimens during the acute phase of infection. The lowest concentration of SARS-CoV-2 viral copies this assay can detect is 138 copies/mL. A negative result does not preclude SARS-Cov-2 infection and should not be used as the sole basis for treatment or other patient management decisions. A negative result may occur with  improper specimen collection/handling, submission of  specimen other than nasopharyngeal swab, presence of viral mutation(s) within the areas targeted by this assay, and inadequate number of viral copies(<138 copies/mL). A negative result must be combined with clinical observations, patient history, and epidemiological information. The expected result is Negative.  Fact Sheet for Patients:  EntrepreneurPulse.com.au  Fact Sheet for Healthcare Providers:  IncredibleEmployment.be  This test is no t yet approved or cleared by the [place] FDA and  has been authorized for detection and/or diagnosis of SARS-CoV-2 by FDA under an Emergency Use Authorization (EUA). This EUA will remain  in effect (meaning this test can be used) for the duration of the COVID-19 declaration under Section 564(b)(1) of the Act, 21 U.S.C.section 360bbb-3(b)(1), unless the authorization is terminated  or revoked sooner.       Influenza A by PCR NEGATIVE NEGATIVE Final   Influenza B by PCR NEGATIVE NEGATIVE Final    Comment: (NOTE) The Xpert Xpress SARS-CoV-2/FLU/RSV plus assay is intended as an aid in the diagnosis of influenza from Nasopharyngeal swab specimens and should not be used as a sole basis for treatment. Nasal washings and aspirates are unacceptable for Xpert Xpress SARS-CoV-2/FLU/RSV testing.  Fact Sheet for Patients: EntrepreneurPulse.com.au  Fact Sheet for Healthcare Providers: IncredibleEmployment.be  This test is not yet approved or cleared by the [place] FDA and has been authorized for detection and/or diagnosis of SARS-CoV-2 by FDA under an Emergency Use Authorization (EUA). This EUA will remain in effect (meaning this test can be used) for the duration of the COVID-19 declaration under Section 564(b)(1) of the Act, 21 U.S.C. section 360bbb-3(b)(1), unless the authorization is terminated or revoked.  Performed at [facility], [address] [place], [place] [address]    Studies/Results: No results found. Medications: I have reviewed the patient's current medications. Prior to Admission:  Facility-Administered Medications Prior to Admission  Medication Dose Route Frequency Provider Last Rate Last Admin   albumin human 25 % solution 25 g  25 g Intravenous Once [name], MD       albumin human 25 % solution 25 g  25 g Intravenous Once [name], MD       Medications Prior to Admission  Medication Sig Dispense Refill Last Dose   folic acid (FOLVITE) 1 MG tablet Take 1 tablet (1 mg total) by mouth daily.   05/09/2021   furosemide (LASIX) 20 MG tablet Take 2 tablets (40 mg total) by mouth daily. 60 tablet 1 05/09/2021   Melatonin 10 MG TABS Take 20 mg by mouth at bedtime.   05/09/2021 at 2100   spironolactone (ALDACTONE) 100 MG tablet Take 1 tablet (100 mg total) by mouth 2 (two) times daily. 60 tablet 1 05/09/2021 at 0800  thiamine 100 MG tablet Take 1 tablet (100 mg total) by mouth daily.   05/09/2021   ibuprofen (ADVIL) 200 MG tablet Take 400 mg by mouth every 6 (six) hours as needed for mild pain.      Scheduled:  enoxaparin (LOVENOX) injection  40 mg Subcutaneous QHS   folic acid  1 mg Oral Daily   sodium chloride flush  3 mL Intravenous Q12H   thiamine  100 mg Oral Daily   Continuous:  sodium chloride 100 mL/hr at 05/11/21 3254   DIY:[id number] **OR** acetaminophen, ondansetron **OR** ondansetron (ZOFRAN) IV, oxyCODONE, polyethylene glycol, traZODone Anti-infectives (From admission, onward)    None      Scheduled Meds:  enoxaparin (LOVENOX) injection  40 mg Subcutaneous QHS   folic acid  1 mg Oral Daily   sodium chloride flush  3 mL Intravenous Q12H   thiamine  100 mg Oral Daily   Continuous Infusions:  sodium chloride 100 mL/hr at 05/11/21 0952   PRN Meds:.acetaminophen **OR** acetaminophen, ondansetron **OR** ondansetron (ZOFRAN) IV, oxyCODONE, polyethylene glycol, traZODone   Assessment: Active  Problems:   Liver cirrhosis (HCC)   Portal venous hypertension (HCC)   Hyponatremia  Alcoholic decompensated cirrhosis with ascites requiring 2 large-volume paracentesis is admitted with hypovolemic hyponatremia and hyperkalemia.  Patient also has mildly elevated TTG, alpha-1 antitrypsin deficiency, elevated ferritin levels, secondary liver disease work-up in progress  Plan: Hyponatremia: Serum sodium improved from 115 to 121 with normal saline Patient should not receive salt tablets as there is no indication Continue 2 g sodium diet Continue to hold diuretics  Hyperkalemia: Has resolved Serum potassium 4.5 today  Patient can be discharged home today.  He is scheduled for upper endoscopy tomorrow with Dr. Vicente Males.  We will recheck his BMP prior to upper endoscopy tomorrow.  I checked with anesthesia today and they are comfortable doing the procedure if serum sodium is 125 or greater  Follow-up with Dr. Vicente Males as outpatient   LOS: 1 day   Lindalee Huizinga 05/11/2021, 5:17 PM

## 2021-05-11 NOTE — Plan of Care (Signed)
  Problem: Clinical Measurements: Goal: Ability to maintain clinical measurements within normal limits will improve Outcome: Progressing Goal: Diagnostic test results will improve Outcome: Progressing   Problem: Clinical Measurements: Goal: Diagnostic test results will improve Outcome: Progressing

## 2021-05-12 ENCOUNTER — Ambulatory Visit: Payer: 59 | Admitting: Certified Registered"

## 2021-05-12 ENCOUNTER — Ambulatory Visit
Admission: RE | Admit: 2021-05-12 | Discharge: 2021-05-12 | Disposition: A | Discharge status: Home / Self Care | Payer: 59 | Attending: Gastroenterology | Admitting: Gastroenterology

## 2021-05-12 ENCOUNTER — Encounter
Admission: RE | Disposition: A | Discharge status: Home / Self Care | Payer: Self-pay | Source: Home / Self Care | Attending: Gastroenterology

## 2021-05-12 ENCOUNTER — Encounter: Payer: Self-pay | Admitting: Gastroenterology

## 2021-05-12 DIAGNOSIS — K3189 Other diseases of stomach and duodenum: Secondary | ICD-10-CM | POA: Diagnosis not present

## 2021-05-12 DIAGNOSIS — R894 Abnormal immunological findings in specimens from other organs, systems and tissues: Secondary | ICD-10-CM | POA: Diagnosis not present

## 2021-05-12 DIAGNOSIS — K766 Portal hypertension: Secondary | ICD-10-CM | POA: Diagnosis not present

## 2021-05-12 DIAGNOSIS — K7460 Unspecified cirrhosis of liver: Secondary | ICD-10-CM | POA: Insufficient documentation

## 2021-05-12 DIAGNOSIS — K7031 Alcoholic cirrhosis of liver with ascites: Secondary | ICD-10-CM

## 2021-05-12 DIAGNOSIS — I8510 Secondary esophageal varices without bleeding: Secondary | ICD-10-CM | POA: Insufficient documentation

## 2021-05-12 HISTORY — PX: ESOPHAGOGASTRODUODENOSCOPY (EGD) WITH PROPOFOL: SHX5813

## 2021-05-12 HISTORY — DX: Unspecified cirrhosis of liver: K74.60

## 2021-05-12 LAB — COMPREHENSIVE METABOLIC PANEL
ALT: 114 U/L — ABNORMAL HIGH (ref 0–44)
AST: 111 U/L — ABNORMAL HIGH (ref 15–41)
Albumin: 2.8 g/dL — ABNORMAL LOW (ref 3.5–5.0)
Alkaline Phosphatase: 177 U/L — ABNORMAL HIGH (ref 38–126)
Anion gap: 6 (ref 5–15)
BUN: 14 mg/dL (ref 6–20)
CO2: 23 mmol/L (ref 22–32)
Calcium: 8.8 mg/dL — ABNORMAL LOW (ref 8.9–10.3)
Chloride: 93 mmol/L — ABNORMAL LOW (ref 98–111)
Creatinine, Ser: 0.68 mg/dL (ref 0.61–1.24)
GFR, Estimated: >60 mL/min (ref >60)
Glucose, Bld: 109 mg/dL — ABNORMAL HIGH (ref 70–99)
Potassium: 4.6 mmol/L (ref 3.5–5.1)
Sodium: 122 mmol/L — ABNORMAL LOW (ref 135–145)
Total Bilirubin: 3.7 mg/dL — ABNORMAL HIGH (ref 0.3–1.2)
Total Protein: 7.1 g/dL (ref 6.5–8.1)

## 2021-05-12 LAB — IRON AND TIBC
Iron: 199 ug/dL — ABNORMAL HIGH (ref 45–182)
Saturation Ratios: 88 % — ABNORMAL HIGH (ref 17.9–39.5)
TIBC: 227 ug/dL — ABNORMAL LOW (ref 250–450)
UIBC: 28 ug/dL

## 2021-05-12 LAB — FERRITIN: Ferritin: 473 ng/mL — ABNORMAL HIGH (ref 24–336)

## 2021-05-12 SURGERY — ESOPHAGOGASTRODUODENOSCOPY (EGD) WITH PROPOFOL
Anesthesia: General

## 2021-05-12 MED ORDER — GLYCOPYRROLATE 0.2 MG/ML IJ SOLN
INTRAMUSCULAR | Status: DC | PRN
  Administered 2021-05-12: .2 mg via INTRAVENOUS

## 2021-05-12 MED ORDER — MIDAZOLAM HCL 5 MG/5ML IJ SOLN
INTRAMUSCULAR | Status: DC | PRN
  Administered 2021-05-12: 2 mg via INTRAVENOUS

## 2021-05-12 MED ORDER — PROPOFOL 10 MG/ML IV BOLUS
INTRAVENOUS | Status: DC | PRN
  Administered 2021-05-12: 30 mg via INTRAVENOUS
  Administered 2021-05-12: 70 mg via INTRAVENOUS

## 2021-05-12 MED ORDER — PROPOFOL 500 MG/50ML IV EMUL
INTRAVENOUS | Status: DC | PRN
  Administered 2021-05-12: 120 ug/kg/min via INTRAVENOUS

## 2021-05-12 MED ORDER — SODIUM CHLORIDE 0.9 % IV SOLN: INTRAVENOUS | Status: DC

## 2021-05-12 MED ORDER — MIDAZOLAM HCL 2 MG/2ML IJ SOLN
INTRAMUSCULAR | Status: AC
  Filled 2021-05-12: qty 2

## 2021-05-12 MED ORDER — LIDOCAINE 2% (20 MG/ML) 5 ML SYRINGE
INTRAMUSCULAR | Status: DC | PRN
  Administered 2021-05-12: 25 mg via INTRAVENOUS

## 2021-05-12 NOTE — H&P (Signed)
[name], MD [address], [place], [place], [place], [address] [id number], [place], [place], [place], [address] Phone: [phone]  Fax: [phone]  Primary Care Physician:  [name], MD   Pre-Procedure History & Physical: HPI:  Brian Vasquez is a 45 y.o. male is here for an endoscopy    Past Medical History:  Diagnosis Date   Cirrhosis ([place])    Hypertension     History reviewed. No pertinent surgical history.  Prior to Admission medications   Medication Sig Start Date End Date Taking? Authorizing Provider  furosemide (LASIX) 40 MG tablet Take 40 mg by mouth.   Yes [provider]  spironolactone (ALDACTONE) 100 MG tablet Take 200 mg by mouth daily.   Yes [provider]  folic acid (FOLVITE) 1 MG tablet Take 1 tablet (1 mg total) by mouth daily. 04/16/21   Cherene Altes, MD  ibuprofen (ADVIL) 200 MG tablet Take 400 mg by mouth every 6 (six) hours as needed for mild pain.    [provider]  Melatonin 10 MG TABS Take 20 mg by mouth at bedtime.    [provider]  thiamine 100 MG tablet Take 1 tablet (100 mg total) by mouth daily. 04/16/21   Cherene Altes, MD    Allergies as of 04/22/2021   (No Known Allergies)    History reviewed. No pertinent family history.  Social History   Socioeconomic History   Marital status: Divorced    Spouse name: Not on file   Number of children: Not on file   Years of education: Not on file   Highest education level: Not on file  Occupational History   Not on file  Tobacco Use   Smoking status: Former    Types: Cigarettes   Smokeless tobacco: Never  Vaping Use   Vaping Use: Never used  Substance and Sexual Activity   Alcohol use: Yes    Comment: Daily- 2 liquor drinks/day   Drug use: Not Currently   Sexual activity: Not on file  Other Topics Concern   Not on file  Social History Narrative   Not on file   Social Determinants of Health   Financial Resource  Strain: Not on file  Food Insecurity: Not on file  Transportation Needs: Not on file  Physical Activity: Not on file  Stress: Not on file  Social Connections: Not on file  Intimate Partner Violence: Not on file    Review of Systems: See HPI, otherwise negative ROS  Physical Exam: BP (!) 134/97   Pulse 89   Temp (!) 97.2 F (36.2 C) (Temporal)   Resp 16   Ht 5\' 10"  (1.778 m)   Wt 70.3 kg   SpO2 100%   BMI 22.24 kg/m  General:   Alert,  pleasant and cooperative in NAD Head:  Normocephalic and atraumatic. Neck:  Supple; no masses or thyromegaly. Lungs:  Clear throughout to auscultation, normal respiratory effort.    Heart:  +S1, +S2, Regular rate and rhythm, No edema. Abdomen:  Soft, nontender and nondistended. Normal bowel sounds, without guarding, and without rebound.   Neurologic:  Alert and  oriented x4;  grossly normal neurologically.  Impression/Plan: Brian Vasquez is here for an endoscopy  to be performed for  evaluation of esophageal varices    Risks, benefits, limitations, and alternatives regarding endoscopy have been reviewed with the patient.  Questions have been answered.  All parties agreeable.   [name], MD  05/12/2021,  7:46 AM

## 2021-05-12 NOTE — Progress Notes (Signed)
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

## 2021-05-12 NOTE — Op Note (Signed)
[facility] Patient Name: Brian Vasquez Procedure Date: 05/12/2021 7:43 AM MRN: [id number] Account #: [id number] Date of Birth: 06/20/1976 Admit Type: Outpatient Age: 45 Room: [facility] ENDO ROOM 4 Gender: Male Note Status: Finalized Instrument Name: Upper Endoscope [id number] Procedure:             Upper GI endoscopy Indications:           Cirrhosis rule out esophageal varices Providers:             [name] MD, MD Medicines:             Monitored Anesthesia Care Complications:         No immediate complications. Procedure:             Pre-Anesthesia Assessment:                        - Prior to the procedure, a History and Physical was                         performed, and patient medications, allergies and                         sensitivities were reviewed. The patient's tolerance                         of previous anesthesia was reviewed.                        - The risks and benefits of the procedure and the                         sedation options and risks were discussed with the                         patient. All questions were answered and informed                         consent was obtained.                        - ASA Grade Assessment: III - A patient with severe                         systemic disease.                        After obtaining informed consent, the endoscope was                         passed under direct vision. Throughout the procedure,                         the patient's blood pressure, pulse, and oxygen                         saturations were monitored continuously. The Endoscope                         was introduced through the mouth, and advanced to the  third part of duodenum. The upper GI endoscopy was                         accomplished with ease. The patient tolerated the                         procedure well. Findings:      The examined duodenum was normal. Biopsies for  histology were taken with       a cold forceps for evaluation of celiac disease.      Mild portal hypertensive gastropathy was found in the entire examined       stomach.      The cardia and gastric fundus were normal on retroflexion.      Grade III varices were found in the lower third of the esophagus. They       were large in size. Three bands were successfully placed with incomplete       eradication of varices. There was no bleeding during and at the end of       the procedure. Impression:            - Normal examined duodenum. Biopsied.                        - Portal hypertensive gastropathy.                        - Grade III esophageal varices. Incompletely                         eradicated. Banded. Recommendation:        - Discharge patient to home (with escort).                        - Resume previous diet.                        - Continue present medications.                        - Repeat upper endoscopy in 4 weeks for retreatment.                        - Return to GI office as previously scheduled. Procedure Code(s):     --- Professional ---                        [phone], Esophagogastroduodenoscopy, flexible,                         transoral; with band ligation of esophageal/gastric                         varices                        [id number], Esophagogastroduodenoscopy, flexible,                         transoral; with biopsy, single or multiple Diagnosis Code(s):     --- Professional ---  K74.60, Unspecified cirrhosis of liver                        I85.10, Secondary esophageal varices without bleeding                        K76.6, Portal hypertension                        K31.89, Other diseases of stomach and duodenum CPT copyright 2019 American Medical Association. All rights reserved. The codes documented in this report are preliminary and upon coder review may  be revised to meet current compliance requirements. [name], MD [name] MD, MD 05/12/2021 9:10:18 AM This report has been signed electronically. Number of Addenda: 0 Note Initiated On: 05/12/2021 7:43 AM Estimated Blood Loss:  Estimated blood loss: none.      [facility]

## 2021-05-12 NOTE — Anesthesia Preprocedure Evaluation (Signed)
Anesthesia Evaluation  Patient identified by MRN, date of birth, ID band Patient awake    Reviewed: Allergy & Precautions, H&P , NPO status , Patient's Chart, lab work & pertinent test results, reviewed documented beta blocker date and time   Airway Mallampati: II   Neck ROM: full    Dental  (+) Teeth Intact   Pulmonary neg pulmonary ROS, former smoker,    Pulmonary exam normal        Cardiovascular Exercise Tolerance: Good hypertension, On Medications negative cardio ROS Normal cardiovascular exam Rhythm:regular Rate:Normal     Neuro/Psych negative neurological ROS  negative psych ROS   GI/Hepatic negative GI ROS, Neg liver ROS,   Endo/Other  negative endocrine ROS  Renal/GU negative Renal ROS  negative genitourinary   Musculoskeletal   Abdominal   Peds  Hematology negative hematology ROS (+)   Anesthesia Other Findings Past Medical History: No date: Cirrhosis ([place]) No date: Hypertension History reviewed. No pertinent surgical history.   Reproductive/Obstetrics negative OB ROS                             Anesthesia Physical Anesthesia Plan  ASA: 3  Anesthesia Plan: General   Post-op Pain Management:    Induction:   PONV Risk Score and Plan:   Airway Management Planned:   Additional Equipment:   Intra-op Plan:   Post-operative Plan:   Informed Consent: I have reviewed the patients History and Physical, chart, labs and discussed the procedure including the risks, benefits and alternatives for the proposed anesthesia with the patient or authorized representative who has indicated his/her understanding and acceptance.     Dental Advisory Given  Plan Discussed with: CRNA  Anesthesia Plan Comments:         Anesthesia Quick Evaluation

## 2021-05-12 NOTE — Transfer of Care (Signed)
Immediate Anesthesia Transfer of Care Note  Patient: Brian Vasquez  Procedure(s) Performed: ESOPHAGOGASTRODUODENOSCOPY (EGD) WITH PROPOFOL  Patient Location: Endoscopy Unit  Anesthesia Type:General  Level of Consciousness: awake  Airway & Oxygen Therapy: Patient Spontanous Breathing  Post-op Assessment: Report given to RN and Post -op Vital signs reviewed and stable  Post vital signs: Reviewed  Last Vitals:  Vitals Value Taken Time  BP 132/88 05/12/21 0912  Temp 36.1 C 05/12/21 0912  Pulse 121 05/12/21 0913  Resp 22 05/12/21 0913  SpO2 97 % 05/12/21 0913  Vitals shown include unvalidated device data.  Last Pain:  Vitals:   05/12/21 0912  TempSrc: Temporal  PainSc: Asleep         Complications: No notable events documented.

## 2021-05-13 ENCOUNTER — Encounter: Payer: Self-pay | Admitting: Gastroenterology

## 2021-05-13 LAB — ALPHA-1-ANTITRYPSIN: A-1 Antitrypsin, Ser: <20 mg/dL — ABNORMAL LOW (ref 101–187)

## 2021-05-13 LAB — SURGICAL PATHOLOGY

## 2021-05-13 NOTE — Anesthesia Postprocedure Evaluation (Signed)
Anesthesia Post Note  Patient: Brian Vasquez  Procedure(s) Performed: ESOPHAGOGASTRODUODENOSCOPY (EGD) WITH PROPOFOL  Patient location during evaluation: PACU Anesthesia Type: General Level of consciousness: awake and alert Pain management: pain level controlled Vital Signs Assessment: post-procedure vital signs reviewed and stable Respiratory status: spontaneous breathing, nonlabored ventilation, respiratory function stable and patient connected to nasal cannula oxygen Cardiovascular status: blood pressure returned to baseline and stable Postop Assessment: no apparent nausea or vomiting Anesthetic complications: no   No notable events documented.   Last Vitals:  Vitals:   05/12/21 0912 05/12/21 0932  BP: 132/88 (!) 122/99  Pulse: (!) 121   Resp: (!) 21   Temp: (!) 36.1 C   SpO2: 98%     Last Pain:  Vitals:   05/13/21 0751  TempSrc:   PainSc: 0-No pain                 Molli Barrows

## 2021-05-14 LAB — BASIC METABOLIC PANEL
BUN/Creatinine Ratio: 12 (ref 9–20)
BUN: 8 mg/dL (ref 6–24)
CO2: 22 mmol/L (ref 20–29)
Calcium: 8.9 mg/dL (ref 8.7–10.2)
Chloride: 85 mmol/L — ABNORMAL LOW (ref 96–106)
Creatinine, Ser: 0.67 mg/dL — ABNORMAL LOW (ref 0.76–1.27)
Glucose: 101 mg/dL — ABNORMAL HIGH (ref 70–99)
Potassium: 5.1 mmol/L (ref 3.5–5.2)
Sodium: 118 mmol/L — CL (ref 134–144)
eGFR: 117 mL/min/{1.73_m2} (ref >59)

## 2021-05-14 LAB — ALPHA-1-ANTITRYPSIN PHENOTYP: A-1 Antitrypsin: <20 mg/dL — ABNORMAL LOW (ref 101–187)

## 2021-05-19 ENCOUNTER — Encounter: Payer: Self-pay | Admitting: Gastroenterology

## 2021-05-19 ENCOUNTER — Other Ambulatory Visit: Payer: Self-pay

## 2021-05-19 ENCOUNTER — Ambulatory Visit: Payer: 59 | Admitting: Gastroenterology

## 2021-05-19 VITALS — BP 148/87 | HR 85 | Temp 98.1°F | Wt 167.4 lb

## 2021-05-19 DIAGNOSIS — K7031 Alcoholic cirrhosis of liver with ascites: Secondary | ICD-10-CM

## 2021-05-19 DIAGNOSIS — R7989 Other specified abnormal findings of blood chemistry: Secondary | ICD-10-CM

## 2021-05-19 DIAGNOSIS — I8510 Secondary esophageal varices without bleeding: Secondary | ICD-10-CM

## 2021-05-19 DIAGNOSIS — K7682 Hepatic encephalopathy: Secondary | ICD-10-CM

## 2021-05-19 DIAGNOSIS — E8801 Alpha-1-antitrypsin deficiency: Secondary | ICD-10-CM

## 2021-05-19 DIAGNOSIS — R772 Abnormality of alphafetoprotein: Secondary | ICD-10-CM

## 2021-05-19 MED ORDER — RIFAXIMIN 550 MG PO TABS: 550.0000 mg | ORAL_TABLET | Freq: Two times a day (BID) | ORAL | 3 refills | Status: DC

## 2021-05-19 MED ORDER — LACTULOSE 10 GM/15ML PO SOLN: 10.0000 g | Freq: Two times a day (BID) | ORAL | 6 refills | Status: DC

## 2021-05-19 NOTE — Patient Instructions (Signed)
We will be referring you to [facility] Liver clinic. They will call you with a date and time for your appointment. [place] [phone]

## 2021-05-19 NOTE — Progress Notes (Signed)
Brian Bellows MD, MRCP(U.K) [address]  [place]  [place], [place] [address]  Main: [phone]  Fax: [phone]   Primary Care Physician: Brian Bellows, MD  Primary Gastroenterologist:  Dr. Jonathon Vasquez   C/c : Chronic decompensated liver disease follow-up   HPI: Brian Vasquez is a 45 y.o. male  Summary of history :   I was consulted to see him on 04/13/2021 when he presented to the emergency room with abdominal distention and was found to have significant ascites.  In the ER he underwent an ultrasound which showed cirrhosis with a 4.9 cm masslike lesion in the caudate lobe.  He underwent an MRI of the liver without contrast and with contrast to look at the lesion and showed no concern for malignancy simply noted focal nodular area of the liver.  Plan was to correlate with AFP which was elevated at 79.5.  Acetic fluid showed no evidence of SBP and likely was due to portal hypertension.  INR was 1.7.  Hemoglobin 13.4.  Hepatitis B core antibody was positive hepatitis C antibody was reactive hepatitis B surface antibody was negative and hepatitis C antibody was negative surface antigen was negative   Interval history 04/22/2021-05/19/2021  Was admitted to the hospital for hyponatremia and discharged on 05/12/2021. Likely secondary to combination of over diuresis and salt restriction. He didn't get his labs a few days after his last increase in diurertics.   05/12/2021: EGD:  3 bands placed over non bleeding large esophageal varices. Duodenal bx shows no evidence of celiac disease.   Labs so far   Low alpha 1 antitrypsin levels, Elevated Ferritin and iron % saturation 88. INR 1.3 Ascitic fluid  shows SAAG suggestive of portal hypertension. AFP elevated 63.  MRI showed no clear mass but since AFP still elevated will plan further evaluation   05/10/2021 weight 68 kg's He has gained weight around 11 pounds since last visit.  Abdomen has been swelling up.  He restarted his Aldactone  and Lasix at 100 mg and 40 mg respectively.  Denies any NSAID use.  He is on a low-salt diet.  Having difficulty sleeping at night.  Having 1 bowel movement per day.  Not very soft.  Not on any lactulose.  Not drinking alcohol.  Current Outpatient Medications  Medication Sig Dispense Refill  . folic acid (FOLVITE) 1 MG tablet Take 1 tablet (1 mg total) by mouth daily.    . furosemide (LASIX) 40 MG tablet Take 40 mg by mouth.    Marland Kitchen ibuprofen (ADVIL) 200 MG tablet Take 400 mg by mouth every 6 (six) hours as needed for mild pain.    . Melatonin 10 MG TABS Take 20 mg by mouth at bedtime.    Marland Kitchen spironolactone (ALDACTONE) 100 MG tablet Take 200 mg by mouth daily.    Marland Kitchen thiamine 100 MG tablet Take 1 tablet (100 mg total) by mouth daily.     No current facility-administered medications for this visit.    Allergies as of 05/19/2021  . (No Known Allergies)   ROS:  General: Negative for anorexia, weight loss, fever, chills, fatigue, weakness. ENT: Negative for hoarseness, difficulty swallowing , nasal congestion. CV: Negative for chest pain, angina, palpitations, dyspnea on exertion, peripheral edema.  Respiratory: Negative for dyspnea at rest, dyspnea on exertion, cough, sputum, wheezing.  GI: See history of present illness. GU:  Negative for dysuria, hematuria, urinary incontinence, urinary frequency, nocturnal urination.  Endo: Negative for unusual weight change.  Physical Examination:   BP (!) 148/87   Pulse 85   Temp 98.1 F (36.7 C) (Oral)   Wt 167 lb 6.4 oz (75.9 kg)   BMI 24.02 kg/m   General: Well-nourished, well-developed in no acute distress.  Eyes: No icterus. Conjunctivae pink. Mouth: Oropharyngeal mucosa moist and pink , no lesions erythema or exudate. Abdomen: Distended.  No guarding rigidity or tenderness.  Free fluid present. Extremities: No lower extremity edema. No clubbing or deformities. Neuro: Alert and oriented x 3.  Grossly intact. Skin: Warm and dry, no  jaundice.   Psych: Alert and cooperative, normal mood and affect.   Imaging Studies: US Paracentesis  Result Date: 04/23/2021 INDICATION: History of alcoholic cirrhosis, now with recurrent symptomatic ascites. Please perform ultrasound-guided paracentesis for diagnostic and therapeutic purposes. EXAM: ULTRASOUND-GUIDED PARACENTESIS COMPARISON:  Ultrasound-guided paracentesis-04/14/2021 (yielding 6 L of peritoneal fluid) Abdominal MRI-04/13/2021; right upper quadrant abdominal ultrasound-04/12/2021 MEDICATIONS: None. COMPLICATIONS: None immediate. TECHNIQUE: Informed written consent was obtained from the patient after a discussion of the risks, benefits and alternatives to treatment. A timeout was performed prior to the initiation of the procedure. Initial ultrasound scanning demonstrates a large amount of ascites within the right lower abdomen which was subsequently prepped and draped in the usual sterile fashion. 1% lidocaine with epinephrine was used for local anesthesia. An ultrasound image was saved for documentation purposed. An 8 Fr Safe-T-Centesis catheter was introduced. The paracentesis was performed. The catheter was removed and a dressing was applied. The patient tolerated the procedure well without immediate post procedural complication. FINDINGS: A total of approximately 10.7 liters of serous fluid was removed. Samples were sent to the laboratory as requested by the clinical team. IMPRESSION: Successful ultrasound-guided paracentesis yielding 10.7 liters of peritoneal fluid. Electronically Signed   By: Sandi Mariscal M.D.   On: 04/23/2021 12:11    Assessment and Plan:   Brian Vasquez is a 45 y.o. y/o male with recent diagnosis in October 2119 with alcoholic liver cirrhosis, decompensation with ascites presented to the ER with abdominal distention and acute liver failure . Concern for masslike lesion on ultrasound but no mass seen on MRI but AFP has been elevated.  Repeat MRI requested with  and without gadolinium.  Immune to hepatitis A but not hepatitis B, has commenced vaccination.  Did not tolerate 200 mg of Aldactone and 40 mg of Lasix as he developed severe hyponatremia.  It was stopped during hospitalization and he restarted it 2 days back as he was gaining weight but at a lower dose.  esophageal varices status post banding with no history of bleeding.  Positive tissue transglutaminase antibody but negative biopsies of the duodenum for celiac disease.  Tests for autoimmune liver disease was negative.  Elevated ferritin, very low alpha-1 antitrypsin levels, persistently elevated AFP   Plan 1.  No NSAIDs 2.  Check daily weights and weight is going up to give our office a call 3.  Check CMP to ensure hyponatremia is not getting any worse.  He will continue Aldactone and Lasix at 100 mg and 40 mg respectively 4.  Low-sodium diet less than 2000 mg/day 5.  Elevated AFP: .  Repeat MRI with and without IV gadolinium with better IV access 6.  EGD for surveillance of esophageal varices towards the end of December 7.  Continue to stay off all alcohol 8.  Complete hepatitis B vaccination 9.  Refer to [facility] hepatology to evaluate for alpha-1 antitrypsin deficiency, elevated AFP, elevated and iron percentage saturation.  Very likely  will need liver biopsy which I would suggest to be done at [facility] as they have a specialized liver pathologist.  I have ordered HFE gene mutation test, 10.  Hepatic encephalopathy commence on lactulose titrated to soft bowel movement per day commence on Xifaxan  I have discussed alternative options, risks & benefits,  which include, but are not limited to, bleeding, infection, perforation,respiratory complication & drug reaction.  The patient agrees with this plan & written consent will be obtained.    Dr Brian Bellows  MD,MRCP [facility]) Follow up in 2 to 3 weeks

## 2021-05-21 ENCOUNTER — Telehealth: Payer: Self-pay

## 2021-05-21 NOTE — Telephone Encounter (Signed)
Referral to [facility] was faxed and they will contact the patient to schedule date and time.

## 2021-05-22 LAB — COMPREHENSIVE METABOLIC PANEL
ALT: 122 IU/L — ABNORMAL HIGH (ref 0–44)
AST: 141 IU/L — ABNORMAL HIGH (ref 0–40)
Albumin/Globulin Ratio: 1.1 — ABNORMAL LOW (ref 1.2–2.2)
Albumin: 3.7 g/dL — ABNORMAL LOW (ref 4.0–5.0)
Alkaline Phosphatase: 293 IU/L — ABNORMAL HIGH (ref 44–121)
BUN/Creatinine Ratio: 16 (ref 9–20)
BUN: 13 mg/dL (ref 6–24)
Bilirubin Total: 3.2 mg/dL — ABNORMAL HIGH (ref 0.0–1.2)
CO2: 22 mmol/L (ref 20–29)
Calcium: 9.3 mg/dL (ref 8.7–10.2)
Chloride: 95 mmol/L — ABNORMAL LOW (ref 96–106)
Creatinine, Ser: 0.81 mg/dL (ref 0.76–1.27)
Globulin, Total: 3.4 g/dL (ref 1.5–4.5)
Glucose: 112 mg/dL — ABNORMAL HIGH (ref 70–99)
Potassium: 5 mmol/L (ref 3.5–5.2)
Sodium: 130 mmol/L — ABNORMAL LOW (ref 134–144)
Total Protein: 7.1 g/dL (ref 6.0–8.5)
eGFR: 110 mL/min/{1.73_m2} (ref >59)

## 2021-05-22 LAB — CBC WITH DIFFERENTIAL/PLATELET
Basophils Absolute: 0.1 10*3/uL (ref 0.0–0.2)
Basos: 1 %
EOS (ABSOLUTE): 0.2 10*3/uL (ref 0.0–0.4)
Eos: 2 %
Hematocrit: 39.9 % (ref 37.5–51.0)
Hemoglobin: 14.3 g/dL (ref 13.0–17.7)
Immature Grans (Abs): 0 10*3/uL (ref 0.0–0.1)
Immature Granulocytes: 0 %
Lymphocytes Absolute: 2 10*3/uL (ref 0.7–3.1)
Lymphs: 20 %
MCH: 32.7 pg (ref 26.6–33.0)
MCHC: 35.8 g/dL — ABNORMAL HIGH (ref 31.5–35.7)
MCV: 91 fL (ref 79–97)
Monocytes Absolute: 0.9 10*3/uL (ref 0.1–0.9)
Monocytes: 9 %
Neutrophils Absolute: 6.9 10*3/uL (ref 1.4–7.0)
Neutrophils: 68 %
Platelets: 137 10*3/uL — ABNORMAL LOW (ref 150–450)
RBC: 4.37 x10E6/uL (ref 4.14–5.80)
RDW: 14.1 % (ref 11.6–15.4)
WBC: 10.1 10*3/uL (ref 3.4–10.8)

## 2021-05-22 LAB — PROTIME-INR
INR: 1.3 — ABNORMAL HIGH (ref 0.9–1.2)
Prothrombin Time: 13.2 s — ABNORMAL HIGH (ref 9.1–12.0)

## 2021-05-27 ENCOUNTER — Ambulatory Visit: Payer: 59 | Admitting: Gastroenterology

## 2021-05-28 ENCOUNTER — Ambulatory Visit
Admission: RE | Admit: 2021-05-28 | Discharge: 2021-05-28 | Disposition: A | Discharge status: Home / Self Care | Payer: 59 | Source: Ambulatory Visit | Attending: Gastroenterology | Admitting: Gastroenterology

## 2021-05-28 DIAGNOSIS — K7031 Alcoholic cirrhosis of liver with ascites: Secondary | ICD-10-CM | POA: Insufficient documentation

## 2021-05-28 IMAGING — MR MR ABDOMEN WO/W CM
17 series · 48 of 48 positions shown · IV contrast (7.5ml Gadavist)
Comparison: [DATE].

CLINICAL DATA: 45-year-old male with history of cirrhosis and
potential hepatic mass which was noted on prior ultrasound. Also
previous MRI was performed which was limited.

EXAM:
MRI ABDOMEN WITHOUT AND WITH CONTRAST
TECHNIQUE: Multiplanar multisequence MR imaging of the abdomen was performed
both before and after the administration of intravenous contrast.
CONTRAST:  7.5mL GADAVIST GADOBUTROL 1 MMOL/ML IV SOLN

[Series 3: T2 · coronal · 6.5mm · 1.19mm/px · 2 of 30 slices shown (1 of 2)]
[im 1/30]
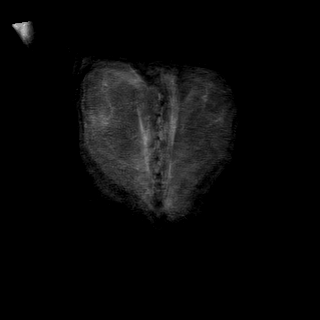
[im 30/30]
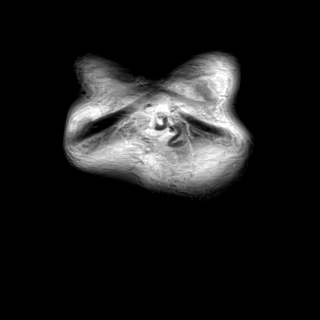

[Series 4: T2 · axial · 6.5mm · 1.19mm/px · z∈[-145,+96]mm · 2 of 32 slices shown (2 of 2)]
[im 1/32]
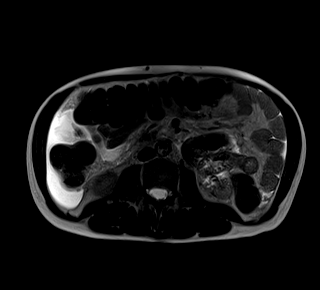
[im 32/32]
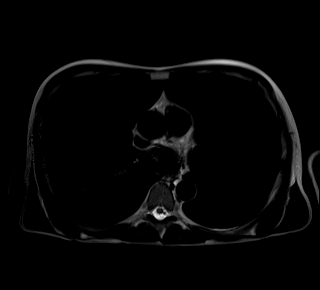

[Series 5: T1 · axial · 3.5mm · 1.19mm/px · z∈[-149,+100]mm · 4 of 72 slices shown (1 of 2)]
[im 1/72]
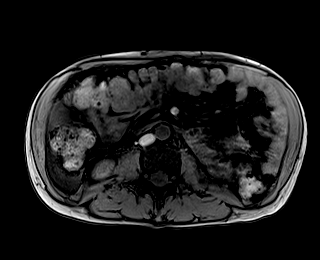
[im 24/72]
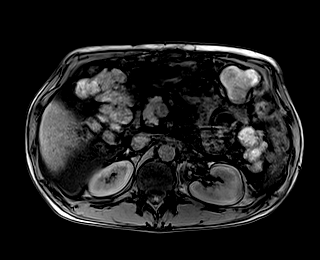
[im 48/72]
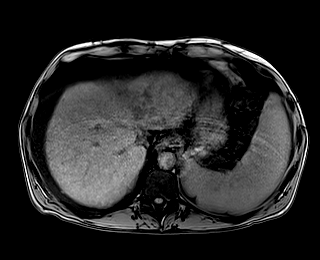
[im 72/72]
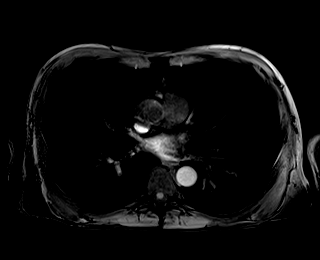

[Series 6: T1 · axial · 3.5mm · 1.19mm/px · z∈[-149,+100]mm · 4 of 72 slices shown (2 of 2)]
[im 1/72]
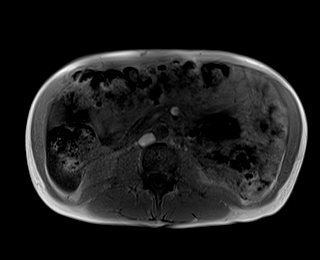
[im 24/72]
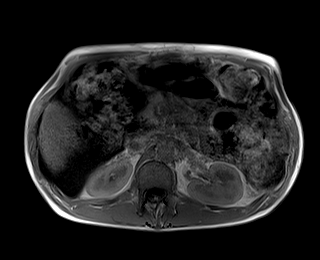
[im 48/72]
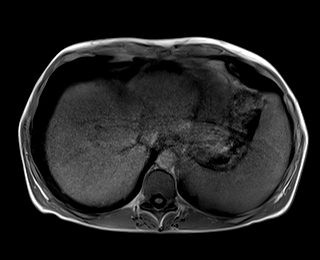
[im 72/72]
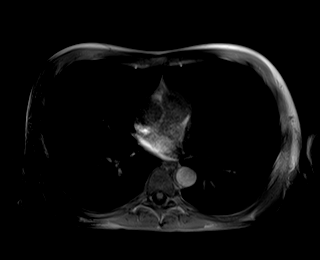

[Series 9: T2 fat-sat · axial · 6.5mm · 1.19mm/px · 1 of 30 slices shown]
[im 1/30]
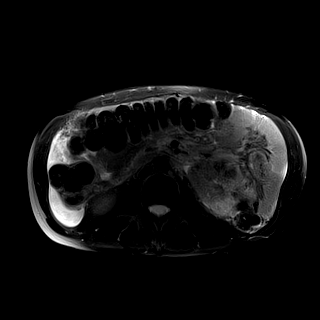

[Series 10: ax dwi_tracew · axial · 6.5mm · 1.42mm/px · z∈[-138,+88]mm · 4 of 90 slices shown]
[im 1/90]
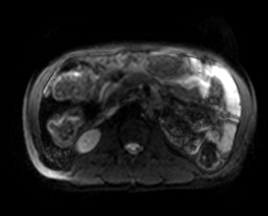
[im 30/90]
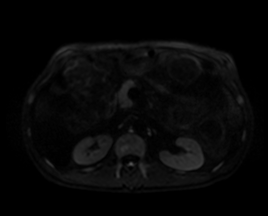
[im 60/90]
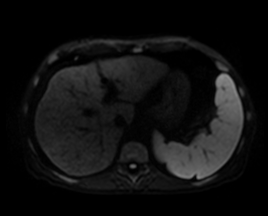
[im 90/90]
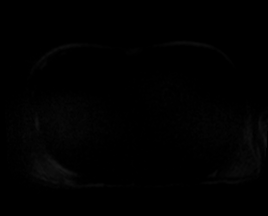

[Series 11: ax dwi_adc · axial · 6.5mm · 1.42mm/px · 1 of 30 slices shown]
[im 1/30]
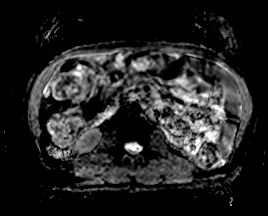

[Series 12: T1 dynamic fat-sat · axial · non-contrast · 3.2mm · 1.19mm/px · z∈[-145,+82]mm · 3 of 72 slices shown (1 of 5)]
[im 1/72]
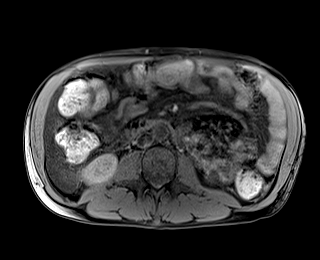
[im 36/72]
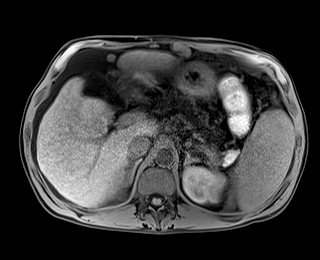
[im 72/72]
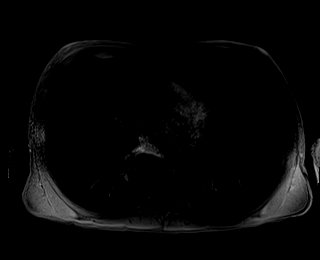

[Series 13: T1 dynamic fat-sat post-contrast · axial · 3.2mm · 1.19mm/px · z∈[-145,+82]mm · 3 of 72 slices shown (1 of 4)]
[im 1/72]
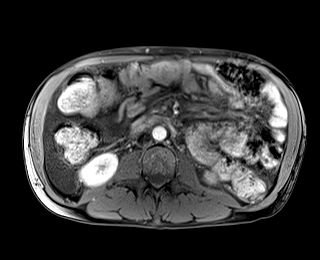
[im 36/72]
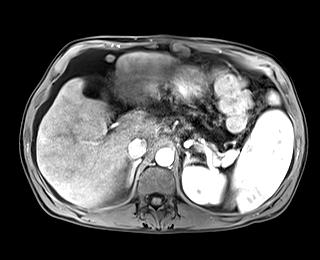
[im 72/72]
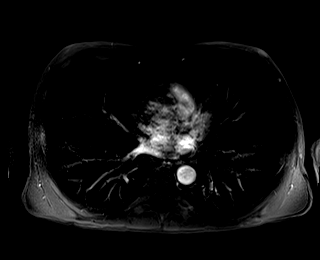

[Series 14: T1 dynamic fat-sat · axial · 3.2mm · 1.19mm/px · z∈[-145,+82]mm · 3 of 72 slices shown (2 of 5)]
[im 1/72]
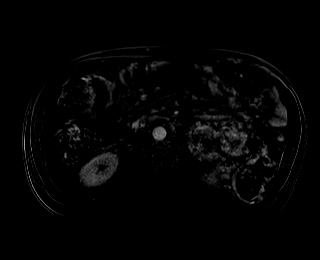
[im 36/72]
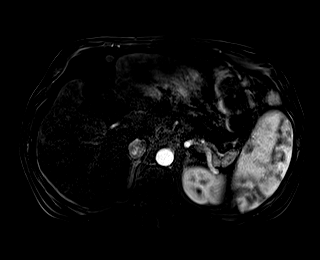
[im 72/72]
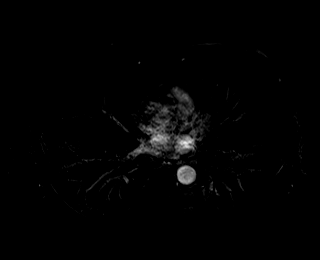

[Series 15: T1 dynamic fat-sat post-contrast · axial · 3.2mm · 1.19mm/px · z∈[-145,+82]mm · 3 of 72 slices shown (2 of 4)]
[im 1/72]
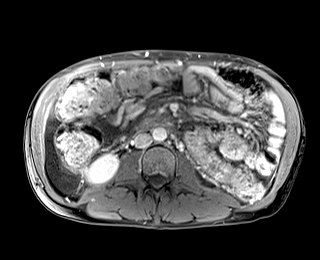
[im 36/72]
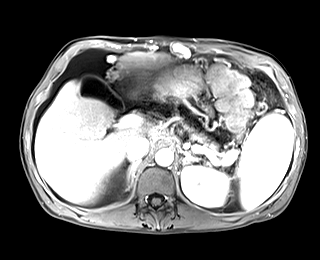
[im 72/72]
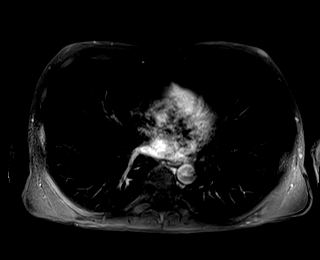

[Series 16: T1 dynamic fat-sat · axial · 3.2mm · 1.19mm/px · z∈[-145,+82]mm · 3 of 72 slices shown (3 of 5)]
[im 1/72]
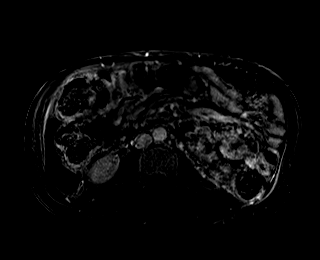
[im 36/72]
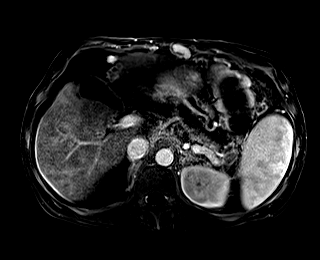
[im 72/72]
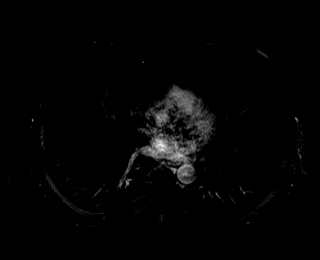

[Series 17: T1 dynamic fat-sat post-contrast · axial · 3.2mm · 1.19mm/px · z∈[-145,+82]mm · 3 of 72 slices shown (3 of 4)]
[im 1/72]
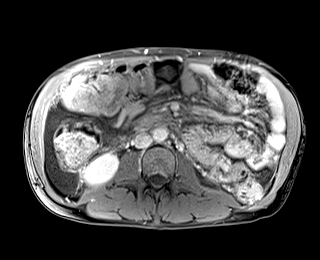
[im 36/72]
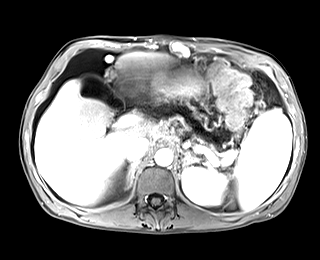
[im 72/72]
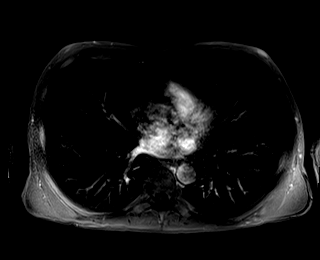

[Series 18: T1 dynamic fat-sat · axial · 3.2mm · 1.19mm/px · z∈[-145,+82]mm · 3 of 72 slices shown (4 of 5)]
[im 1/72]
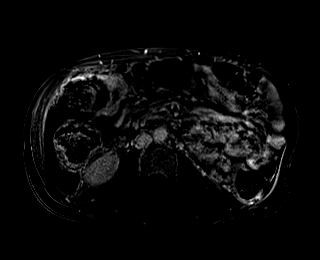
[im 36/72]
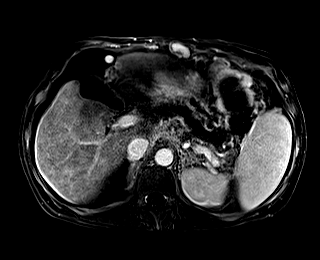
[im 72/72]
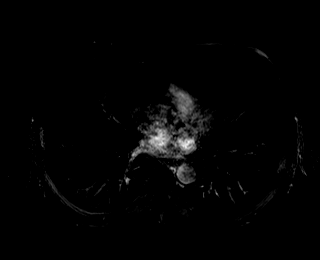

[Series 19: T1 dynamic post-contrast · coronal · 3.2mm · 1.31mm/px · 3 of 72 slices shown]
[im 1/72]
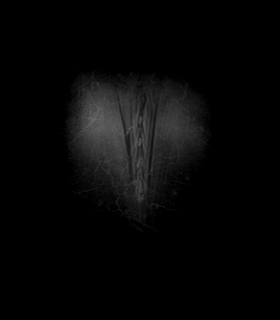
[im 36/72]
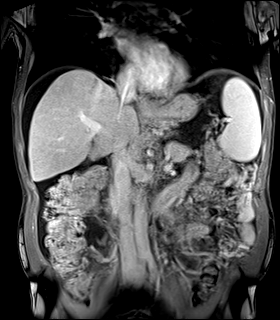
[im 72/72]
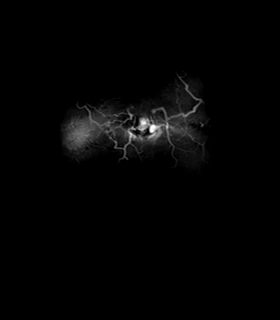

[Series 20: T1 dynamic fat-sat post-contrast · axial · 3.2mm · 1.19mm/px · z∈[-145,+82]mm · 3 of 72 slices shown (4 of 4)]
[im 1/72]
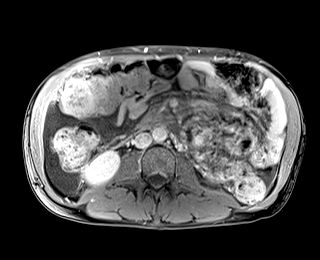
[im 36/72]
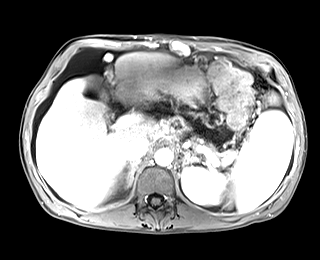
[im 72/72]
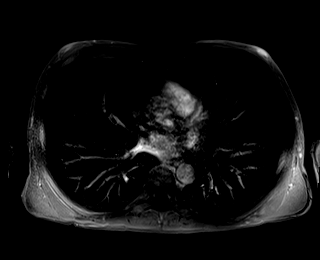

[Series 21: T1 dynamic fat-sat · axial · 3.2mm · 1.19mm/px · z∈[-145,+82]mm · 3 of 72 slices shown (5 of 5)]
[im 1/72]
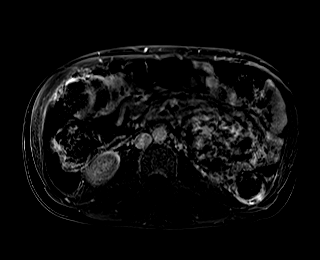
[im 36/72]
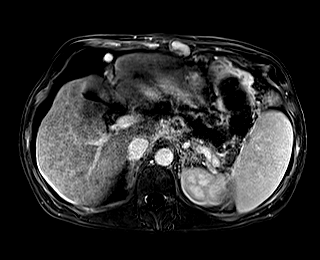
[im 72/72]
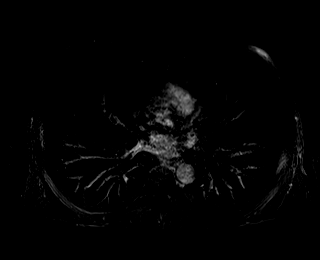

[48 of 48 positions shown; findings below may reference images not displayed]

FINDINGS: Lower chest: No effusion or consolidative changes.

Hepatobiliary: Cirrhotic hepatic morphology. Area of concern is
arising from hepatic subsegment V with lobulated macronodular
contour measuring 5.7 x 4.2 cm showing mild intrinsic T1
hyperintensity with minimal differential signal on T2 perhaps
slightly less signal on T2 than surrounding liver. Caudate minimally
enlarged. Findings of portal hypertension as before including
ascites and splenomegaly.

No hypervascular enhancement. Enhancement slightly less than
background liver on later phases.

Observation 1: Small foci of hyperenhancement on early phase
scattered throughout the RIGHT hemi liver show no signs of washout,
for instance on image 27 of series 13 and 8 mm area in the RIGHT
hepatic lobe is noted.

Observation 2: Subtle hypointense area on delayed phase raising the
question of area with washout measuring 13 mm. Better displayed on
coronal images (image 31/19)

Replaced RIGHT hepatic artery arises from the SMA.

Portal vein is patent and there are signs of portosystemic
collaterals including recanalized umbilical vein and body wall
collaterals. No signs of splenic artery aneurysm.

Pancreas: Normal intrinsic T1 signal. No ductal dilation or sign of
inflammation. No focal lesion.

Spleen:  Splenomegaly as before.

Adrenals/Urinary Tract: No masses identified. No evidence of
hydronephrosis.

Stomach/Bowel: No acute gastrointestinal process to the extent
evaluated on abdominal MRI. Signs of esophageal and likely gastric
varices.

Vascular/Lymphatic: Portosystemic collaterals. Patent portal vein.
Normal caliber of the abdominal aorta. No adenopathy in the abdomen.

Other: Abdominal ascites, moderate volume diminished from previous
imaging.

Musculoskeletal: No acute musculoskeletal findings to the extent
evaluated. No suspicious bone lesion.
IMPRESSION: Signs of cirrhosis and marked portal hypertension as before,
diminished volume of ascites.

Portosystemic collaterals are significant with large recanalized
umbilical vein and body wall collaterals as well as esophageal
varices.

Area of concern arising from hepatic subsegment V is lobulated
macronodular contour with mild intrinsic T1 hyperintensity. This is
favored to represent macronodular changes without current signs of
neoplasm/HCC.

Small foci of hyperenhancement on early phase imaging without signs
of washout in the RIGHT hepatic lobe, potentially transient hepatic
arterial/intensity differences. Characterized at this time as
SYLVESTRE category 3. Suggest MRI at 3-6 months for further
assessment.

Subtle lesion with washout in the RIGHT hemi liver also
characterized at this time as SYLVESTRE category 3. Attention on
subsequent imaging as outlined above.

## 2021-05-28 MED ORDER — GADOBUTROL 1 MMOL/ML IV SOLN
7.5000 mL | Freq: Once | INTRAVENOUS | Status: AC | PRN
  Administered 2021-05-28: 7.5 mL via INTRAVENOUS

## 2021-06-04 ENCOUNTER — Telehealth: Payer: 59 | Admitting: Gastroenterology

## 2021-06-04 ENCOUNTER — Other Ambulatory Visit: Payer: Self-pay

## 2021-06-04 ENCOUNTER — Encounter: Payer: Self-pay | Admitting: Gastroenterology

## 2021-06-04 DIAGNOSIS — R772 Abnormality of alphafetoprotein: Secondary | ICD-10-CM | POA: Diagnosis not present

## 2021-06-04 DIAGNOSIS — E8801 Alpha-1-antitrypsin deficiency: Secondary | ICD-10-CM | POA: Diagnosis not present

## 2021-06-04 DIAGNOSIS — I8510 Secondary esophageal varices without bleeding: Secondary | ICD-10-CM | POA: Diagnosis not present

## 2021-06-04 DIAGNOSIS — K7682 Hepatic encephalopathy: Secondary | ICD-10-CM

## 2021-06-04 DIAGNOSIS — K7031 Alcoholic cirrhosis of liver with ascites: Secondary | ICD-10-CM | POA: Diagnosis not present

## 2021-06-04 NOTE — Addendum Note (Signed)
Addended by: Wayna Chalet on: 06/04/2021 11:13 AM   Modules accepted: Orders

## 2021-06-04 NOTE — Progress Notes (Signed)
Brian Vasquez , MD [address]  [place]  [place], [place] [address]  Main: [phone]  Fax: [phone]   Primary Care Physician: Brian Bellows, MD  Virtual Visit via Video Note  I connected with patient on 06/04/21 at  8:15 AM EST by video and verified that I am speaking with the correct person using two identifiers.   I discussed the limitations, risks, security and privacy concerns of performing an evaluation and management service by video  and the availability of in person appointments. I also discussed with the patient that there may be a patient responsible charge related to this service. The patient expressed understanding and agreed to proceed.  Location of Patient: Home Location of Provider: Home Persons involved: Patient and provider only   History of Present Illness:   C/c : Follow up liver cirrhosis    HPI: Brian Vasquez is a 45 y.o. male   Summary of history :   I was consulted to see him on 04/13/2021 when he presented to the emergency room with abdominal distention and was found to have significant ascites.  In the ER he underwent an ultrasound which showed cirrhosis with a 4.9 cm masslike lesion in the caudate lobe.  He underwent an MRI of the liver without contrast and with contrast to look at the lesion and showed no concern for malignancy simply noted focal nodular area of the liver.  Plan was to correlate with AFP which was elevated at 79.5.  Acetic fluid showed no evidence of SBP and likely was due to portal hypertension.  INR was 1.7.  Hemoglobin 13.4.  Hepatitis B core antibody was positive hepatitis C antibody was reactive hepatitis B surface antibody was negative and hepatitis C antibody was negative surface antigen was negative Was admitted to the hospital for hyponatremia and discharged on 05/12/2021. Likely secondary to combination of over diuresis and salt restriction. He didn't get his labs a few days after his last increase in diurertics.     05/12/2021: EGD:  3 bands placed over non bleeding large esophageal varices. Duodenal bx shows no evidence of celiac disease.  Low alpha 1 antitrypsin levels, Elevated Ferritin and iron % saturation 88. INR 1.3 Ascitic fluid  shows SAAG suggestive of portal hypertension. AFP elevated 63.  MRI showed no clear mass but since AFP still elevated will plan further evaluation    Interval history 05/19/2021-06/04/2021   05/29/2021 MRI liver with and without contrast shows no evidence of HCC but does another area which shows hyperenhancement and repeat MRI in 3 months has been recommended.  Features of portal hypertension seen.   Complains of some cramping , intermittent, taking aldactone 100 mg BID, I explained that it wasn't the dose that I had recommended . We had recommended 100 mg once day . NSAID's. -none . Taking lactulose and xifaxan - taking both daily , having 1-2 bowel movements a day .    Says weight has been stable.   Needs appointment for EGD end of December .        Current Outpatient Medications  Medication Sig Dispense Refill   folic acid (FOLVITE) 1 MG tablet Take 1 tablet (1 mg total) by mouth daily.     furosemide (LASIX) 40 MG tablet Take 40 mg by mouth.     ibuprofen (ADVIL) 200 MG tablet Take 400 mg by mouth every 6 (six) hours as needed for mild pain.     lactulose (CHRONULAC) 10 GM/15ML solution Take 15 mLs (  10 g total) by mouth 2 (two) times daily. 236 mL 6   Melatonin 10 MG TABS Take 20 mg by mouth at bedtime.     rifaximin (XIFAXAN) 550 MG TABS tablet Take 1 tablet (550 mg total) by mouth 2 (two) times daily. 180 tablet 3   spironolactone (ALDACTONE) 100 MG tablet Take 200 mg by mouth daily.     thiamine 100 MG tablet Take 1 tablet (100 mg total) by mouth daily.     No current facility-administered medications for this visit.    Allergies as of 06/04/2021   (No Known Allergies)    Review of Systems:    All systems reviewed and negative except where noted  in HPI.  General Appearance:    Alert, cooperative, no distress, appears stated age  Head:    Normocephalic, without obvious abnormality, atraumatic  Eyes:    PERRL, conjunctiva/corneas clear,  Ears:    Grossly normal hearing    Neurologic:  Grossly normal    Observations/Objective:  Labs: CMP     Component Value Date/Time   NA 130 (L) 05/21/2021 1550   K 5.0 05/21/2021 1550   CL 95 (L) 05/21/2021 1550   CO2 22 05/21/2021 1550   GLUCOSE 112 (H) 05/21/2021 1550   GLUCOSE 109 (H) 05/12/2021 0755   BUN 13 05/21/2021 1550   CREATININE 0.81 05/21/2021 1550   CALCIUM 9.3 05/21/2021 1550   PROT 7.1 05/21/2021 1550   ALBUMIN 3.7 (L) 05/21/2021 1550   AST 141 (H) 05/21/2021 1550   ALT 122 (H) 05/21/2021 1550   ALKPHOS 293 (H) 05/21/2021 1550   BILITOT 3.2 (H) 05/21/2021 1550   GFRNONAA >60 05/12/2021 0755   Lab Results  Component Value Date   WBC 10.1 05/21/2021   HGB 14.3 05/21/2021   HCT 39.9 05/21/2021   MCV 91 05/21/2021   PLT 137 (L) 05/21/2021    Imaging Studies: MR LIVER W WO CONTRAST  Result Date: 05/29/2021 CLINICAL DATA:  45 year old male with history of cirrhosis and potential hepatic mass which was noted on prior ultrasound. Also previous MRI was performed which was limited. EXAM: MRI ABDOMEN WITHOUT AND WITH CONTRAST TECHNIQUE: Multiplanar multisequence MR imaging of the abdomen was performed both before and after the administration of intravenous contrast. CONTRAST:  7.33mL GADAVIST GADOBUTROL 1 MMOL/ML IV SOLN COMPARISON:  04/13/2021. FINDINGS: Lower chest: No effusion or consolidative changes. Hepatobiliary: Cirrhotic hepatic morphology. Area of concern is arising from hepatic subsegment V with lobulated macronodular contour measuring 5.7 x 4.2 cm showing mild intrinsic T1 hyperintensity with minimal differential signal on T2 perhaps slightly less signal on T2 than surrounding liver. Caudate minimally enlarged. Findings of portal hypertension as before including  ascites and splenomegaly. No hypervascular enhancement. Enhancement slightly less than background liver on later phases. Observation 1: Small foci of hyperenhancement on early phase scattered throughout the RIGHT hemi liver show no signs of washout, for instance on image 27 of series 13 and 8 mm area in the RIGHT hepatic lobe is noted. Observation 2: Subtle hypointense area on delayed phase raising the question of area with washout measuring 13 mm. Better displayed on coronal images (image 31/19) Replaced RIGHT hepatic artery arises from the SMA. Portal vein is patent and there are signs of portosystemic collaterals including recanalized umbilical vein and body wall collaterals. No signs of splenic artery aneurysm. Pancreas: Normal intrinsic T1 signal. No ductal dilation or sign of inflammation. No focal lesion. Spleen:  Splenomegaly as before. Adrenals/Urinary Tract: No masses identified. No  evidence of hydronephrosis. Stomach/Bowel: No acute gastrointestinal process to the extent evaluated on abdominal MRI. Signs of esophageal and likely gastric varices. Vascular/Lymphatic: Portosystemic collaterals. Patent portal vein. Normal caliber of the abdominal aorta. No adenopathy in the abdomen. Other: Abdominal ascites, moderate volume diminished from previous imaging. Musculoskeletal: No acute musculoskeletal findings to the extent evaluated. No suspicious bone lesion. IMPRESSION: Signs of cirrhosis and marked portal hypertension as before, diminished volume of ascites. Portosystemic collaterals are significant with large recanalized umbilical vein and body wall collaterals as well as esophageal varices. Area of concern arising from hepatic subsegment V is lobulated macronodular contour with mild intrinsic T1 hyperintensity. This is favored to represent macronodular changes without current signs of neoplasm/HCC. Small foci of hyperenhancement on early phase imaging without signs of washout in the RIGHT hepatic lobe,  potentially transient hepatic arterial/intensity differences. Characterized at this time as LI-RADS category 3. Suggest MRI at 3-6 months for further assessment. Subtle lesion with washout in the RIGHT hemi liver also characterized at this time as LI-RADS category 3. Attention on subsequent imaging as outlined above. Electronically Signed   By: Zetta Bills M.D.   On: 05/29/2021 14:25    Assessment and Plan:   Brian Vasquez is a 44 y.o. y/o male recently established care with me after a  diagnosis in October 3532 with alcoholic liver cirrhosis, decompensation with ascites presented to the ER with abdominal distention and acute liver failure, non bleeding esophageal varices that have been banded.  Concern for masslike lesion on ultrasound but no mass seen on MRI but AFP has been elevated.  Repeat MRI requested with and without gadolinium.  Immune to hepatitis A but not hepatitis B, has commenced vaccination.    Did not tolerate 200 mg of Aldactone and 40 mg of Lasix as he developed severe hyponatremia.  Positive tissue transglutaminase antibody but negative biopsies of the duodenum for celiac disease.  Tests for autoimmune liver disease was negative.  Elevated ferritin, very low alpha-1 antitrypsin levels, persistently elevated AFP   Plan 1.  No NSAIDs 2.  Check daily weights and weight is going up to give our office a call 3.  Check CMP to ensure hyponatremia is not getting any worse. Repeat labs in 2 weeks again.  At last visit had strongly stated that he stay on aldactopne 100 mg a day but for some reason he had reverted top 200 mg a day and lasix 40 mg a day. Advised to hold off on all diuretics till we get labs today . 4.  Low-sodium diet less than 2000 mg/day 5.  Elevated AFP: .  Repeat MRI in February 2023 6.  EGD for surveillance of esophageal varices towards the end of December 7.  Continue to stay off all alcohol 8.  Complete hepatitis B vaccination 9.  Follow-up with [facility] hepatology to  evaluate for alpha-1 antitrypsin deficiency, elevated AFP, elevated and iron percentage saturation.  Very likely will need liver biopsy which I would suggest to be done at [facility] as they have a specialized liver pathologist.  I have ordered HFE gene mutation test, 10.  Hepatic encephalopathy commence on lactulose titrated to soft bowel movement per day commence on Xifaxan   I have discussed alternative options, risks & benefits,  which include, but are not limited to, bleeding, infection, perforation,respiratory complication & drug reaction.  The patient agrees with this plan & written consent will be obtained.    I have discussed alternative options, risks & benefits,  which include, but  are not limited to, bleeding, infection, perforation,respiratory complication & drug reaction.  The patient agrees with this plan & written consent will be obtained.     F/u with me December 29th December   I provided 18 minutes of face-to-face time during this encounter.  Dr Brian Bellows MD,MRCP [facility]) Gastroenterology/Hepatology Pager: [phone]   Speech recognition software was used to dictate this note.

## 2021-06-06 DIAGNOSIS — K7031 Alcoholic cirrhosis of liver with ascites: Secondary | ICD-10-CM

## 2021-06-06 LAB — CBC WITH DIFFERENTIAL/PLATELET
Basophils Absolute: 0.1 10*3/uL (ref 0.0–0.2)
Basos: 1 %
EOS (ABSOLUTE): 0.2 10*3/uL (ref 0.0–0.4)
Eos: 2 %
Hematocrit: 39.9 % (ref 37.5–51.0)
Hemoglobin: 14.4 g/dL (ref 13.0–17.7)
Immature Grans (Abs): 0 10*3/uL (ref 0.0–0.1)
Immature Granulocytes: 0 %
Lymphocytes Absolute: 1.6 10*3/uL (ref 0.7–3.1)
Lymphs: 18 %
MCH: 32.8 pg (ref 26.6–33.0)
MCHC: 36.1 g/dL — ABNORMAL HIGH (ref 31.5–35.7)
MCV: 91 fL (ref 79–97)
Monocytes Absolute: 0.8 10*3/uL (ref 0.1–0.9)
Monocytes: 9 %
Neutrophils Absolute: 6.4 10*3/uL (ref 1.4–7.0)
Neutrophils: 70 %
Platelets: 156 10*3/uL (ref 150–450)
RBC: 4.39 x10E6/uL (ref 4.14–5.80)
RDW: 14.2 % (ref 11.6–15.4)
WBC: 9.1 10*3/uL (ref 3.4–10.8)

## 2021-06-06 LAB — COMPREHENSIVE METABOLIC PANEL
ALT: 110 IU/L — ABNORMAL HIGH (ref 0–44)
AST: 119 IU/L — ABNORMAL HIGH (ref 0–40)
Albumin/Globulin Ratio: 1.1 — ABNORMAL LOW (ref 1.2–2.2)
Albumin: 3.6 g/dL — ABNORMAL LOW (ref 4.0–5.0)
Alkaline Phosphatase: 237 IU/L — ABNORMAL HIGH (ref 44–121)
BUN/Creatinine Ratio: 17 (ref 9–20)
BUN: 13 mg/dL (ref 6–24)
Bilirubin Total: 2.3 mg/dL — ABNORMAL HIGH (ref 0.0–1.2)
CO2: 22 mmol/L (ref 20–29)
Calcium: 8.9 mg/dL (ref 8.7–10.2)
Chloride: 101 mmol/L (ref 96–106)
Creatinine, Ser: 0.75 mg/dL — ABNORMAL LOW (ref 0.76–1.27)
Globulin, Total: 3.3 g/dL (ref 1.5–4.5)
Glucose: 98 mg/dL (ref 70–99)
Potassium: 4.6 mmol/L (ref 3.5–5.2)
Sodium: 133 mmol/L — ABNORMAL LOW (ref 134–144)
Total Protein: 6.9 g/dL (ref 6.0–8.5)
eGFR: 113 mL/min/{1.73_m2} (ref >59)

## 2021-06-06 LAB — PROTIME-INR
INR: 1.2 (ref 0.9–1.2)
Prothrombin Time: 12.8 s — ABNORMAL HIGH (ref 9.1–12.0)

## 2021-06-09 NOTE — Telephone Encounter (Signed)
Try bentyl 10 mg PRN

## 2021-06-10 ENCOUNTER — Other Ambulatory Visit: Payer: Self-pay

## 2021-06-10 MED ORDER — DICYCLOMINE HCL 10 MG PO CAPS: 10.0000 mg | ORAL_CAPSULE | Freq: Three times a day (TID) | ORAL | 0 refills | Status: DC

## 2021-06-19 ENCOUNTER — Other Ambulatory Visit: Payer: Self-pay | Admitting: Gastroenterology

## 2021-06-20 LAB — BASIC METABOLIC PANEL
BUN/Creatinine Ratio: 17 (ref 9–20)
BUN: 13 mg/dL (ref 6–24)
CO2: 23 mmol/L (ref 20–29)
Calcium: 8.7 mg/dL (ref 8.7–10.2)
Chloride: 102 mmol/L (ref 96–106)
Creatinine, Ser: 0.75 mg/dL — ABNORMAL LOW (ref 0.76–1.27)
Glucose: 99 mg/dL (ref 70–99)
Potassium: 4.7 mmol/L (ref 3.5–5.2)
Sodium: 137 mmol/L (ref 134–144)
eGFR: 113 mL/min/{1.73_m2} (ref >59)

## 2021-06-23 ENCOUNTER — Telehealth: Payer: Self-pay

## 2021-06-23 NOTE — Telephone Encounter (Signed)
Pt notified of lab results through mychart.  

## 2021-06-23 NOTE — Telephone Encounter (Signed)
-----   Message from Lucilla Lame, MD sent at 06/23/2021  1:04 PM EST ----- Please let the patient know that lab sent off by Dr. Vicente Males to see if his sodium was getting any worse actually showed that his sodium had completely returned to normal.

## 2021-06-24 ENCOUNTER — Ambulatory Visit: Payer: 59 | Admitting: Gastroenterology

## 2021-06-26 ENCOUNTER — Ambulatory Visit (INDEPENDENT_AMBULATORY_CARE_PROVIDER_SITE_OTHER): Payer: 59 | Admitting: Gastroenterology

## 2021-06-26 ENCOUNTER — Encounter: Payer: Self-pay | Admitting: Gastroenterology

## 2021-06-26 VITALS — BP 146/90 | HR 87 | Temp 98.0°F | Ht 70.0 in | Wt 169.6 lb

## 2021-06-26 DIAGNOSIS — R772 Abnormality of alphafetoprotein: Secondary | ICD-10-CM

## 2021-06-26 DIAGNOSIS — K7031 Alcoholic cirrhosis of liver with ascites: Secondary | ICD-10-CM | POA: Diagnosis not present

## 2021-06-26 NOTE — Progress Notes (Signed)
[name], MD [address]  [place]  [place], [place] [address]  Main: [phone]  Fax: [phone] Pager: [phone]   Primary Care Physician: [name], MD  Primary Gastroenterologist:  Dr. Vicente Males  Chief Complaint  Patient presents with   Follow-up    HPI: Brian Vasquez is a 45 y.o. male with history of decompensated cirrhosis of liver with ascites, esophageal varices s/p ligation, elevated AFP, small liver lesion, alpha-1 antitrypsin deficiency.  Patient has history of recurrent ascites s/p therapeutic paracentesis, currently maintained on diuretics Lasix 20 mg and spironolactone 100 mg daily.  He was evaluated by liver transplant specialist at [facility], Dr. Manuella Ghazi on 06/20/2021.  Patient was discussed at tumor board at [facility] and decision was made to pursue liver biopsy to evaluate liver lesion given history of cirrhosis and elevated AFP levels.  Patient also stopped taking lactulose and Xifaxan.  He thought he was taking these medications for abdominal cramps.  He completed Bentyl.  He reports having regular bowel movements and he is no longer experiencing abdominal cramps.  He is maintaining low-sodium diet.  He denies any worsening of abdominal distention, denies any swelling of legs.  He denies black stools.  His most recent labs from 12/16 revealed normal hemoglobin, serum sodium 138.  Patient does not have any concerns today  Patient is scheduled to undergo EGD with Dr. Vicente Males in first week of January  Current Outpatient Medications:    folic acid (FOLVITE) 1 MG tablet, Take 1 tablet (1 mg total) by mouth daily., Disp: , Rfl:    furosemide (LASIX) 20 MG tablet, TAKE 2 TABLETS (40 MG TOTAL) BY MOUTH DAILY., Disp: 60 tablet, Rfl: 1   Melatonin 10 MG TABS, Take 20 mg by mouth at bedtime., Disp: , Rfl:    spironolactone (ALDACTONE) 100 MG tablet, Take 100 mg by mouth daily., Disp: , Rfl:    thiamine 100 MG tablet, Take 1 tablet (100 mg total) by mouth daily., Disp: , Rfl:     dicyclomine (BENTYL) 10 MG capsule, Take 1 capsule (10 mg total) by mouth 4 (four) times daily -  before meals and at bedtime. (Patient not taking: Reported on 06/26/2021), Disp: 20 capsule, Rfl: 0   lactulose (CHRONULAC) 10 GM/15ML solution, Take 15 mLs (10 g total) by mouth 2 (two) times daily. (Patient not taking: Reported on 06/26/2021), Disp: 236 mL, Rfl: 6   rifaximin (XIFAXAN) 550 MG TABS tablet, Take 1 tablet (550 mg total) by mouth 2 (two) times daily. (Patient not taking: Reported on 06/26/2021), Disp: 180 tablet, Rfl: 3    Allergies as of 06/26/2021   (No Known Allergies)    NSAIDs: None  Antiplts/Anticoagulants/Anti thrombotics: None  GI procedures: Reviewed  ROS:  General: Negative for anorexia, weight loss, fever, chills, fatigue, weakness. ENT: Negative for hoarseness, difficulty swallowing , nasal congestion. CV: Negative for chest pain, angina, palpitations, dyspnea on exertion, peripheral edema.  Respiratory: Negative for dyspnea at rest, dyspnea on exertion, cough, sputum, wheezing.  GI: See history of present illness. GU:  Negative for dysuria, hematuria, urinary incontinence, urinary frequency, nocturnal urination.  Endo: Negative for unusual weight change.    Physical Examination:   BP (!) 146/90 (BP Location: Left Arm, Patient Position: Sitting, Cuff Size: Normal)    Pulse 87    Temp 98 F (36.7 C) (Oral)    Ht 5\' 10"  (1.778 m)    Wt 169 lb 9.6 oz (76.9 kg)    BMI 24.34 kg/m  General: Well-nourished, well-developed in no acute distress.  Eyes: No icterus. Conjunctivae pink. Mouth: Oropharyngeal mucosa moist and pink , no lesions erythema or exudate. Lungs: Clear to auscultation bilaterally. Non-labored. Heart: Regular rate and rhythm, no murmurs rubs or gallops.  Abdomen: Bowel sounds are normal, nontender, mildly distended, no hepatosplenomegaly or masses, no hernia , no rebound or guarding.   Extremities: No lower extremity edema. No clubbing or  deformities. Neuro: Alert and oriented x 3.  Grossly intact. Skin: Warm and dry, no jaundice.   Psych: Alert and cooperative, normal mood and affect.   Imaging Studies: MR LIVER W WO CONTRAST  Result Date: 05/29/2021 CLINICAL DATA:  45 year old male with history of cirrhosis and potential hepatic mass which was noted on prior ultrasound. Also previous MRI was performed which was limited. EXAM: MRI ABDOMEN WITHOUT AND WITH CONTRAST TECHNIQUE: Multiplanar multisequence MR imaging of the abdomen was performed both before and after the administration of intravenous contrast. CONTRAST:  7.79mL GADAVIST GADOBUTROL 1 MMOL/ML IV SOLN COMPARISON:  04/13/2021. FINDINGS: Lower chest: No effusion or consolidative changes. Hepatobiliary: Cirrhotic hepatic morphology. Area of concern is arising from hepatic subsegment V with lobulated macronodular contour measuring 5.7 x 4.2 cm showing mild intrinsic T1 hyperintensity with minimal differential signal on T2 perhaps slightly less signal on T2 than surrounding liver. Caudate minimally enlarged. Findings of portal hypertension as before including ascites and splenomegaly. No hypervascular enhancement. Enhancement slightly less than background liver on later phases. Observation 1: Small foci of hyperenhancement on early phase scattered throughout the RIGHT hemi liver show no signs of washout, for instance on image 27 of series 13 and 8 mm area in the RIGHT hepatic lobe is noted. Observation 2: Subtle hypointense area on delayed phase raising the question of area with washout measuring 13 mm. Better displayed on coronal images (image 31/19) Replaced RIGHT hepatic artery arises from the SMA. Portal vein is patent and there are signs of portosystemic collaterals including recanalized umbilical vein and body wall collaterals. No signs of splenic artery aneurysm. Pancreas: Normal intrinsic T1 signal. No ductal dilation or sign of inflammation. No focal lesion. Spleen:   Splenomegaly as before. Adrenals/Urinary Tract: No masses identified. No evidence of hydronephrosis. Stomach/Bowel: No acute gastrointestinal process to the extent evaluated on abdominal MRI. Signs of esophageal and likely gastric varices. Vascular/Lymphatic: Portosystemic collaterals. Patent portal vein. Normal caliber of the abdominal aorta. No adenopathy in the abdomen. Other: Abdominal ascites, moderate volume diminished from previous imaging. Musculoskeletal: No acute musculoskeletal findings to the extent evaluated. No suspicious bone lesion. IMPRESSION: Signs of cirrhosis and marked portal hypertension as before, diminished volume of ascites. Portosystemic collaterals are significant with large recanalized umbilical vein and body wall collaterals as well as esophageal varices. Area of concern arising from hepatic subsegment V is lobulated macronodular contour with mild intrinsic T1 hyperintensity. This is favored to represent macronodular changes without current signs of neoplasm/HCC. Small foci of hyperenhancement on early phase imaging without signs of washout in the RIGHT hepatic lobe, potentially transient hepatic arterial/intensity differences. Characterized at this time as LI-RADS category 3. Suggest MRI at 3-6 months for further assessment. Subtle lesion with washout in the RIGHT hemi liver also characterized at this time as LI-RADS category 3. Attention on subsequent imaging as outlined above. Electronically Signed   By: Zetta Bills M.D.   On: 05/29/2021 14:25    Assessment and Plan:   Brian Vasquez is a 45 y.o. male history of decompensated cirrhosis of liver with ascites, Nonbleeding esophageal varices  s/p ligation, history of alpha-1 antitrypsin deficiency Continue Lasix 20 mg and Aldactone 100 mg daily since his serum sodium is within normal limits and ascites under control Scheduled EGD with Dr. Vicente Males on 1/3 for variceal surveillance Patient will undergo ultrasound[facility]guided liver biopsy  at [facility] No evidence of HCC, lactulose and rifaximin have been discontinued as recommended by transplant hepatologist Continue  2gm sodium diet   Follow up with Dr. Isidor Holts transplant hepatology   Dr Sherri Sear, MD

## 2021-07-03 ENCOUNTER — Telehealth: Payer: 59 | Admitting: Gastroenterology

## 2021-07-04 ENCOUNTER — Encounter: Payer: Self-pay | Admitting: Gastroenterology

## 2021-07-08 ENCOUNTER — Ambulatory Visit
Admission: RE | Admit: 2021-07-08 | Discharge: 2021-07-08 | Disposition: A | Discharge status: Home / Self Care | Payer: 59 | Attending: Gastroenterology | Admitting: Gastroenterology

## 2021-07-08 ENCOUNTER — Encounter
Admission: RE | Disposition: A | Discharge status: Home / Self Care | Payer: Self-pay | Source: Home / Self Care | Attending: Gastroenterology

## 2021-07-08 ENCOUNTER — Ambulatory Visit: Payer: 59 | Admitting: Certified Registered Nurse Anesthetist

## 2021-07-08 ENCOUNTER — Encounter: Payer: Self-pay | Admitting: Gastroenterology

## 2021-07-08 DIAGNOSIS — K766 Portal hypertension: Secondary | ICD-10-CM

## 2021-07-08 DIAGNOSIS — K7031 Alcoholic cirrhosis of liver with ascites: Secondary | ICD-10-CM | POA: Diagnosis not present

## 2021-07-08 DIAGNOSIS — I10 Essential (primary) hypertension: Secondary | ICD-10-CM | POA: Insufficient documentation

## 2021-07-08 DIAGNOSIS — Z148 Genetic carrier of other disease: Secondary | ICD-10-CM | POA: Insufficient documentation

## 2021-07-08 DIAGNOSIS — K3189 Other diseases of stomach and duodenum: Secondary | ICD-10-CM | POA: Insufficient documentation

## 2021-07-08 DIAGNOSIS — Z87891 Personal history of nicotine dependence: Secondary | ICD-10-CM | POA: Insufficient documentation

## 2021-07-08 DIAGNOSIS — I8500 Esophageal varices without bleeding: Secondary | ICD-10-CM

## 2021-07-08 DIAGNOSIS — I8510 Secondary esophageal varices without bleeding: Secondary | ICD-10-CM | POA: Insufficient documentation

## 2021-07-08 HISTORY — PX: ESOPHAGOGASTRODUODENOSCOPY (EGD) WITH PROPOFOL: SHX5813

## 2021-07-08 HISTORY — DX: Alcoholic cirrhosis of liver with ascites: K70.31

## 2021-07-08 HISTORY — DX: Esophageal varices without bleeding: I85.00

## 2021-07-08 HISTORY — DX: Alcohol abuse, uncomplicated: F10.10

## 2021-07-08 SURGERY — ESOPHAGOGASTRODUODENOSCOPY (EGD) WITH PROPOFOL
Anesthesia: General

## 2021-07-08 MED ORDER — GLYCOPYRROLATE 0.2 MG/ML IJ SOLN
INTRAMUSCULAR | Status: AC
  Filled 2021-07-08: qty 1

## 2021-07-08 MED ORDER — GLYCOPYRROLATE 0.2 MG/ML IJ SOLN
INTRAMUSCULAR | Status: DC | PRN
Start: 2021-07-08 — End: 2021-07-08
  Administered 2021-07-08: .2 mg via INTRAVENOUS

## 2021-07-08 MED ORDER — LIDOCAINE HCL (PF) 2 % IJ SOLN
INTRAMUSCULAR | Status: AC
  Filled 2021-07-08: qty 5

## 2021-07-08 MED ORDER — SODIUM CHLORIDE 0.9 % IV SOLN: INTRAVENOUS | Status: DC

## 2021-07-08 MED ORDER — PROPOFOL 500 MG/50ML IV EMUL
INTRAVENOUS | Status: AC
  Filled 2021-07-08: qty 50

## 2021-07-08 MED ORDER — MIDAZOLAM HCL 2 MG/2ML IJ SOLN
INTRAMUSCULAR | Status: DC | PRN
  Administered 2021-07-08: 2 mg via INTRAVENOUS

## 2021-07-08 MED ORDER — PROPOFOL 10 MG/ML IV BOLUS
INTRAVENOUS | Status: DC | PRN
Start: 2021-07-08 — End: 2021-07-08
  Administered 2021-07-08: 60 mg via INTRAVENOUS
  Administered 2021-07-08: 40 mg via INTRAVENOUS

## 2021-07-08 MED ORDER — PROPOFOL 500 MG/50ML IV EMUL
INTRAVENOUS | Status: DC | PRN
  Administered 2021-07-08: 150 ug/kg/min via INTRAVENOUS

## 2021-07-08 MED ORDER — MIDAZOLAM HCL 2 MG/2ML IJ SOLN
INTRAMUSCULAR | Status: AC
  Filled 2021-07-08: qty 2

## 2021-07-08 MED ORDER — ONDANSETRON HCL 4 MG/2ML IJ SOLN
INTRAMUSCULAR | Status: DC | PRN
  Administered 2021-07-08: 4 mg via INTRAVENOUS

## 2021-07-08 MED ORDER — LIDOCAINE HCL (CARDIAC) PF 100 MG/5ML IV SOSY
PREFILLED_SYRINGE | INTRAVENOUS | Status: DC | PRN
  Administered 2021-07-08: 50 mg via INTRAVENOUS

## 2021-07-08 NOTE — Op Note (Signed)
[facility] Gastroenterology Patient Name: Brian Vasquez Procedure Date: 07/08/2021 8:29 AM MRN: [id number] Account #: [id number] Date of Birth: February 12, 1976 Admit Type: Outpatient Age: 46 Room: [facility] ENDO ROOM 2 Gender: Male Note Status: Finalized Instrument Name: Michaelle Birks [id number] Procedure:             Upper GI endoscopy Indications:           Follow-up of esophageal varices Providers:             [name] MD, MD Referring MD:          No Local Md, MD (Referring MD) Medicines:             Monitored Anesthesia Care Complications:         No immediate complications. Procedure:             Pre-Anesthesia Assessment:                        - Prior to the procedure, a History and Physical was                         performed, and patient medications, allergies and                         sensitivities were reviewed. The patient's tolerance                         of previous anesthesia was reviewed.                        - The risks and benefits of the procedure and the                         sedation options and risks were discussed with the                         patient. All questions were answered and informed                         consent was obtained.                        - ASA Grade Assessment: III - A patient with severe                         systemic disease.                        After obtaining informed consent, the endoscope was                         passed under direct vision. Throughout the procedure,                         the patient's blood pressure, pulse, and oxygen                         saturations were monitored continuously. The Endoscope  was introduced through the mouth, and advanced to the                         third part of duodenum. The upper GI endoscopy was                         accomplished with ease. The patient tolerated the                         procedure well. Findings:      The  examined duodenum was normal.      Severe portal hypertensive gastropathy was found in the entire examined       stomach.      Grade III varices were found in the lower third of the esophagus. They       were large in size. Three bands were successfully placed with complete       eradication, resulting in deflation of varices. There was no bleeding       during and at the end of the procedure.      The cardia and gastric fundus were normal on retroflexion. Impression:            - Normal examined duodenum.                        - Portal hypertensive gastropathy.                        - Grade III esophageal varices. Completely eradicated.                         Banded.                        - No specimens collected. Recommendation:        - Discharge patient to home (with escort).                        - Resume previous diet.                        - Continue present medications.                        - Repeat upper endoscopy in 4 weeks for surveillance. Procedure Code(s):     --- Professional ---                        [id number], Esophagogastroduodenoscopy, flexible,                         transoral; with band ligation of esophageal/gastric                         varices Diagnosis Code(s):     --- Professional ---                        K76.6, Portal hypertension                        K31.89, Other diseases of stomach and duodenum  I85.00, Esophageal varices without bleeding CPT copyright 2019 [employer]. All rights reserved. The codes documented in this report are preliminary and upon coder review may  be revised to meet current compliance requirements. [name], MD [name] MD, MD 07/08/2021 8:43:23 AM This report has been signed electronically. Number of Addenda: 0 Note Initiated On: 07/08/2021 8:29 AM Estimated Blood Loss:  Estimated blood loss: none.      [facility]

## 2021-07-08 NOTE — H&P (Signed)
[name], MD [address] [place], [place], [place], [address] [address], [place], [place], [place], [address] Phone: [phone]  Fax: [phone]  Primary Care Physician:  [name], MD   Pre-Procedure History & Physical: HPI:  Brian Vasquez is a 46 y.o. male is here for an endoscopy    Past Medical History:  Diagnosis Date   Alcoholic cirrhosis of liver with ascites ([place])    Cirrhosis ([place])    Esophageal varices ([place])    ETOH abuse    Hypertension    Hyponatremia 05/10/2021    Past Surgical History:  Procedure Laterality Date   ESOPHAGOGASTRODUODENOSCOPY (EGD) WITH PROPOFOL N/A 05/12/2021   Procedure: ESOPHAGOGASTRODUODENOSCOPY (EGD) WITH PROPOFOL;  Surgeon: [name], MD;  Location: [facility] ENDOSCOPY;  Service: Gastroenterology;  Laterality: N/A;    Prior to Admission medications   Medication Sig Start Date End Date Taking? Authorizing Provider  folic acid (FOLVITE) 1 MG tablet Take 1 tablet (1 mg total) by mouth daily. 04/16/21  Yes Cherene Altes, MD  furosemide (LASIX) 20 MG tablet TAKE 2 TABLETS (40 MG TOTAL) BY MOUTH DAILY. 06/20/21  Yes [name], MD  Melatonin 10 MG TABS Take 20 mg by mouth at bedtime.   Yes [provider]  spironolactone (ALDACTONE) 100 MG tablet Take 100 mg by mouth daily.   Yes [provider]  thiamine 100 MG tablet Take 1 tablet (100 mg total) by mouth daily. 04/16/21  Yes Cherene Altes, MD    Allergies as of 06/04/2021   (No Known Allergies)    History reviewed. No pertinent family history.  Social History   Socioeconomic History   Marital status: Divorced    Spouse name: Not on file   Number of children: Not on file   Years of education: Not on file   Highest education level: Not on file  Occupational History   Not on file  Tobacco Use   Smoking status: Former    Types: Cigarettes   Smokeless tobacco: Never  Vaping Use   Vaping Use: Never used  Substance and Sexual Activity    Alcohol use: Not Currently   Drug use: Not Currently   Sexual activity: Not on file  Other Topics Concern   Not on file  Social History Narrative   Not on file   Social Determinants of Health   Financial Resource Strain: Not on file  Food Insecurity: Not on file  Transportation Needs: Not on file  Physical Activity: Not on file  Stress: Not on file  Social Connections: Not on file  Intimate Partner Violence: Not on file    Review of Systems: See HPI, otherwise negative ROS  Physical Exam: BP (!) 123/94    Pulse 89    Temp (!) 96.8 F (36 C) (Temporal)    Resp 18    Ht 5\' 10"  (1.778 m)    Wt 72.6 kg    SpO2 100%    BMI 22.96 kg/m  General:   Alert,  pleasant and cooperative in NAD Head:  Normocephalic and atraumatic. Neck:  Supple; no masses or thyromegaly. Lungs:  Clear throughout to auscultation, normal respiratory effort.    Heart:  +S1, +S2, Regular rate and rhythm, No edema. Abdomen:  Soft, nontender and nondistended. Normal bowel sounds, without guarding, and without rebound.   Neurologic:  Alert and  oriented x4;  grossly normal neurologically.  Impression/Plan: Brian Vasquez is here for an endoscopy  to be performed for  evaluation of esophageal varices    Risks, benefits, limitations, and alternatives regarding endoscopy have been reviewed with the patient.  Questions have been answered.  All parties agreeable.   [name], MD  07/08/2021, 7:45 AM

## 2021-07-08 NOTE — Transfer of Care (Signed)
Immediate Anesthesia Transfer of Care Note  Patient: Brian Vasquez  Procedure(s) Performed: ESOPHAGOGASTRODUODENOSCOPY (EGD) WITH PROPOFOL  Patient Location: Endoscopy Unit  Anesthesia Type:General  Level of Consciousness: drowsy  Airway & Oxygen Therapy: Patient Spontanous Breathing  Post-op Assessment: Report given to RN and Post -op Vital signs reviewed and stable  Post vital signs: Reviewed and stable  Last Vitals:  Vitals Value Taken Time  BP 130/86 07/08/21 0846  Temp 36.3 C 07/08/21 0846  Pulse 97 07/08/21 0847  Resp 15 07/08/21 0847  SpO2 100 % 07/08/21 0847  Vitals shown include unvalidated device data.  Last Pain:  Vitals:   07/08/21 0846  TempSrc: Temporal  PainSc: Asleep         Complications: No notable events documented.

## 2021-07-08 NOTE — Anesthesia Postprocedure Evaluation (Signed)
Anesthesia Post Note  Patient: Brian Vasquez  Procedure(s) Performed: ESOPHAGOGASTRODUODENOSCOPY (EGD) WITH PROPOFOL  Patient location during evaluation: Endoscopy Anesthesia Type: General Level of consciousness: awake and alert Pain management: pain level controlled Vital Signs Assessment: post-procedure vital signs reviewed and stable Respiratory status: spontaneous breathing, nonlabored ventilation, respiratory function stable and patient connected to nasal cannula oxygen Cardiovascular status: blood pressure returned to baseline and stable Postop Assessment: no apparent nausea or vomiting Anesthetic complications: no   No notable events documented.   Last Vitals:  Vitals:   07/08/21 0846 07/08/21 0906  BP: 130/86 (!) 129/96  Pulse: 98   Resp: 20   Temp: (!) 36.3 C   SpO2: 99%     Last Pain:  Vitals:   07/08/21 0906  TempSrc:   PainSc: 0-No pain                 Precious Haws Khloi Rawl

## 2021-07-08 NOTE — Anesthesia Preprocedure Evaluation (Signed)
Anesthesia Evaluation  Patient identified by MRN, date of birth, ID band Patient awake    Reviewed: Allergy & Precautions, NPO status , Patient's Chart, lab work & pertinent test results  History of Anesthesia Complications Negative for: history of anesthetic complications  Airway Mallampati: III  TM Distance: >3 FB Neck ROM: full    Dental  (+) Chipped, Poor Dentition, Missing   Pulmonary neg shortness of breath, former smoker,    Pulmonary exam normal        Cardiovascular Exercise Tolerance: Good hypertension, (-) angina(-) Past MI and (-) DOE Normal cardiovascular exam     Neuro/Psych negative neurological ROS  negative psych ROS   GI/Hepatic negative GI ROS, Neg liver ROS, neg GERD  ,  Endo/Other  negative endocrine ROS  Renal/GU negative Renal ROS  negative genitourinary   Musculoskeletal   Abdominal   Peds  Hematology negative hematology ROS (+)   Anesthesia Other Findings Past Medical History: No date: Alcoholic cirrhosis of liver with ascites ([facility]) No date: Cirrhosis ([facility]) No date: Esophageal varices ([facility]) No date: ETOH abuse No date: Hypertension 05/10/2021: Hyponatremia  Past Surgical History: 05/12/2021: ESOPHAGOGASTRODUODENOSCOPY (EGD) WITH PROPOFOL; N/A     Comment:  Procedure: ESOPHAGOGASTRODUODENOSCOPY (EGD) WITH               PROPOFOL;  Surgeon: [name], MD;  Location: [facility]               ENDOSCOPY;  Service: Gastroenterology;  Laterality: N/A;  BMI    Body Mass Index: 22.96 kg/m      Reproductive/Obstetrics negative OB ROS                             Anesthesia Physical Anesthesia Plan  ASA: 3  Anesthesia Plan: General   Post-op Pain Management:    Induction: Intravenous  PONV Risk Score and Plan: Propofol infusion and TIVA  Airway Management Planned: Natural Airway and Nasal Cannula  Additional Equipment:   Intra-op Plan:   Post-operative  Plan:   Informed Consent: I have reviewed the patients History and Physical, chart, labs and discussed the procedure including the risks, benefits and alternatives for the proposed anesthesia with the patient or authorized representative who has indicated his/her understanding and acceptance.     Dental Advisory Given  Plan Discussed with: Anesthesiologist, CRNA and Surgeon  Anesthesia Plan Comments: (Patient consented for risks of anesthesia including but not limited to:  - adverse reactions to medications - risk of airway placement if required - damage to eyes, teeth, lips or other oral mucosa - nerve damage due to positioning  - sore throat or hoarseness - Damage to heart, brain, nerves, lungs, other parts of body or loss of life  Patient voiced understanding.)        Anesthesia Quick Evaluation

## 2021-07-09 ENCOUNTER — Encounter: Payer: Self-pay | Admitting: Gastroenterology

## 2021-07-15 ENCOUNTER — Encounter: Payer: Self-pay | Admitting: Gastroenterology

## 2021-07-17 ENCOUNTER — Other Ambulatory Visit: Payer: Self-pay

## 2021-07-17 DIAGNOSIS — K7031 Alcoholic cirrhosis of liver with ascites: Secondary | ICD-10-CM

## 2021-07-21 ENCOUNTER — Other Ambulatory Visit: Payer: Self-pay

## 2021-07-21 DIAGNOSIS — K7031 Alcoholic cirrhosis of liver with ascites: Secondary | ICD-10-CM

## 2021-07-24 ENCOUNTER — Other Ambulatory Visit: Payer: Self-pay | Admitting: Gastroenterology

## 2021-07-24 LAB — CBC
Hematocrit: 37.2 % — ABNORMAL LOW (ref 37.5–51.0)
Hemoglobin: 13.6 g/dL (ref 13.0–17.7)
MCH: 33.7 pg — ABNORMAL HIGH (ref 26.6–33.0)
MCHC: 36.6 g/dL — ABNORMAL HIGH (ref 31.5–35.7)
MCV: 92 fL (ref 79–97)
Platelets: 123 10*3/uL — ABNORMAL LOW (ref 150–450)
RBC: 4.04 x10E6/uL — ABNORMAL LOW (ref 4.14–5.80)
RDW: 13.1 % (ref 11.6–15.4)
WBC: 9.6 10*3/uL (ref 3.4–10.8)

## 2021-07-24 LAB — PROTIME-INR
INR: 1.2 (ref 0.9–1.2)
Prothrombin Time: 12.9 s — ABNORMAL HIGH (ref 9.1–12.0)

## 2021-07-25 ENCOUNTER — Encounter: Payer: Self-pay | Admitting: Gastroenterology

## 2021-08-14 ENCOUNTER — Other Ambulatory Visit: Payer: Self-pay | Admitting: Gastroenterology

## 2021-08-20 ENCOUNTER — Ambulatory Visit: Payer: 59 | Admitting: Anesthesiology

## 2021-08-20 ENCOUNTER — Ambulatory Visit
Admission: RE | Admit: 2021-08-20 | Discharge: 2021-08-20 | Disposition: A | Discharge status: Home / Self Care | Payer: 59 | Attending: Gastroenterology | Admitting: Gastroenterology

## 2021-08-20 ENCOUNTER — Encounter
Admission: RE | Disposition: A | Discharge status: Home / Self Care | Payer: Self-pay | Source: Home / Self Care | Attending: Gastroenterology

## 2021-08-20 ENCOUNTER — Encounter: Payer: Self-pay | Admitting: Gastroenterology

## 2021-08-20 DIAGNOSIS — Z87891 Personal history of nicotine dependence: Secondary | ICD-10-CM | POA: Insufficient documentation

## 2021-08-20 DIAGNOSIS — I10 Essential (primary) hypertension: Secondary | ICD-10-CM | POA: Insufficient documentation

## 2021-08-20 DIAGNOSIS — K7460 Unspecified cirrhosis of liver: Secondary | ICD-10-CM | POA: Diagnosis not present

## 2021-08-20 DIAGNOSIS — Z79899 Other long term (current) drug therapy: Secondary | ICD-10-CM | POA: Diagnosis not present

## 2021-08-20 DIAGNOSIS — K766 Portal hypertension: Secondary | ICD-10-CM | POA: Diagnosis not present

## 2021-08-20 DIAGNOSIS — K7031 Alcoholic cirrhosis of liver with ascites: Secondary | ICD-10-CM

## 2021-08-20 DIAGNOSIS — K3189 Other diseases of stomach and duodenum: Secondary | ICD-10-CM | POA: Insufficient documentation

## 2021-08-20 DIAGNOSIS — I8500 Esophageal varices without bleeding: Secondary | ICD-10-CM | POA: Diagnosis not present

## 2021-08-20 HISTORY — PX: ESOPHAGOGASTRODUODENOSCOPY (EGD) WITH PROPOFOL: SHX5813

## 2021-08-20 SURGERY — ESOPHAGOGASTRODUODENOSCOPY (EGD) WITH PROPOFOL
Anesthesia: General

## 2021-08-20 MED ORDER — PROPOFOL 10 MG/ML IV BOLUS
INTRAVENOUS | Status: AC
  Filled 2021-08-20: qty 40

## 2021-08-20 MED ORDER — PROPOFOL 10 MG/ML IV BOLUS
INTRAVENOUS | Status: DC | PRN
  Administered 2021-08-20: 130 mg via INTRAVENOUS

## 2021-08-20 MED ORDER — SODIUM CHLORIDE 0.9 % IV SOLN: INTRAVENOUS | Status: DC

## 2021-08-20 MED ORDER — LIDOCAINE HCL (CARDIAC) PF 100 MG/5ML IV SOSY
PREFILLED_SYRINGE | INTRAVENOUS | Status: DC | PRN
  Administered 2021-08-20: 100 mg via INTRAVENOUS

## 2021-08-20 NOTE — Transfer of Care (Signed)
Immediate Anesthesia Transfer of Care Note  Patient: Brian Vasquez  Procedure(s) Performed: ESOPHAGOGASTRODUODENOSCOPY (EGD) WITH PROPOFOL  Patient Location: Endoscopy Unit  Anesthesia Type:General  Level of Consciousness: awake  Airway & Oxygen Therapy: Patient Spontanous Breathing  Post-op Assessment: Report given to RN and Post -op Vital signs reviewed and stable  Post vital signs: Reviewed and stable  Last Vitals:  Vitals Value Taken Time  BP 117/82 08/20/21 0907  Temp    Pulse 83 08/20/21 0908  Resp 19 08/20/21 0908  SpO2 96 % 08/20/21 0908  Vitals shown include unvalidated device data.  Last Pain:  Vitals:   08/20/21 0801  TempSrc: Temporal         Complications: No notable events documented.

## 2021-08-20 NOTE — Op Note (Signed)
[facility] Gastroenterology Patient Name: Brian Vasquez Procedure Date: 08/20/2021 8:49 AM MRN: [id number] Account #: [id number] Date of Birth: 1976-02-09 Admit Type: Outpatient Age: 46 Room: [facility] ENDO ROOM 3 Gender: Male Note Status: Finalized Instrument Name: Upper Endoscope [id number] Procedure:             Upper GI endoscopy Indications:           Cirrhosis rule out esophageal varices, Follow-up of                         esophageal varices Providers:             [name] MD, MD Medicines:             Monitored Anesthesia Care Complications:         No immediate complications. Procedure:             Pre-Anesthesia Assessment:                        - Prior to the procedure, a History and Physical was                         performed, and patient medications, allergies and                         sensitivities were reviewed. The patient's tolerance                         of previous anesthesia was reviewed.                        - The risks and benefits of the procedure and the                         sedation options and risks were discussed with the                         patient. All questions were answered and informed                         consent was obtained.                        - ASA Grade Assessment: II - A patient with mild                         systemic disease.                        - ASA Grade Assessment: III - A patient with severe                         systemic disease.                        After obtaining informed consent, the endoscope was                         passed under direct vision. Throughout the procedure,  the patient's blood pressure, pulse, and oxygen                         saturations were monitored continuously. The Endoscope                         was introduced through the mouth, and advanced to the                         third part of duodenum. The upper GI endoscopy was                          accomplished with ease. The patient tolerated the                         procedure well. Findings:      The examined duodenum was normal.      Severe portal hypertensive gastropathy was found in the entire examined       stomach.      The examined esophagus was normal.      The cardia and gastric fundus were normal on retroflexion. Impression:            - Normal examined duodenum.                        - Portal hypertensive gastropathy.                        - Normal esophagus.                        - No specimens collected. Recommendation:        - Discharge patient to home (with escort).                        - Resume previous diet.                        - Continue present medications.                        - Repeat upper endoscopy in 1 year for surveillance. Procedure Code(s):     --- Professional ---                        [phone], Esophagogastroduodenoscopy, flexible,                         transoral; diagnostic, including collection of                         specimen(s) by brushing or washing, when performed                         (separate procedure) Diagnosis Code(s):     --- Professional ---                        K76.6, Portal hypertension                        K31.89, Other diseases of stomach and  duodenum                        K74.60, Unspecified cirrhosis of liver                        I85.00, Esophageal varices without bleeding CPT copyright 2019 American Medical Association. All rights reserved. The codes documented in this report are preliminary and upon coder review may  be revised to meet current compliance requirements. [name], MD [name] MD, MD 08/20/2021 9:05:26 AM This report has been signed electronically. Number of Addenda: 0 Note Initiated On: 08/20/2021 8:49 AM Estimated Blood Loss:  Estimated blood loss: none.      [facility]

## 2021-08-20 NOTE — H&P (Signed)
[name], MD [address], [place], [place], [place], [address] [address], [place], [place], [place], [address] Phone: [phone]  Fax: [phone]  Primary Care Physician:  [name], MD   Pre-Procedure History & Physical: HPI:  Brian Vasquez is a 46 y.o. male is here for an endoscopy    Past Medical History:  Diagnosis Date   Alcoholic cirrhosis of liver with ascites ([place])    Cirrhosis ([place])    Esophageal varices ([place])    ETOH abuse    Hypertension    Hyponatremia 05/10/2021    Past Surgical History:  Procedure Laterality Date   ESOPHAGOGASTRODUODENOSCOPY (EGD) WITH PROPOFOL N/A 05/12/2021   Procedure: ESOPHAGOGASTRODUODENOSCOPY (EGD) WITH PROPOFOL;  Surgeon: [name], MD;  Location: [facility] ENDOSCOPY;  Service: Gastroenterology;  Laterality: N/A;   ESOPHAGOGASTRODUODENOSCOPY (EGD) WITH PROPOFOL N/A 07/08/2021   Procedure: ESOPHAGOGASTRODUODENOSCOPY (EGD) WITH PROPOFOL;  Surgeon: [name], MD;  Location: [facility] ENDOSCOPY;  Service: Gastroenterology;  Laterality: N/A;    Prior to Admission medications   Medication Sig Start Date End Date Taking? Authorizing Provider  furosemide (LASIX) 20 MG tablet TAKE 2 TABLETS (40 MG TOTAL) BY MOUTH DAILY. 08/15/21  Yes [name], MD  spironolactone (ALDACTONE) 100 MG tablet TAKE 1 TABLET BY MOUTH TWICE A DAY 07/25/21  Yes [name], MD  folic acid (FOLVITE) 1 MG tablet Take 1 tablet (1 mg total) by mouth daily. 04/16/21   Cherene Altes, MD  Melatonin 10 MG TABS Take 20 mg by mouth at bedtime.    [provider]  thiamine 100 MG tablet Take 1 tablet (100 mg total) by mouth daily. 04/16/21   Cherene Altes, MD    Allergies as of 07/21/2021   (No Known Allergies)    History reviewed. No pertinent family history.  Social History   Socioeconomic History   Marital status: Divorced    Spouse name: Not on file   Number of children: Not on file   Years of education: Not on file   Highest  education level: Not on file  Occupational History   Not on file  Tobacco Use   Smoking status: Former    Types: Cigarettes   Smokeless tobacco: Never  Vaping Use   Vaping Use: Never used  Substance and Sexual Activity   Alcohol use: Not Currently   Drug use: Not Currently   Sexual activity: Not on file  Other Topics Concern   Not on file  Social History Narrative   Not on file   Social Determinants of Health   Financial Resource Strain: Not on file  Food Insecurity: Not on file  Transportation Needs: Not on file  Physical Activity: Not on file  Stress: Not on file  Social Connections: Not on file  Intimate Partner Violence: Not on file    Review of Systems: See HPI, otherwise negative ROS  Physical Exam: BP 127/89    Pulse 77    Temp (!) 97.5 F (36.4 C) (Temporal)    Resp 18    Ht 5\' 10"  (1.778 m)    Wt 77.1 kg    SpO2 99%    BMI 24.39 kg/m  General:   Alert,  pleasant and cooperative in NAD Head:  Normocephalic and atraumatic. Neck:  Supple; no masses or thyromegaly. Lungs:  Clear throughout to auscultation, normal respiratory effort.    Heart:  +S1, +S2, Regular rate and rhythm, No edema. Abdomen:  Soft, nontender and nondistended. Normal bowel sounds, without guarding,  and without rebound.   Neurologic:  Alert and  oriented x4;  grossly normal neurologically.  Impression/Plan: Brian Vasquez is here for an endoscopy  to be performed for  evaluation of esophageal varices    Risks, benefits, limitations, and alternatives regarding endoscopy have been reviewed with the patient.  Questions have been answered.  All parties agreeable.   [name], MD  08/20/2021, 8:52 AM

## 2021-08-20 NOTE — Anesthesia Postprocedure Evaluation (Signed)
Anesthesia Post Note  Patient: [name]  Procedure(s) Performed: ESOPHAGOGASTRODUODENOSCOPY (EGD) WITH PROPOFOL  Patient location during evaluation: Endoscopy Anesthesia Type: General Level of consciousness: awake and alert Pain management: pain level controlled Vital Signs Assessment: post-procedure vital signs reviewed and stable Respiratory status: spontaneous breathing, nonlabored ventilation, respiratory function stable and patient connected to nasal cannula oxygen Cardiovascular status: blood pressure returned to baseline and stable Postop Assessment: no apparent nausea or vomiting Anesthetic complications: no   No notable events documented.   Last Vitals:  Vitals:   08/20/21 0917 08/20/21 0927  BP: 120/87 128/78  Pulse: 79   Resp: 15   Temp:    SpO2: 97%     Last Pain:  Vitals:   08/20/21 0927  TempSrc:   PainSc: 0-No pain                 Martha Clan

## 2021-08-20 NOTE — Anesthesia Preprocedure Evaluation (Signed)
Anesthesia Evaluation  Patient identified by MRN, date of birth, ID band Patient awake    Reviewed: Allergy & Precautions, NPO status , Patient's Chart, lab work & pertinent test results  History of Anesthesia Complications Negative for: history of anesthetic complications  Airway Mallampati: III  TM Distance: >3 FB Neck ROM: full    Dental  (+) Chipped, Poor Dentition, Missing, Dental Advidsory Given   Pulmonary neg pulmonary ROS, neg shortness of breath, neg recent URI, former smoker,    Pulmonary exam normal        Cardiovascular Exercise Tolerance: Good hypertension, (-) angina(-) Past MI and (-) DOE Normal cardiovascular exam(-) dysrhythmias      Neuro/Psych negative neurological ROS  negative psych ROS   GI/Hepatic negative GI ROS, neg GERD  ,(+) Cirrhosis     substance abuse  alcohol use,   Endo/Other  negative endocrine ROS  Renal/GU negative Renal ROS  negative genitourinary   Musculoskeletal   Abdominal   Peds  Hematology negative hematology ROS (+)   Anesthesia Other Findings Past Medical History: No date: Alcoholic cirrhosis of liver with ascites ([place]) No date: Cirrhosis ([place]) No date: Esophageal varices ([facility]) No date: ETOH abuse No date: Hypertension 05/10/2021: Hyponatremia  Past Surgical History: 05/12/2021: ESOPHAGOGASTRODUODENOSCOPY (EGD) WITH PROPOFOL; N/A     Comment:  Procedure: ESOPHAGOGASTRODUODENOSCOPY (EGD) WITH               PROPOFOL;  Surgeon: [name], MD;  Location: [facility]               ENDOSCOPY;  Service: Gastroenterology;  Laterality: N/A;  BMI    Body Mass Index: 22.96 kg/m      Reproductive/Obstetrics negative OB ROS                             Anesthesia Physical  Anesthesia Plan  ASA: 3  Anesthesia Plan: General   Post-op Pain Management:    Induction: Intravenous  PONV Risk Score and Plan: Propofol infusion and TIVA  Airway  Management Planned: Natural Airway and Nasal Cannula  Additional Equipment:   Intra-op Plan:   Post-operative Plan:   Informed Consent: I have reviewed the patients History and Physical, chart, labs and discussed the procedure including the risks, benefits and alternatives for the proposed anesthesia with the patient or authorized representative who has indicated his/her understanding and acceptance.     Dental Advisory Given  Plan Discussed with: Anesthesiologist, CRNA and Surgeon  Anesthesia Plan Comments: (Patient consented for risks of anesthesia including but not limited to:  - adverse reactions to medications - risk of airway placement if required - damage to eyes, teeth, lips or other oral mucosa - nerve damage due to positioning  - sore throat or hoarseness - Damage to heart, brain, nerves, lungs, other parts of body or loss of life  Patient voiced understanding.)        Anesthesia Quick Evaluation

## 2021-08-21 ENCOUNTER — Ambulatory Visit
Admission: RE | Admit: 2021-08-21 | Discharge: 2021-08-21 | Disposition: A | Discharge status: Home / Self Care | Payer: 59 | Source: Ambulatory Visit | Attending: Gastroenterology | Admitting: Gastroenterology

## 2021-08-21 ENCOUNTER — Other Ambulatory Visit: Payer: Self-pay | Admitting: Gastroenterology

## 2021-08-21 ENCOUNTER — Other Ambulatory Visit: Payer: Self-pay

## 2021-08-21 DIAGNOSIS — K7031 Alcoholic cirrhosis of liver with ascites: Secondary | ICD-10-CM | POA: Insufficient documentation

## 2021-08-21 IMAGING — MR MR ABDOMEN WO/W CM
19 series · 48 of 48 positions shown · IV contrast (7ml Gadavist)
Comparison: MRI abdomen [DATE]

CLINICAL DATA: Cirrhosis follow-up

EXAM:
MRI ABDOMEN WITHOUT AND WITH CONTRAST
TECHNIQUE: Multiplanar multisequence MR imaging of the abdomen was performed
both before and after the administration of intravenous contrast.
CONTRAST:  7mL GADAVIST GADOBUTROL 1 MMOL/ML IV SOLN

[Series 2: T2 · coronal · 6.0mm · 1.19mm/px · 2 of 30 slices shown (1 of 3)]
[im 1/30]
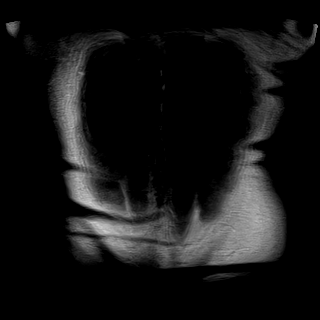
[im 30/30]
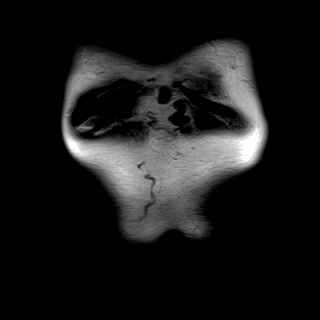

[Series 3: T2 · axial · 6.0mm · 1.19mm/px · z∈[-108,+151]mm · 2 of 37 slices shown (2 of 3)]
[im 1/37]
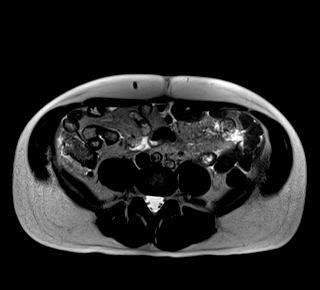
[im 37/37]
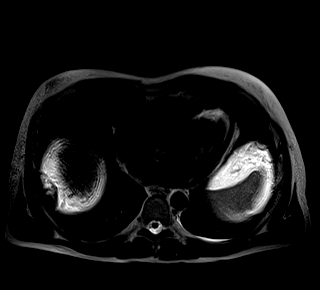

[Series 5: T2 fat-sat · axial · 6.0mm · 1.19mm/px · 1 of 37 slices shown]
[im 1/37]
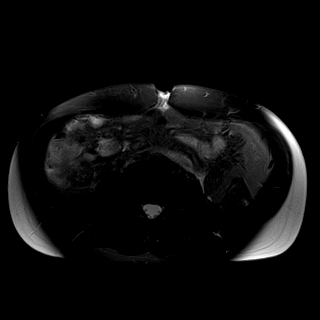

[Series 6: ax dwi_tracew · axial · 6.0mm · 1.42mm/px · z∈[-92,+174]mm · 4 of 114 slices shown]
[im 1/114]
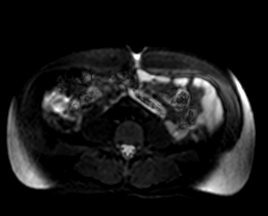
[im 38/114]
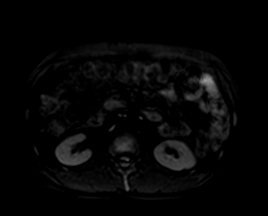
[im 76/114]
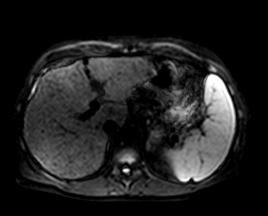
[im 114/114]
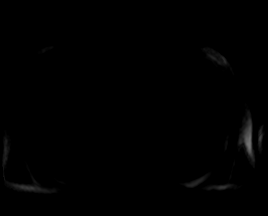

[Series 7: ax dwi_adc · axial · 6.0mm · 1.42mm/px · 1 of 38 slices shown]
[im 1/38]
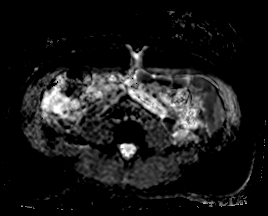

[Series 8: in & out · axial · 3.0mm · 1.19mm/px · z∈[-68,+193]mm · 3 of 88 slices shown (1 of 2)]
[im 1/88]
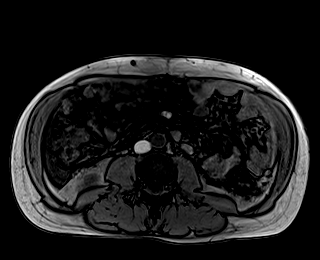
[im 44/88]
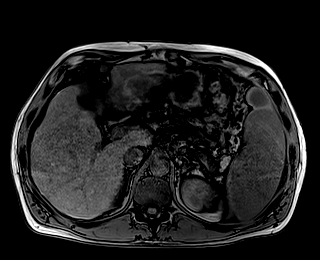
[im 88/88]
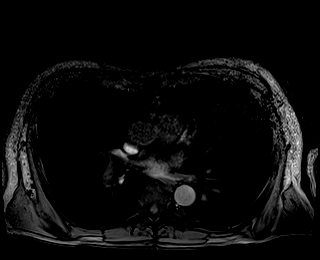

[Series 9: in & out · axial · 3.0mm · 1.19mm/px · z∈[-68,+193]mm · 3 of 88 slices shown (2 of 2)]
[im 1/88]
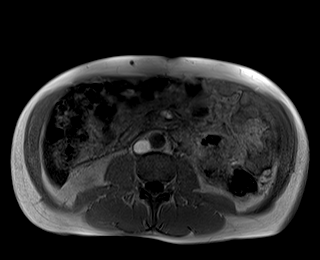
[im 44/88]
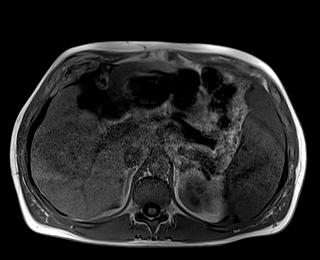
[im 88/88]
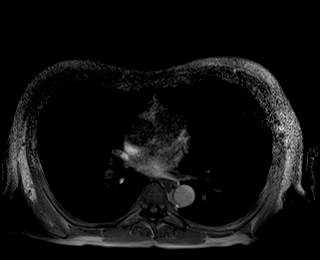

[Series 11: T2 · axial · 6.0mm · 1.19mm/px · 1 of 37 slices shown (3 of 3)]
[im 1/37]
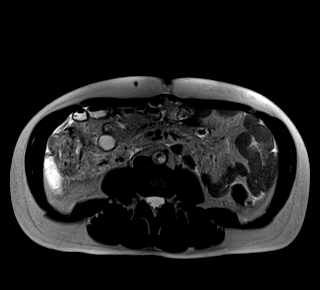

[Series 12: bSSFP · axial · 6.0mm · 0.74mm/px · 1 of 37 slices shown]
[im 1/37]
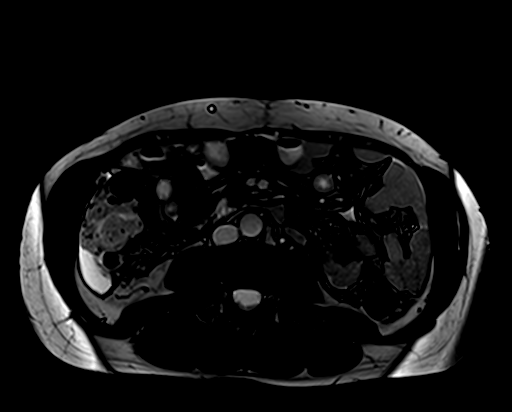

[Series 13: T1 dynamic fat-sat · axial · non-contrast · 3.0mm · 1.19mm/px · z∈[-68,+193]mm · 3 of 88 slices shown (1 of 5)]
[im 1/88]
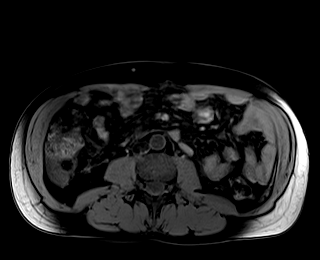
[im 44/88]
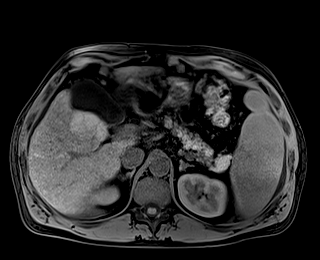
[im 88/88]
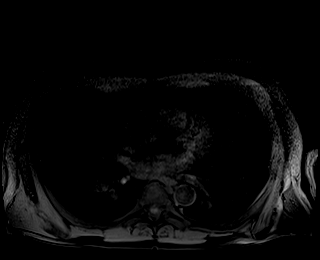

[Series 14: T1 dynamic fat-sat post-contrast · axial · 3.0mm · 1.19mm/px · z∈[-68,+193]mm · 3 of 88 slices shown (1 of 4)]
[im 1/88]
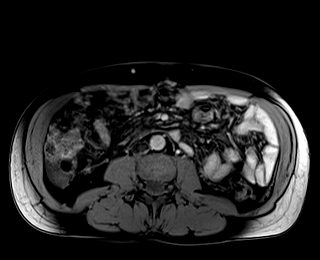
[im 44/88]
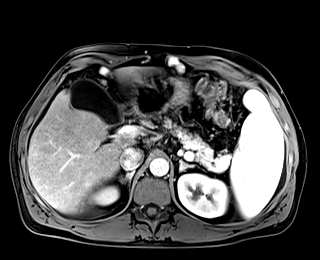
[im 88/88]
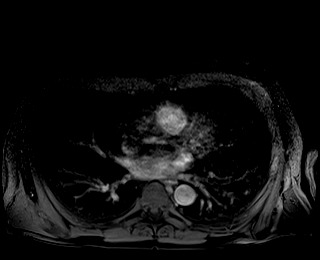

[Series 15: T1 dynamic fat-sat · axial · 3.0mm · 1.19mm/px · z∈[-68,+193]mm · 3 of 88 slices shown (2 of 5)]
[im 1/88]
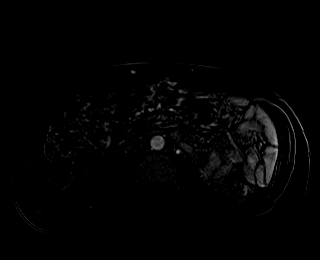
[im 44/88]
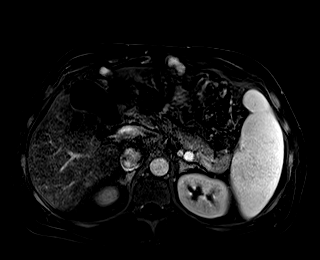
[im 88/88]
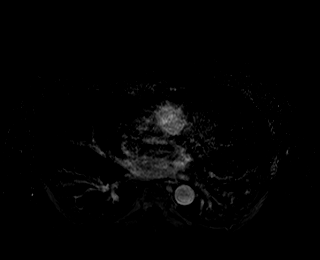

[Series 16: T1 dynamic fat-sat post-contrast · axial · 3.0mm · 1.19mm/px · z∈[-68,+193]mm · 3 of 88 slices shown (2 of 4)]
[im 1/88]
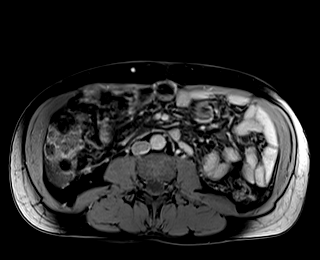
[im 44/88]
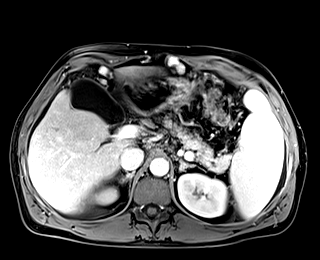
[im 88/88]
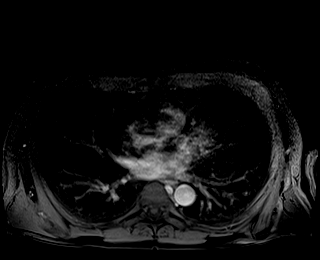

[Series 17: T1 dynamic fat-sat · axial · 3.0mm · 1.19mm/px · z∈[-68,+193]mm · 3 of 88 slices shown (3 of 5)]
[im 1/88]
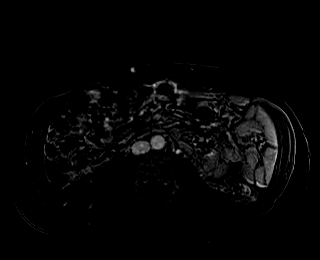
[im 44/88]
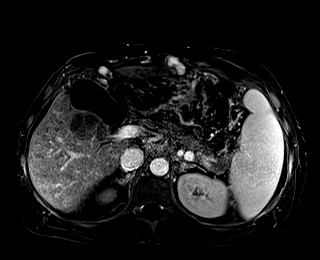
[im 88/88]
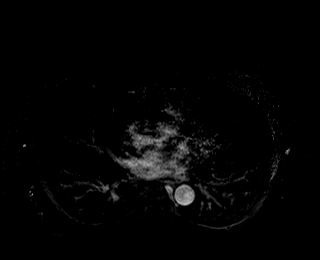

[Series 18: T1 dynamic fat-sat post-contrast · axial · 3.0mm · 1.19mm/px · z∈[-68,+193]mm · 3 of 88 slices shown (3 of 4)]
[im 1/88]
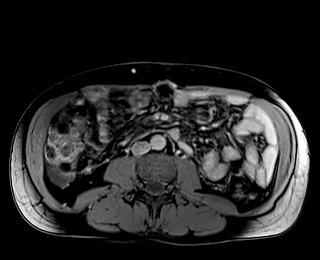
[im 44/88]
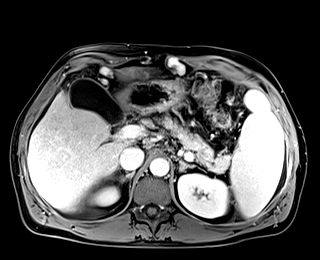
[im 88/88]
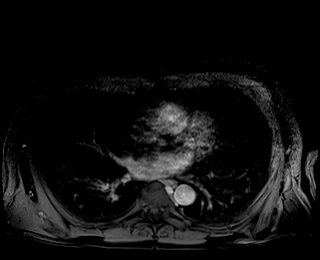

[Series 19: T1 dynamic fat-sat · axial · 3.0mm · 1.19mm/px · z∈[-68,+193]mm · 3 of 88 slices shown (4 of 5)]
[im 1/88]
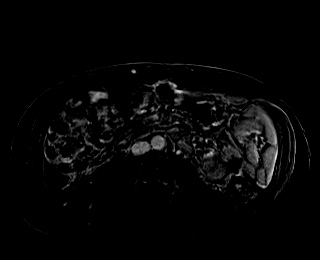
[im 44/88]
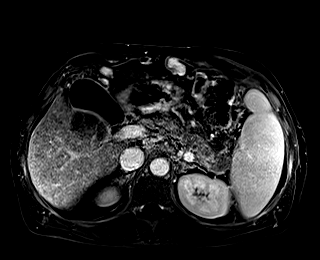
[im 88/88]
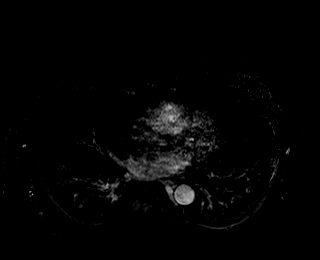

[Series 20: T1 dynamic post-contrast · coronal · 3.0mm · 1.31mm/px · 3 of 72 slices shown]
[im 1/72]
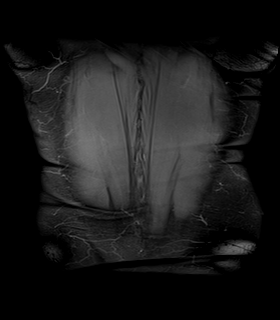
[im 36/72]
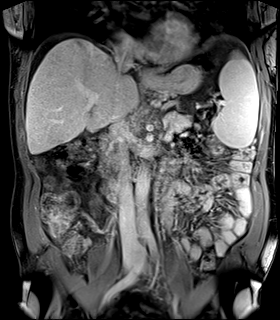
[im 72/72]
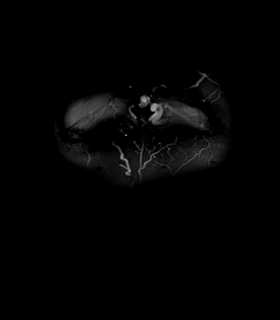

[Series 21: T1 dynamic fat-sat post-contrast · axial · 3.0mm · 1.19mm/px · z∈[-68,+193]mm · 3 of 88 slices shown (4 of 4)]
[im 1/88]
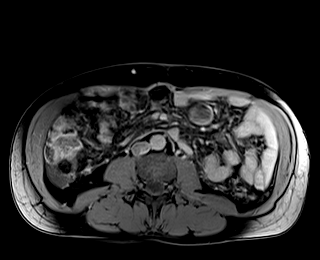
[im 44/88]
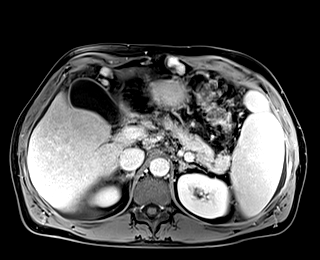
[im 88/88]
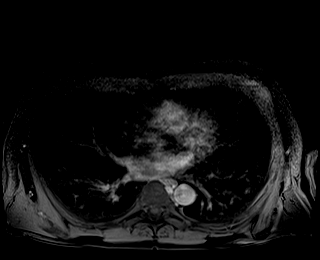

[Series 22: T1 dynamic fat-sat · axial · 3.0mm · 1.19mm/px · z∈[-68,+193]mm · 3 of 88 slices shown (5 of 5)]
[im 1/88]
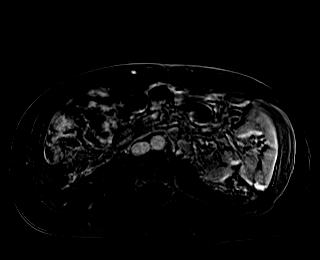
[im 44/88]
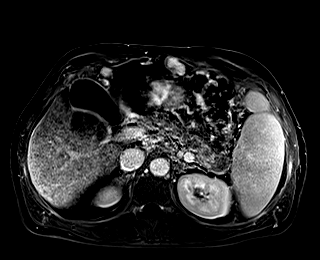
[im 88/88]
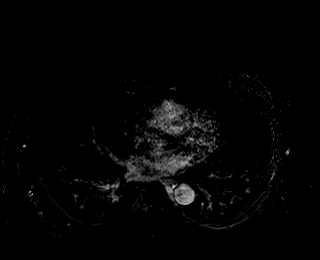

[48 of 48 positions shown; findings below may reference images not displayed]

FINDINGS: Lower chest: No acute findings.

Hepatobiliary: Liver parenchyma is inhomogeneous with nodular
contour, consistent with cirrhosis and grossly similar to previous.
Redemonstration of a lobulated exophytic macronodular component
projecting from segment 5 which is stable in size measuring 6.1 x
3.5 cm and does not demonstrate early enhancement or delayed
washout. Stable size and appearance of a few small foci of early
arterial hyperenhancement in the medial aspect of the right hepatic
lobe segment 7, measuring up to 9 mm in size without washout. Also
stable size and appearance of a 1.4 cm hypointense T2, mildly
hyperintense T1 signal lesion in the right hepatic lobe segment 6
which demonstrates relative washout on the delayed phase without
early enhancement. No new focal hepatic lesion identified.
Gallbladder appears within normal limits. No biliary ductal
dilatation.

Pancreas: No mass, inflammatory changes, or other parenchymal
abnormality identified.

Spleen:  Enlarged measuring up to 15.5 cm in length.

Adrenals/Urinary Tract: Subcentimeter cyst in the left kidney.
Kidneys appear otherwise normal. Adrenal glands are unremarkable.

Stomach/Bowel: No evidence of bowel obstruction.

Vascular/Lymphatic: Prominence of the portal veins. Prominent
recanalized umbilical vein again seen. Tortuous varicosities in the
upper abdomen including gastroesophageal varices. No bulky
lymphadenopathy visualized.

Other:  Small volume ascites, mostly perihepatic.

Musculoskeletal: No suspicious bony lesions identified.
IMPRESSION: 1. No significant change since previous study. Hepatic cirrhosis
with stable appearance of the liver including small foci of early
enhancement in the right lobe, a small focus of delayed relative
washout in the right lobe, and a large exophytic macro nodule
without enhancement protruding from segment 5. ANGLE 3.
2. Evidence of portal hypertension including splenomegaly and
varices.
3. Small volume ascites.

## 2021-08-21 MED ORDER — GADOBUTROL 1 MMOL/ML IV SOLN
7.0000 mL | Freq: Once | INTRAVENOUS | Status: AC | PRN
  Administered 2021-08-21: 7 mL via INTRAVENOUS

## 2021-08-24 NOTE — Progress Notes (Signed)
Dr Tasia Catchings   Any thoughts about this MRI, liver cirrhosis MRI shows LI-RADS 3 lesion.?biopsy  Should I be referring this patient tto follow-up ?  Bailey Mech

## 2021-09-03 ENCOUNTER — Telehealth: Payer: 59 | Admitting: Gastroenterology

## 2021-09-03 ENCOUNTER — Other Ambulatory Visit: Payer: Self-pay

## 2021-09-03 DIAGNOSIS — K7460 Unspecified cirrhosis of liver: Secondary | ICD-10-CM | POA: Diagnosis not present

## 2021-09-03 DIAGNOSIS — E8801 Alpha-1-antitrypsin deficiency: Secondary | ICD-10-CM

## 2021-09-03 DIAGNOSIS — T50905A Adverse effect of unspecified drugs, medicaments and biological substances, initial encounter: Secondary | ICD-10-CM

## 2021-09-03 NOTE — Progress Notes (Signed)
Brian Vasquez , MD [address]  [place]  [place], [place] [address]  Main: [phone]  Fax: [phone]   Primary Care Physician: Brian Bellows, MD  Virtual Visit via Video Note  I connected with patient on 09/03/21 at  1:00 PM EST by video and verified that I am speaking with the correct person using two identifiers.   I discussed the limitations, risks, security and privacy concerns of performing an evaluation and management service by video  and the availability of in person appointments. I also discussed with the patient that there may be a patient responsible charge related to this service. The patient expressed understanding and agreed to proceed.  Location of Patient: Home Location of Provider: Home Persons involved: Patient and provider only   History of Present Illness: Chief Complaint  Patient presents with   Cirrhosis    HPI: Brian Vasquez is a 46 y.o. male  Summary of history :   I initially met him in the hospital when he was admitted in October 2022 when he presented with ascites.ultrasound which showed cirrhosis with a 4.9 cm masslike lesion in the caudate lobe.  He underwent an MRI of the liver without contrast and with contrast to look at the lesion and showed no concern for malignancy simply noted focal nodular area of the liver.  Plan was to correlate with AFP which was elevated at 79.5.  Acetic fluid showed no evidence of SBP and likely was due to portal hypertension.  INR was 1.7.  Hemoglobin 13.4.  Hepatitis B core antibody was positive hepatitis C antibody was reactive hepatitis B surface antibody was negative and hepatitis C antibody was negative surface antigen was negative.  Was admitted to the hospital for hyponatremia and discharged on 05/12/2021. Likely secondary to combination of over diuresis and salt restriction. He didn't get his labs a few days after his last increase in diurertics.    05/12/2021: EGD:  3 bands placed over non bleeding  large esophageal varices. Duodenal bx shows no evidence of celiac disease.   Low alpha 1 antitrypsin levels, Elevated Ferritin and iron % saturation 88. INR 1.3 Ascitic fluid  shows SAAG suggestive of portal hypertension. AFP elevated 63.  MRI showed no clear mass but since AFP still elevated will plan further evaluation  05/29/2021 MRI liver with and without contrast shows no evidence of HCC but does another area which shows hyperenhancement and repeat MRI in 3 months has been recommended.  Features of portal hypertension seen.    Interval history 06/04/2021-09/03/2021  07/11/2021: Liver biopsy shows features of alpha-1 antitrypsin deficiency, chronic cirrhotic liver with inflammatory activity no dysplastic nodule or carcinoma seen. 08/22/2021 MRI liver with contrast shows no significant change since previous study.  LI-RADS 3 lesion noted.  Mild volume ascites.  Features of portal hypertension including splenomegaly and varices.  I discussed this with Dr. Tasia Catchings in hematology and advised close follow-up.   08/20/2021 EGD: Severe portal hypertensive gastropathy.  No esophageal varices 08/28/2021 was seen by [facility] for alpha-1 antitrypsin deficiency recommended getting vaccinations for hepatitis A and B.  We will follow-up with him.  Has a follow-up with Dr. Manuella Ghazi in March 2023.   On aldactone 100 mg once day . Furosemide 40 mg a day , NSAID's. -none . Not requiring lactulose and xifaxan was stopped by Dr Manuella Ghazi per patient - taking both daily , having 1-2 bowel movements a day .    Says weight has been stable. Swelling of breasts noticed .  Current Outpatient Medications  Medication Sig Dispense Refill   folic acid (FOLVITE) 1 MG tablet Take 1 tablet (1 mg total) by mouth daily.     furosemide (LASIX) 20 MG tablet TAKE 2 TABLETS (40 MG TOTAL) BY MOUTH DAILY. 60 tablet 1   Melatonin 10 MG TABS Take 20 mg by mouth at bedtime.     spironolactone (ALDACTONE) 100 MG tablet TAKE 1 TABLET BY MOUTH TWICE A DAY  60 tablet 1   thiamine 100 MG tablet Take 1 tablet (100 mg total) by mouth daily.     XIFAXAN 550 MG TABS tablet Take 550 mg by mouth 2 (two) times daily.     No current facility-administered medications for this visit.    Allergies as of 09/03/2021   (No Known Allergies)    Review of Systems:    All systems reviewed and negative except where noted in HPI.  General Appearance:    Alert, cooperative, no distress, appears stated age  Head:    Normocephalic, without obvious abnormality, atraumatic  Eyes:    PERRL, conjunctiva/corneas clear,  Ears:    Grossly normal hearing    Neurologic:  Grossly normal    Observations/Objective:  Labs: CMP     Component Value Date/Time   NA 137 06/19/2021 1630   K 4.7 06/19/2021 1630   CL 102 06/19/2021 1630   CO2 23 06/19/2021 1630   GLUCOSE 99 06/19/2021 1630   GLUCOSE 109 (H) 05/12/2021 0755   BUN 13 06/19/2021 1630   CREATININE 0.75 (L) 06/19/2021 1630   CALCIUM 8.7 06/19/2021 1630   PROT 6.9 06/05/2021 0913   ALBUMIN 3.6 (L) 06/05/2021 0913   AST 119 (H) 06/05/2021 0913   ALT 110 (H) 06/05/2021 0913   ALKPHOS 237 (H) 06/05/2021 0913   BILITOT 2.3 (H) 06/05/2021 0913   GFRNONAA >60 05/12/2021 0755   Lab Results  Component Value Date   WBC 9.6 07/23/2021   HGB 13.6 07/23/2021   HCT 37.2 (L) 07/23/2021   MCV 92 07/23/2021   PLT 123 (L) 07/23/2021    Imaging Studies: MR LIVER W WO CONTRAST  Result Date: 08/22/2021 CLINICAL DATA:  Cirrhosis follow-up EXAM: MRI ABDOMEN WITHOUT AND WITH CONTRAST TECHNIQUE: Multiplanar multisequence MR imaging of the abdomen was performed both before and after the administration of intravenous contrast. CONTRAST:  36mL GADAVIST GADOBUTROL 1 MMOL/ML IV SOLN COMPARISON:  MRI abdomen 05/28/2021 FINDINGS: Lower chest: No acute findings. Hepatobiliary: Liver parenchyma is inhomogeneous with nodular contour, consistent with cirrhosis and grossly similar to previous. Redemonstration of a lobulated exophytic  macronodular component projecting from segment 5 which is stable in size measuring 6.1 x 3.5 cm and does not demonstrate early enhancement or delayed washout. Stable size and appearance of a few small foci of early arterial hyperenhancement in the medial aspect of the right hepatic lobe segment 7, measuring up to 9 mm in size without washout. Also stable size and appearance of a 1.4 cm hypointense T2, mildly hyperintense T1 signal lesion in the right hepatic lobe segment 6 which demonstrates relative washout on the delayed phase without early enhancement. No new focal hepatic lesion identified. Gallbladder appears within normal limits. No biliary ductal dilatation. Pancreas: No mass, inflammatory changes, or other parenchymal abnormality identified. Spleen:  Enlarged measuring up to 15.5 cm in length. Adrenals/Urinary Tract: Subcentimeter cyst in the left kidney. Kidneys appear otherwise normal. Adrenal glands are unremarkable. Stomach/Bowel: No evidence of bowel obstruction. Vascular/Lymphatic: Prominence of the portal veins. Prominent recanalized umbilical vein again  seen. Tortuous varicosities in the upper abdomen including gastroesophageal varices. No bulky lymphadenopathy visualized. Other:  Small volume ascites, mostly perihepatic. Musculoskeletal: No suspicious bony lesions identified. IMPRESSION: 1. No significant change since previous study. Hepatic cirrhosis with stable appearance of the liver including small foci of early enhancement in the right lobe, a small focus of delayed relative washout in the right lobe, and a large exophytic macro nodule without enhancement protruding from segment 5. LI-RADS 3. 2. Evidence of portal hypertension including splenomegaly and varices. 3. Small volume ascites. Electronically Signed   By: Ofilia Neas M.D.   On: 08/22/2021 10:09    Assessment and Plan:   Verlyn Lambert is a 46 y.o. 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  Concern for masslike lesion on ultrasound but no mass seen on MRI but AFP has been elevated.  Repeat MRI requested with and without gadolinium.  Immune to hepatitis A but not hepatitis B, has commenced vaccination.  Up-to-date with pneumococcal, influenza shot.   Did not tolerate 200 mg of Aldactone and 40 mg of Lasix as he developed severe hyponatremia.  Positive tissue transglutaminase antibody but negative biopsies of the duodenum for celiac disease.  Tests for autoimmune liver disease was negative.  Elevated ferritin, very low alpha-1 antitrypsin levels, persistently elevated AFP   Plan 1.  No NSAIDs 2.  Check daily weights and weight is going up to give our office a call 3.  Check CMP to ensure stable. , stop aldactone due to possible gynecomastia . He will watch his weight and if it rises would start him on Eplerenone. . 4.  Low-sodium diet less than 2000 mg/day 5.  Elevated AFP: .  Repeat MRI in August  2023 6.  EGD for surveillance of esophageal varices August 2023 7.  Continue to stay off all alcohol 8.  Complete hepatitis B vaccination 9.  Follow-up with [facility] hepatology  10.  Hepatic encephalopathy commence on lactulose titrated to soft bowel movement per day commence on Xifaxan  F/u in 8-12 weeks   I have discussed alternative options, risks & benefits,  which include, but are not limited to, bleeding, infection, perforation,respiratory complication & drug reaction.  The patient agrees with this plan & written consent will be obtained.      I discussed the assessment and treatment plan with the patient. The patient was provided an opportunity to ask questions and all were answered. The patient agreed with the plan and demonstrated an understanding of the instructions.   The patient was advised to  call back or seek an in-person evaluation if the symptoms worsen or if the condition fails to improve as anticipated.  I provided 27minutes of face-to-face time during this encounter.  Dr Brian Bellows MD,MRCP [facility]) Gastroenterology/Hepatology Pager: [phone]   Speech recognition software was used to dictate this note.

## 2021-09-05 ENCOUNTER — Other Ambulatory Visit: Payer: Self-pay

## 2021-09-05 DIAGNOSIS — K7460 Unspecified cirrhosis of liver: Secondary | ICD-10-CM

## 2021-09-08 ENCOUNTER — Encounter: Payer: Self-pay | Admitting: Gastroenterology

## 2021-09-09 LAB — CBC WITH DIFFERENTIAL/PLATELET

## 2021-09-10 ENCOUNTER — Other Ambulatory Visit: Payer: Self-pay

## 2021-09-10 ENCOUNTER — Other Ambulatory Visit: Payer: Self-pay | Admitting: Gastroenterology

## 2021-09-10 LAB — CBC WITH DIFFERENTIAL/PLATELET
Basophils Absolute: 0.1 10*3/uL (ref 0.0–0.2)
Basos: 1 %
EOS (ABSOLUTE): 0.2 10*3/uL (ref 0.0–0.4)
Eos: 3 %
Hematocrit: 38.3 % (ref 37.5–51.0)
Hemoglobin: 13.5 g/dL (ref 13.0–17.7)
Immature Grans (Abs): 0 10*3/uL (ref 0.0–0.1)
Immature Granulocytes: 0 %
Lymphocytes Absolute: 2.1 10*3/uL (ref 0.7–3.1)
Lymphs: 30 %
MCH: 32.5 pg (ref 26.6–33.0)
MCHC: 35.2 g/dL (ref 31.5–35.7)
MCV: 92 fL (ref 79–97)
Monocytes Absolute: 0.7 10*3/uL (ref 0.1–0.9)
Monocytes: 10 %
Neutrophils Absolute: 3.8 10*3/uL (ref 1.4–7.0)
Neutrophils: 56 %
Platelets: 102 10*3/uL — ABNORMAL LOW (ref 150–450)
RBC: 4.16 x10E6/uL (ref 4.14–5.80)
RDW: 13.6 % (ref 11.6–15.4)
WBC: 6.8 10*3/uL (ref 3.4–10.8)

## 2021-09-10 LAB — COMPREHENSIVE METABOLIC PANEL
ALT: 66 IU/L — ABNORMAL HIGH (ref 0–44)
AST: 77 IU/L — ABNORMAL HIGH (ref 0–40)
Albumin/Globulin Ratio: 1.4 (ref 1.2–2.2)
Albumin: 3.7 g/dL — ABNORMAL LOW (ref 4.0–5.0)
Alkaline Phosphatase: 153 IU/L — ABNORMAL HIGH (ref 44–121)
BUN/Creatinine Ratio: 14 (ref 9–20)
BUN: 12 mg/dL (ref 6–24)
Bilirubin Total: 1.2 mg/dL (ref 0.0–1.2)
CO2: 22 mmol/L (ref 20–29)
Calcium: 8.7 mg/dL (ref 8.7–10.2)
Chloride: 100 mmol/L (ref 96–106)
Creatinine, Ser: 0.83 mg/dL (ref 0.76–1.27)
Globulin, Total: 2.6 g/dL (ref 1.5–4.5)
Glucose: 109 mg/dL — ABNORMAL HIGH (ref 70–99)
Potassium: 4.3 mmol/L (ref 3.5–5.2)
Sodium: 134 mmol/L (ref 134–144)
Total Protein: 6.3 g/dL (ref 6.0–8.5)
eGFR: 109 mL/min/{1.73_m2} (ref >59)

## 2021-09-10 LAB — PROTIME-INR
INR: 1.2 (ref 0.9–1.2)
Prothrombin Time: 12.8 s — ABNORMAL HIGH (ref 9.1–12.0)

## 2021-09-10 MED ORDER — EPLERENONE 25 MG PO TABS: 25.0000 mg | ORAL_TABLET | Freq: Every day | ORAL | 3 refills | Status: DC

## 2021-09-10 NOTE — Telephone Encounter (Signed)
Inform  ? ?1. Do not restart aldactone as it will cause breast pain. Start on Eplerenone 25 mg once daily and repeat BMP in 5 days time .  ? ?2. Monitor weight and once weight stable can go back to 2 grams sodium per day .  ? ?Labs otherwise look stable.

## 2021-09-23 ENCOUNTER — Other Ambulatory Visit: Payer: Self-pay | Admitting: Gastroenterology

## 2021-09-26 ENCOUNTER — Other Ambulatory Visit: Payer: Self-pay

## 2021-09-26 DIAGNOSIS — R772 Abnormality of alphafetoprotein: Secondary | ICD-10-CM

## 2021-10-04 ENCOUNTER — Other Ambulatory Visit: Payer: Self-pay | Admitting: Gastroenterology

## 2021-10-10 LAB — BASIC METABOLIC PANEL
BUN/Creatinine Ratio: 12 (ref 9–20)
BUN: 9 mg/dL (ref 6–24)
CO2: 23 mmol/L (ref 20–29)
Calcium: 8.9 mg/dL (ref 8.7–10.2)
Chloride: 103 mmol/L (ref 96–106)
Creatinine, Ser: 0.73 mg/dL — ABNORMAL LOW (ref 0.76–1.27)
Glucose: 123 mg/dL — ABNORMAL HIGH (ref 70–99)
Potassium: 4 mmol/L (ref 3.5–5.2)
Sodium: 141 mmol/L (ref 134–144)
eGFR: 114 mL/min/{1.73_m2} (ref >59)

## 2021-11-23 ENCOUNTER — Encounter: Payer: Self-pay | Admitting: Gastroenterology

## 2021-11-26 ENCOUNTER — Other Ambulatory Visit: Payer: Self-pay

## 2021-11-26 MED ORDER — FUROSEMIDE 20 MG PO TABS: 40.0000 mg | ORAL_TABLET | Freq: Every day | ORAL | 2 refills | Status: DC

## 2021-12-08 ENCOUNTER — Telehealth: Payer: 59 | Admitting: Gastroenterology

## 2021-12-23 ENCOUNTER — Telehealth: Payer: 59 | Admitting: Gastroenterology

## 2021-12-23 ENCOUNTER — Other Ambulatory Visit: Payer: Self-pay

## 2021-12-23 DIAGNOSIS — K7460 Unspecified cirrhosis of liver: Secondary | ICD-10-CM | POA: Diagnosis not present

## 2021-12-23 DIAGNOSIS — E8801 Alpha-1-antitrypsin deficiency: Secondary | ICD-10-CM | POA: Diagnosis not present

## 2021-12-23 DIAGNOSIS — I8510 Secondary esophageal varices without bleeding: Secondary | ICD-10-CM

## 2021-12-23 DIAGNOSIS — R772 Abnormality of alphafetoprotein: Secondary | ICD-10-CM

## 2021-12-23 NOTE — Progress Notes (Signed)
Brian Vasquez , MD [address]  [place]  [place], [place] [address]  Main: [phone]  Fax: [phone]   Primary Care Physician: Brian Bellows, MD  Virtual Visit via Video Note  I connected with patient on 12/23/21 at  3:00 PM EDT by video and verified that I am speaking with the correct person using two identifiers.   I discussed the limitations, risks, security and privacy concerns of performing an evaluation and management service by video  and the availability of in person appointments. I also discussed with the patient that there may be a patient responsible charge related to this service. The patient expressed understanding and agreed to proceed.  Location of Patient: Home Location of Provider: Home Persons involved: Patient and provider only   History of Present Illness: Chief Complaint  Patient presents with   Cirrhosis    HPI: Brian Vasquez is a 46 y.o. male  ummary of history :   I initially met him in the hospital when he was admitted in October 2022 when he presented with ascites.ultrasound which showed cirrhosis with a 4.9 cm masslike lesion in the caudate lobe.  He underwent an MRI of the liver without contrast and with contrast to look at the lesion and showed no concern for malignancy simply noted focal nodular area of the liver.  Plan was to correlate with AFP which was elevated at 79.5.  Acetic fluid showed no evidence of SBP and likely was due to portal hypertension.  INR was 1.7.  Hemoglobin 13.4.  Hepatitis B core antibody was positive hepatitis C antibody was reactive hepatitis B surface antibody was negative and hepatitis C antibody was negative surface antigen was negative.   Was admitted to the hospital for hyponatremia and discharged on 05/12/2021. Likely secondary to combination of over diuresis and salt restriction. He didn't get his labs a few days after his last increase in diurertics.    05/12/2021: EGD:  3 bands placed over non  bleeding large esophageal varices. Duodenal bx shows no evidence of celiac disease.   Low alpha 1 antitrypsin levels, Elevated Ferritin and iron % saturation 88. INR 1.3 Ascitic fluid  shows SAAG suggestive of portal hypertension. AFP elevated 63.  MRI showed no clear mass but since AFP still elevated will plan further evaluation   05/29/2021 MRI liver with and without contrast shows no evidence of HCC but does another area which shows hyperenhancement and repeat MRI in 3 months has been recommended.  Features of portal hypertension seen.  07/11/2021: Liver biopsy shows features of alpha-1 antitrypsin deficiency, chronic cirrhotic liver with inflammatory activity no dysplastic nodule or carcinoma seen. 08/22/2021 MRI liver with contrast shows no significant change since previous study.  LI-RADS 3 lesion noted.  Mild volume ascites.  Features of portal hypertension including splenomegaly and varices.   08/20/2021 EGD: Severe portal hypertensive gastropathy.  No esophageal varices 08/28/2021 was seen by [facility] for alpha-1 antitrypsin deficiency recommended getting vaccinations for hepatitis A and B.    Interval history 13/07/2021-12/23/2021  09/23/2021: Followed up with Dr. Manuella Ghazi at [facility] had an MRI showing cirrhosis with portal hypertension 1.2 cm hepatic segment nodule compatible with benign biopsy no focal hepatic lesions Had his eplerenone around I00 mg a day, Lasix 40 mg a day likely will need a liver transplant  NSAID's. -none . Not requiring lactulose and xifaxan was stopped by Dr Manuella Ghazi per patient - taking both daily , having 1-2 bowel movements a day .  Says weight has been stable.       Current Outpatient Medications  Medication Sig Dispense Refill   eplerenone (INSPRA) 50 MG tablet Take 1 tablet by mouth daily.     folic acid (FOLVITE) 1 MG tablet Take 1 tablet by mouth daily.     furosemide (LASIX) 20 MG tablet Take 2 tablets (40 mg total) by mouth daily. 60 tablet 2   Melatonin 10 MG TABS  Take 20 mg by mouth at bedtime.     thiamine 100 MG tablet Take 1 tablet (100 mg total) by mouth daily.     No current facility-administered medications for this visit.    Allergies as of 12/23/2021   (No Known Allergies)    Review of Systems:    All systems reviewed and negative except where noted in HPI.  General Appearance:    Alert, cooperative, no distress, appears stated age  Head:    Normocephalic, without obvious abnormality, atraumatic  Eyes:    PERRL, conjunctiva/corneas clear,  Ears:    Grossly normal hearing    Neurologic:  Grossly normal    Observations/Objective:  Labs: CMP     Component Value Date/Time   NA 141 10/09/2021 1654   K 4.0 10/09/2021 1654   CL 103 10/09/2021 1654   CO2 23 10/09/2021 1654   GLUCOSE 123 (H) 10/09/2021 1654   GLUCOSE 109 (H) 05/12/2021 0755   BUN 9 10/09/2021 1654   CREATININE 0.73 (L) 10/09/2021 1654   CALCIUM 8.9 10/09/2021 1654   PROT 6.3 09/08/2021 1450   ALBUMIN 3.7 (L) 09/08/2021 1450   AST 77 (H) 09/08/2021 1450   ALT 66 (H) 09/08/2021 1450   ALKPHOS 153 (H) 09/08/2021 1450   BILITOT 1.2 09/08/2021 1450   GFRNONAA >60 05/12/2021 0755   Lab Results  Component Value Date   WBC 6.8 09/08/2021   HGB 13.5 09/08/2021   HCT 38.3 09/08/2021   MCV 92 09/08/2021   PLT 102 (L) 09/08/2021    Imaging Studies: No results found.  Assessment and Plan:   Brian Vasquez is a 46 y.o. y/o malehas a history of alcoholic liver and alpha 1 antitrypsin deficiency related cirrhosis, decompensation with ascites presented to the ER in October 2022 with abdominal distention and acute liver failure, non bleeding esophageal varices that have been banded.  Concern for masslike lesion on ultrasound but no mass seen on MRI but AFP has been elevated, follow-up with Dr. Manuella Ghazi at [facility] biopsy was benign. Did not tolerate 100 mg of Aldactone and 40 mg of Lasix as he developed gynecomastia, on eplerenone 100 mg and Lasix 40 mg positive tissue  transglutaminase antibody but negative biopsies of the duodenum for celiac disease.  Tests for autoimmune liver disease was negative.  Elevated ferritin, very low alpha-1 antitrypsin levels, persistently elevated AFP, D/c xifaxan recently has he has not had encepheloptahy   Plan 1.  No NSAIDs 2.  On eplerenone 100 mg a day and Lasix 40 mg a day to continue 4.  Low-sodium diet very compliant 5.  Elevated AFP: .  Repeat MRI in August  2023 6.  EGD for surveillance of esophageal varices August 2023 7.  Continue to stay off all alcohol 8.  Complete hepatitis B vaccination 9.  Follow-up with [facility] hepatology Dr. Manuella Ghazi as scheduled 10.  Eventual plan for liver transplant at this time MELD score is not too high   I have discussed alternative options, risks & benefits,  which include, but are not limited to,  bleeding, infection, perforation,respiratory complication & drug reaction.  The patient agrees with this plan & written consent will be obtained.     Follow-up in 4 months    I discussed the assessment and treatment plan with the patient. The patient was provided an opportunity to ask questions and all were answered. The patient agreed with the plan and demonstrated an understanding of the instructions.   The patient was advised to call back or seek an in-person evaluation if the symptoms worsen or if the condition fails to improve as anticipated.  I provided 20 minutes of face-to-face time during this encounter.  Dr Brian Bellows MD,MRCP [facility]) Gastroenterology/Hepatology Pager: [phone]   Speech recognition software was used to dictate this note.

## 2021-12-30 ENCOUNTER — Encounter: Payer: Self-pay | Admitting: Gastroenterology

## 2022-01-10 LAB — HEPATITIS B SURFACE ANTIBODY,QUALITATIVE: Hep B Surface Ab, Qual: NONREACTIVE

## 2022-02-20 ENCOUNTER — Encounter: Payer: Self-pay | Admitting: Gastroenterology

## 2022-02-20 ENCOUNTER — Ambulatory Visit
Admission: RE | Admit: 2022-02-20 | Discharge: 2022-02-20 | Disposition: A | Discharge status: Home / Self Care | Payer: 59 | Source: Ambulatory Visit | Attending: Gastroenterology | Admitting: Gastroenterology

## 2022-02-20 DIAGNOSIS — K7460 Unspecified cirrhosis of liver: Secondary | ICD-10-CM | POA: Insufficient documentation

## 2022-02-20 MED ORDER — GADOBUTROL 1 MMOL/ML IV SOLN
7.5000 mL | Freq: Once | INTRAVENOUS | Status: AC | PRN
  Administered 2022-02-20: 7.5 mL via INTRAVENOUS

## 2022-02-21 ENCOUNTER — Other Ambulatory Visit: Payer: Self-pay | Admitting: Gastroenterology

## 2022-02-23 ENCOUNTER — Encounter
Admission: RE | Disposition: A | Discharge status: Home / Self Care | Payer: Self-pay | Source: Home / Self Care | Attending: Gastroenterology

## 2022-02-23 ENCOUNTER — Ambulatory Visit
Admission: RE | Admit: 2022-02-23 | Discharge: 2022-02-23 | Disposition: A | Discharge status: Home / Self Care | Payer: 59 | Attending: Gastroenterology | Admitting: Gastroenterology

## 2022-02-23 ENCOUNTER — Ambulatory Visit: Payer: 59 | Admitting: Registered Nurse

## 2022-02-23 ENCOUNTER — Other Ambulatory Visit: Payer: Self-pay

## 2022-02-23 DIAGNOSIS — I8500 Esophageal varices without bleeding: Secondary | ICD-10-CM | POA: Diagnosis not present

## 2022-02-23 DIAGNOSIS — Z87891 Personal history of nicotine dependence: Secondary | ICD-10-CM | POA: Insufficient documentation

## 2022-02-23 DIAGNOSIS — I10 Essential (primary) hypertension: Secondary | ICD-10-CM | POA: Insufficient documentation

## 2022-02-23 DIAGNOSIS — K766 Portal hypertension: Secondary | ICD-10-CM | POA: Insufficient documentation

## 2022-02-23 DIAGNOSIS — K3189 Other diseases of stomach and duodenum: Secondary | ICD-10-CM

## 2022-02-23 DIAGNOSIS — Z09 Encounter for follow-up examination after completed treatment for conditions other than malignant neoplasm: Secondary | ICD-10-CM | POA: Insufficient documentation

## 2022-02-23 DIAGNOSIS — K7031 Alcoholic cirrhosis of liver with ascites: Secondary | ICD-10-CM | POA: Diagnosis not present

## 2022-02-23 DIAGNOSIS — I8510 Secondary esophageal varices without bleeding: Secondary | ICD-10-CM

## 2022-02-23 DIAGNOSIS — K7460 Unspecified cirrhosis of liver: Secondary | ICD-10-CM

## 2022-02-23 HISTORY — PX: ESOPHAGOGASTRODUODENOSCOPY (EGD) WITH PROPOFOL: SHX5813

## 2022-02-23 SURGERY — ESOPHAGOGASTRODUODENOSCOPY (EGD) WITH PROPOFOL
Anesthesia: General

## 2022-02-23 MED ORDER — SODIUM CHLORIDE 0.9 % IV SOLN: INTRAVENOUS | Status: DC

## 2022-02-23 MED ORDER — EPHEDRINE 5 MG/ML INJ
INTRAVENOUS | Status: AC
  Filled 2022-02-23: qty 5

## 2022-02-23 MED ORDER — PROPOFOL 1000 MG/100ML IV EMUL
INTRAVENOUS | Status: AC
  Filled 2022-02-23: qty 200

## 2022-02-23 MED ORDER — PROPOFOL 500 MG/50ML IV EMUL
INTRAVENOUS | Status: DC | PRN
  Administered 2022-02-23: 180 ug/kg/min via INTRAVENOUS

## 2022-02-23 MED ORDER — PROPOFOL 10 MG/ML IV BOLUS
INTRAVENOUS | Status: DC | PRN
  Administered 2022-02-23: 80 mg via INTRAVENOUS
  Administered 2022-02-23: 20 mg via INTRAVENOUS

## 2022-02-23 MED ORDER — DEXMEDETOMIDINE HCL 200 MCG/2ML IV SOLN
INTRAVENOUS | Status: DC | PRN
  Administered 2022-02-23: 10 ug via INTRAVENOUS

## 2022-02-23 MED ORDER — ONDANSETRON HCL 4 MG/2ML IJ SOLN
INTRAMUSCULAR | Status: AC
  Filled 2022-02-23: qty 2

## 2022-02-23 MED ORDER — FUROSEMIDE 20 MG PO TABS: 20.0000 mg | ORAL_TABLET | Freq: Every day | ORAL | 2 refills | Status: DC

## 2022-02-23 MED ORDER — LIDOCAINE HCL (CARDIAC) PF 100 MG/5ML IV SOSY
PREFILLED_SYRINGE | INTRAVENOUS | Status: DC | PRN
  Administered 2022-02-23: 100 mg via INTRAVENOUS

## 2022-02-23 NOTE — Anesthesia Preprocedure Evaluation (Signed)
Anesthesia Evaluation  Patient identified by MRN, date of birth, ID band Patient awake    Reviewed: Allergy & Precautions, NPO status , Patient's Chart, lab work & pertinent test results  History of Anesthesia Complications Negative for: history of anesthetic complications  Airway Mallampati: III  TM Distance: >3 FB Neck ROM: full    Dental  (+) Chipped, Poor Dentition, Missing, Dental Advidsory Given   Pulmonary neg pulmonary ROS, neg shortness of breath, neg recent URI, former smoker,    Pulmonary exam normal        Cardiovascular Exercise Tolerance: Good hypertension, (-) angina(-) Past MI and (-) DOE Normal cardiovascular exam(-) dysrhythmias      Neuro/Psych negative neurological ROS  negative psych ROS   GI/Hepatic negative GI ROS, neg GERD  ,(+) Cirrhosis   Esophageal Varices  substance abuse  alcohol use,   Endo/Other  negative endocrine ROS  Renal/GU negative Renal ROS  negative genitourinary   Musculoskeletal   Abdominal   Peds  Hematology negative hematology ROS (+)   Anesthesia Other Findings Past Medical History: No date: Alcoholic cirrhosis of liver with ascites ([place]) No date: Cirrhosis ([place]) No date: Esophageal varices ([facility]) No date: ETOH abuse No date: Hypertension 05/10/2021: Hyponatremia  Past Surgical History: 05/12/2021: ESOPHAGOGASTRODUODENOSCOPY (EGD) WITH PROPOFOL; N/A     Comment:  Procedure: ESOPHAGOGASTRODUODENOSCOPY (EGD) WITH               PROPOFOL;  Surgeon: [name], MD;  Location: [facility]               ENDOSCOPY;  Service: Gastroenterology;  Laterality: N/A;  BMI    Body Mass Index: 22.96 kg/m      Reproductive/Obstetrics negative OB ROS                             Anesthesia Physical  Anesthesia Plan  ASA: 3  Anesthesia Plan: General   Post-op Pain Management:    Induction: Intravenous  PONV Risk Score and Plan: Propofol infusion and  TIVA  Airway Management Planned: Natural Airway and Nasal Cannula  Additional Equipment:   Intra-op Plan:   Post-operative Plan:   Informed Consent: I have reviewed the patients History and Physical, chart, labs and discussed the procedure including the risks, benefits and alternatives for the proposed anesthesia with the patient or authorized representative who has indicated his/her understanding and acceptance.     Dental Advisory Given  Plan Discussed with: Anesthesiologist, CRNA and Surgeon  Anesthesia Plan Comments: (Patient consented for risks of anesthesia including but not limited to:  - adverse reactions to medications - risk of airway placement if required - damage to eyes, teeth, lips or other oral mucosa - nerve damage due to positioning  - sore throat or hoarseness - Damage to heart, brain, nerves, lungs, other parts of body or loss of life  Patient voiced understanding.)        Anesthesia Quick Evaluation

## 2022-02-23 NOTE — Progress Notes (Signed)
Inform Mri shows everything is similar to last imaging- repeat MRI in 6 months and send a copy of these results to PCP and Dr Manuella Ghazi hepatology at [facility] where he has been seen

## 2022-02-23 NOTE — H&P (Signed)
Brian Bellows, MD [address], [place], [place], [place], [address] [address], [place], [place], [place], [address] Phone: [phone]  Fax: [phone]  Primary Care Physician:  Brian Bellows, MD   Pre-Procedure History & Physical: HPI:  Brian Vasquez is a 46 y.o. male is here for an endoscopy    Past Medical History:  Diagnosis Date   Alcoholic cirrhosis of liver with ascites ([place])    Cirrhosis ([place])    Esophageal varices ([place])    ETOH abuse    Hypertension    Hyponatremia 05/10/2021    Past Surgical History:  Procedure Laterality Date   ESOPHAGOGASTRODUODENOSCOPY (EGD) WITH PROPOFOL N/A 05/12/2021   Procedure: ESOPHAGOGASTRODUODENOSCOPY (EGD) WITH PROPOFOL;  Surgeon: Brian Bellows, MD;  Location: [facility] ENDOSCOPY;  Service: Gastroenterology;  Laterality: N/A;   ESOPHAGOGASTRODUODENOSCOPY (EGD) WITH PROPOFOL N/A 07/08/2021   Procedure: ESOPHAGOGASTRODUODENOSCOPY (EGD) WITH PROPOFOL;  Surgeon: Brian Bellows, MD;  Location: [facility] ENDOSCOPY;  Service: Gastroenterology;  Laterality: N/A;   ESOPHAGOGASTRODUODENOSCOPY (EGD) WITH PROPOFOL N/A 08/20/2021   Procedure: ESOPHAGOGASTRODUODENOSCOPY (EGD) WITH PROPOFOL;  Surgeon: Brian Bellows, MD;  Location: [facility] ENDOSCOPY;  Service: Gastroenterology;  Laterality: N/A;    Prior to Admission medications   Medication Sig Start Date End Date Taking? Authorizing Provider  eplerenone (INSPRA) 50 MG tablet Take 1 tablet by mouth daily. 12/10/21   [provider]  folic acid (FOLVITE) 1 MG tablet Take 1 tablet by mouth daily. 04/16/21   [provider]  furosemide (LASIX) 20 MG tablet Take 2 tablets (40 mg total) by mouth daily. 11/26/21   Brian Bellows, MD  Melatonin 10 MG TABS Take 20 mg by mouth at bedtime.    [provider]  thiamine 100 MG tablet Take 1 tablet (100 mg total) by mouth daily. 04/16/21   Cherene Altes, MD    Allergies as of 12/24/2021   (No Known Allergies)    History reviewed. No pertinent  family history.  Social History   Socioeconomic History   Marital status: Divorced    Spouse name: Not on file   Number of children: Not on file   Years of education: Not on file   Highest education level: Not on file  Occupational History   Not on file  Tobacco Use   Smoking status: Former    Types: Cigarettes   Smokeless tobacco: Never  Vaping Use   Vaping Use: Never used  Substance and Sexual Activity   Alcohol use: Not Currently   Drug use: Not Currently   Sexual activity: Not on file  Other Topics Concern   Not on file  Social History Narrative   Not on file   Social Determinants of Health   Financial Resource Strain: Not on file  Food Insecurity: Not on file  Transportation Needs: Not on file  Physical Activity: Not on file  Stress: Not on file  Social Connections: Not on file  Intimate Partner Violence: Not on file    Review of Systems: See HPI, otherwise negative ROS  Physical Exam: There were no vitals taken for this visit. General:   Alert,  pleasant and cooperative in NAD Head:  Normocephalic and atraumatic. Neck:  Supple; no masses or thyromegaly. Lungs:  Clear throughout to auscultation, normal respiratory effort.    Heart:  +S1, +S2, Regular rate and rhythm, No edema. Abdomen:  Soft, nontender and nondistended. Normal bowel sounds, without guarding, and without rebound.   Neurologic:  Alert and  oriented x4;  grossly normal neurologically.  Impression/Plan: Brian Vasquez is here for an endoscopy  to be performed for  evaluation of esophageal varices    Risks, benefits, limitations, and alternatives regarding endoscopy have been reviewed with the patient.  Questions have been answered.  All parties agreeable.   Brian Bellows, MD  02/23/2022, 8:58 AM

## 2022-02-23 NOTE — Anesthesia Postprocedure Evaluation (Signed)
Anesthesia Post Note  Patient: Brian Vasquez  Procedure(s) Performed: ESOPHAGOGASTRODUODENOSCOPY (EGD) WITH PROPOFOL  Patient location during evaluation: Endoscopy Anesthesia Type: General Level of consciousness: awake and alert Pain management: pain level controlled Vital Signs Assessment: post-procedure vital signs reviewed and stable Respiratory status: spontaneous breathing, nonlabored ventilation, respiratory function stable and patient connected to nasal cannula oxygen Cardiovascular status: blood pressure returned to baseline and stable Postop Assessment: no apparent nausea or vomiting Anesthetic complications: no   No notable events documented.   Last Vitals:  Vitals:   02/23/22 0901 02/23/22 0926  BP: (!) 155/111 118/87  Pulse: 81   Resp: 18   Temp: 36.8 C 36.9 C  SpO2: 99% 94%    Last Pain:  Vitals:   02/23/22 0926  TempSrc: Temporal  PainSc: Asleep                 Arita Miss

## 2022-02-23 NOTE — Transfer of Care (Signed)
Immediate Anesthesia Transfer of Care Note  Patient: Brian Vasquez  Procedure(s) Performed: ESOPHAGOGASTRODUODENOSCOPY (EGD) WITH PROPOFOL  Patient Location: Endoscopy Unit  Anesthesia Type:General  Level of Consciousness: drowsy  Airway & Oxygen Therapy: Patient Spontanous Breathing  Post-op Assessment: Report given to RN and Post -op Vital signs reviewed and stable  Post vital signs: Reviewed and stable  Last Vitals:  Vitals Value Taken Time  BP    Temp    Pulse 85 02/23/22 0926  Resp 19 02/23/22 0926  SpO2 92 % 02/23/22 0926  Vitals shown include unvalidated device data.  Last Pain:  Vitals:   02/23/22 0901  TempSrc: Temporal         Complications: No notable events documented.

## 2022-02-23 NOTE — Op Note (Signed)
[facility] Gastroenterology Patient Name: Brian Vasquez Procedure Date: 02/23/2022 9:02 AM MRN: [id number] Account #: [id number] Date of Birth: October 17, 1975 Admit Type: Outpatient Age: 46 Room: [facility] ENDO ROOM 1 Gender: Male Note Status: Finalized Instrument Name: Upper Endoscope [id number] Procedure:             Upper GI endoscopy Indications:           Follow-up of esophageal varices Providers:             [name] MD, MD Referring MD:          [name] MD, MD (Referring MD) Medicines:             Monitored Anesthesia Care Complications:         No immediate complications. Procedure:             Pre-Anesthesia Assessment:                        - Prior to the procedure, a History and Physical was                         performed, and patient medications, allergies and                         sensitivities were reviewed. The patient's tolerance                         of previous anesthesia was reviewed.                        - The risks and benefits of the procedure and the                         sedation options and risks were discussed with the                         patient. All questions were answered and informed                         consent was obtained.                        - ASA Grade Assessment: III - A patient with severe                         systemic disease.                        After obtaining informed consent, the endoscope was                         passed under direct vision. Throughout the procedure,                         the patient's blood pressure, pulse, and oxygen                         saturations were monitored continuously. The Endoscope  was introduced through the mouth, and advanced to the                         third part of duodenum. The upper GI endoscopy was                         accomplished with ease. The patient tolerated the                         procedure well. Findings:       The examined duodenum was normal.      Moderate portal hypertensive gastropathy was found in the entire       examined stomach.      The examined esophagus was normal.      The cardia and gastric fundus were normal on retroflexion. Impression:            - Normal examined duodenum.                        - Portal hypertensive gastropathy.                        - Normal esophagus.                        - No specimens collected. Recommendation:        - Discharge patient to home (with escort).                        - Resume previous diet.                        - Continue present medications.                        - Repeat upper endoscopy in 1 year for surveillance. Procedure Code(s):     --- Professional ---                        [phone], Esophagogastroduodenoscopy, flexible,                         transoral; diagnostic, including collection of                         specimen(s) by brushing or washing, when performed                         (separate procedure) Diagnosis Code(s):     --- Professional ---                        K76.6, Portal hypertension                        K31.89, Other diseases of stomach and duodenum                        I85.00, Esophageal varices without bleeding CPT copyright 2019 American Medical Association. All rights reserved. The codes documented in this report are preliminary and upon coder review may  be revised to meet current compliance requirements. [name],  MD [name] MD, MD 02/23/2022 9:24:02 AM This report has been signed electronically. Number of Addenda: 0 Note Initiated On: 02/23/2022 9:02 AM Estimated Blood Loss:  Estimated blood loss: none.      [facility]

## 2022-02-24 ENCOUNTER — Encounter: Payer: Self-pay | Admitting: Gastroenterology

## 2022-02-24 ENCOUNTER — Telehealth: Payer: Self-pay

## 2022-02-24 NOTE — Telephone Encounter (Signed)
-----   Message from [name], MD sent at 02/23/2022  8:10 AM EDT ----- Inform Mri shows everything is similar to last imaging- repeat MRI in 6 months and send a copy of these results to PCP and Dr Manuella Ghazi hepatology at [facility] where he has been seen

## 2022-02-24 NOTE — Telephone Encounter (Signed)
Sent mychart and put in a recall for 6 months MRI

## 2022-02-28 LAB — CBC WITH DIFFERENTIAL/PLATELET
Basophils Absolute: 0 10*3/uL (ref 0.0–0.2)
Basos: 1 %
EOS (ABSOLUTE): 0.2 10*3/uL (ref 0.0–0.4)
Eos: 2 %
Hematocrit: 45 % (ref 37.5–51.0)
Hemoglobin: 16.1 g/dL (ref 13.0–17.7)
Immature Grans (Abs): 0 10*3/uL (ref 0.0–0.1)
Immature Granulocytes: 0 %
Lymphocytes Absolute: 2.6 10*3/uL (ref 0.7–3.1)
Lymphs: 33 %
MCH: 32.1 pg (ref 26.6–33.0)
MCHC: 35.8 g/dL — ABNORMAL HIGH (ref 31.5–35.7)
MCV: 90 fL (ref 79–97)
Monocytes Absolute: 0.6 10*3/uL (ref 0.1–0.9)
Monocytes: 8 %
Neutrophils Absolute: 4.5 10*3/uL (ref 1.4–7.0)
Neutrophils: 56 %
Platelets: 123 10*3/uL — ABNORMAL LOW (ref 150–450)
RBC: 5.02 x10E6/uL (ref 4.14–5.80)
RDW: 14 % (ref 11.6–15.4)
WBC: 8 10*3/uL (ref 3.4–10.8)

## 2022-02-28 LAB — COMPREHENSIVE METABOLIC PANEL
ALT: 49 IU/L — ABNORMAL HIGH (ref 0–44)
AST: 49 IU/L — ABNORMAL HIGH (ref 0–40)
Albumin/Globulin Ratio: 1.3 (ref 1.2–2.2)
Albumin: 3.8 g/dL — ABNORMAL LOW (ref 4.1–5.1)
Alkaline Phosphatase: 155 IU/L — ABNORMAL HIGH (ref 44–121)
BUN/Creatinine Ratio: 12 (ref 9–20)
BUN: 12 mg/dL (ref 6–24)
Bilirubin Total: 1.3 mg/dL — ABNORMAL HIGH (ref 0.0–1.2)
CO2: 25 mmol/L (ref 20–29)
Calcium: 9.1 mg/dL (ref 8.7–10.2)
Chloride: 98 mmol/L (ref 96–106)
Creatinine, Ser: 0.98 mg/dL (ref 0.76–1.27)
Globulin, Total: 2.9 g/dL (ref 1.5–4.5)
Glucose: 129 mg/dL — ABNORMAL HIGH (ref 70–99)
Potassium: 3.8 mmol/L (ref 3.5–5.2)
Sodium: 136 mmol/L (ref 134–144)
Total Protein: 6.7 g/dL (ref 6.0–8.5)
eGFR: 96 mL/min/{1.73_m2} (ref >59)

## 2022-02-28 LAB — PROTIME-INR
INR: 1.2 (ref 0.9–1.2)
Prothrombin Time: 13.1 s — ABNORMAL HIGH (ref 9.1–12.0)

## 2022-03-02 NOTE — Progress Notes (Signed)
Not immune to hep b needs vaccine

## 2022-03-12 DIAGNOSIS — K7031 Alcoholic cirrhosis of liver with ascites: Secondary | ICD-10-CM

## 2022-03-26 DIAGNOSIS — E8801 Alpha-1-antitrypsin deficiency: Secondary | ICD-10-CM | POA: Insufficient documentation

## 2022-03-26 DIAGNOSIS — K7210 Chronic hepatic failure without coma: Secondary | ICD-10-CM | POA: Insufficient documentation

## 2022-03-26 DIAGNOSIS — I8500 Esophageal varices without bleeding: Secondary | ICD-10-CM | POA: Insufficient documentation

## 2022-03-26 DIAGNOSIS — I8510 Secondary esophageal varices without bleeding: Secondary | ICD-10-CM | POA: Insufficient documentation

## 2022-03-30 ENCOUNTER — Encounter: Payer: Self-pay | Admitting: Gastroenterology

## 2022-03-30 ENCOUNTER — Ambulatory Visit: Payer: 59 | Admitting: Gastroenterology

## 2022-03-30 ENCOUNTER — Other Ambulatory Visit: Payer: Self-pay

## 2022-03-30 VITALS — BP 127/81 | HR 77 | Temp 98.1°F | Ht 70.0 in | Wt 197.0 lb

## 2022-03-30 DIAGNOSIS — I8510 Secondary esophageal varices without bleeding: Secondary | ICD-10-CM | POA: Diagnosis not present

## 2022-03-30 DIAGNOSIS — Z1211 Encounter for screening for malignant neoplasm of colon: Secondary | ICD-10-CM | POA: Diagnosis not present

## 2022-03-30 DIAGNOSIS — E8801 Alpha-1-antitrypsin deficiency: Secondary | ICD-10-CM | POA: Diagnosis not present

## 2022-03-30 DIAGNOSIS — K7460 Unspecified cirrhosis of liver: Secondary | ICD-10-CM

## 2022-03-30 MED ORDER — NA SULFATE-K SULFATE-MG SULF 17.5-3.13-1.6 GM/177ML PO SOLN: 1.0000 | Freq: Once | ORAL | 0 refills | Status: AC

## 2022-03-30 NOTE — Progress Notes (Signed)
[name] MD, MRCP(U.K) [address]  [place]  [place], [place] [address]  Main: [phone]  Fax: [phone]   Primary Care Physician: Wayland Denis, PA-C  Primary Gastroenterologist:  Dr. [name]   Chief Complaint  Patient presents with   Follow-up    Cirrhosis    HPI: Brian Vasquez is a 46 y.o. male  Summary of history :  He has been followed for alcoholic liver disease as well as severe deficiency of alpha 1 antitrypsin as a combination causing cirrhosis, decompensated ascites in 2022, nonbleeding esophageal varices that have been banded, concern for masslike lesion on ultrasound but no mass seen on MRI AFP elevated follow-up with Dr. Manuella Ghazi at [facility].  Biopsy was benign.   I initially met him in the hospital when he was admitted in October 2022 when he presented with ascites.ultrasound which showed cirrhosis with a 4.9 cm masslike lesion in the caudate lobe.  He underwent an MRI of the liver without contrast and with contrast to look at the lesion and showed no concern for malignancy simply noted focal nodular area of the liver.  Plan was to correlate with AFP which was elevated at 79.5.  Ascetic fluid showed no evidence of SBP and likely was due to portal hypertension.  INR was 1.7.  Hemoglobin 13.4.  Hepatitis B core antibody was positive, hepatitis C antibody , hepatitis B surface antibody was negative .    Admitted to the hospital for hyponatremia and discharged on 05/12/2021. Likely secondary to combination of over diuresis and salt restriction. He didn't get his labs a few days after his last increase in diurertics.    05/12/2021: EGD:  3 bands placed over non bleeding large esophageal varices. Duodenal bx shows no evidence of celiac disease.   Low alpha 1 antitrypsin levels, Elevated Ferritin and iron % saturation 88. INR 1.3 Ascitic fluid  shows SAAG suggestive of portal hypertension. AFP elevated 63.  MRI showed no clear mass but since AFP still elevated  will plan further evaluation   05/29/2021 MRI liver with and without contrast shows no evidence of HCC but does another area which shows hyperenhancement and repeat MRI in 3 months has been recommended.  Features of portal hypertension seen.  07/11/2021: Liver biopsy shows features of alpha-1 antitrypsin deficiency, chronic cirrhotic liver with inflammatory activity no dysplastic nodule or carcinoma seen. 08/22/2021 MRI liver with contrast shows no significant change since previous study.  LI-RADS 3 lesion noted.  Mild volume ascites.  Features of portal hypertension including splenomegaly and varices.   08/20/2021 EGD: Severe portal hypertensive gastropathy.  No esophageal varices 08/28/2021 was seen by [facility] for alpha-1 antitrypsin deficiency recommended getting vaccinations for hepatitis A and B.  09/23/2021: Followed up with Dr. Manuella Ghazi at [facility] had an MRI showing cirrhosis with portal hypertension 1.2 cm hepatic segment nodule compatible with benign biopsy no focal hepatic lesions    Interval history 12/23/2021-03/30/2022  02/23/2022: EGD: Portal hypertensive gastropathy no esophageal varices repeat EGD in 1 year  02/22/2022: Liver mass previously seen stable recommended repeat MRI in 6 months  03/25/2022 visit with Dr. Manuella Ghazi at [facility].  MELD score of 10 following with specialist for alpha-1 antitrypsin deficiency at pulmonary clinic on Lasix 20 mg and eplerenone 100 mg not required a paracentesis since January 2023 no recent episodes of hepatic encephalopathy  Due for colonoscopy for colon cancer screening Immune to hepatitis a  received pneumococcal vaccine in 2022 and recently influenza dose.  Up-to-date with zoster.  Received 2 out of 3 doses for hepatitis B at his primary care physician's office     Current Outpatient Medications  Medication Sig Dispense Refill   eplerenone (INSPRA) 50 MG tablet Take 1 tablet by mouth daily.     folic acid (FOLVITE) 1 MG tablet Take 1 tablet by mouth daily.      furosemide (LASIX) 20 MG tablet Take 1 tablet (20 mg total) by mouth daily. 30 tablet 2   Melatonin 10 MG TABS Take 20 mg by mouth at bedtime.     thiamine 100 MG tablet Take 1 tablet (100 mg total) by mouth daily.     Glucosamine HCl (GLUCOSAMINE PO) Take by mouth.     No current facility-administered medications for this visit.    Allergies as of 03/30/2022   (No Known Allergies)    ROS:  General: Negative for anorexia, weight loss, fever, chills, fatigue, weakness. ENT: Negative for hoarseness, difficulty swallowing , nasal congestion. CV: Negative for chest pain, angina, palpitations, dyspnea on exertion, peripheral edema.  Respiratory: Negative for dyspnea at rest, dyspnea on exertion, cough, sputum, wheezing.  GI: See history of present illness. GU:  Negative for dysuria, hematuria, urinary incontinence, urinary frequency, nocturnal urination.  Endo: Negative for unusual weight change.    Physical Examination:   BP 127/81 (BP Location: Left Arm, Patient Position: Sitting, Cuff Size: Normal)   Pulse 77   Temp 98.1 F (36.7 C) (Oral)   Ht _0  (1.778 m)   Wt 197 lb (89.4 kg)   BMI 28.27 kg/m   General: Well-nourished, well-developed in no acute distress.  Eyes: No icterus. Conjunctivae pink. Mouth: Oropharyngeal mucosa moist and pink , no lesions erythema or exudate. Skin: Warm and dry, no jaundice.   Psych: Alert and cooperative, normal mood and affect.   Imaging Studies: No results found.  Assessment and Plan:   Brian Vasquez is a 46 y.o. y/o male has a history of alcoholic liver and alpha 1 antitrypsin deficiency related cirrhosis, decompensation with ascites presented to the ER in October 2022 with abdominal distention and acute liver failure, non bleeding esophageal varices that have been banded.  Concern for masslike lesion on ultrasound but no mass seen on MRI but AFP has been elevated, follow-up with Dr. Manuella Ghazi at [facility] biopsy was benign. Did not tolerate 100  mg of Aldactone and 40 mg of Lasix as he developed gynecomastia, on eplerenone 100 mg and Lasix 40 mg positive tissue transglutaminase antibody but negative biopsies of the duodenum for celiac disease.  Tests for autoimmune liver disease was negative.  Elevated ferritin, very low alpha-1 antitrypsin levels, persistently elevated AFP, no recent episodes of hepatic encephalopathy  Plan 1.  No NSAIDs 2.  On eplerenone 100 mg a day and Lasix 20 mg a day to continue 4.  Low-sodium diet very compliant 5.  MRI liver shows stable liver mass repeat in February 2024 orders placed 6.  EGD for surveillance of esophageal varices August 2024 7.  Continue to stay off all alcohol: Has been in remission for many months and doing very well sarcopenia has improved and he has normal-appearing muscle mass 8.  Complete hepatitis B vaccination series with his primary care physician received 2 or 3 doses 9.  Follow-up with [facility] hepatology Dr. Manuella Ghazi.  She has been very compliant 10.  MELD is stable at 10 11: Colonoscopy for colon cancer screening   I have discussed alternative options, risks & benefits,  which include, but are not limited  to, bleeding, infection, perforation,respiratory complication & drug reaction.  The patient agrees with this plan & written consent will be obtained.     Dr [name]  MD,MRCP [facility]) Follow up in March 2023

## 2022-04-14 ENCOUNTER — Encounter: Payer: Self-pay | Admitting: Gastroenterology

## 2022-04-15 ENCOUNTER — Encounter: Payer: Self-pay | Admitting: Gastroenterology

## 2022-04-15 ENCOUNTER — Ambulatory Visit: Payer: 59 | Admitting: Anesthesiology

## 2022-04-15 ENCOUNTER — Ambulatory Visit (HOSPITAL_BASED_OUTPATIENT_CLINIC_OR_DEPARTMENT_OTHER)
Admission: RE | Admit: 2022-04-15 | Discharge: 2022-04-15 | Disposition: A | Discharge status: Home / Self Care | Payer: 59 | Source: Home / Self Care | Attending: Gastroenterology | Admitting: Gastroenterology

## 2022-04-15 ENCOUNTER — Encounter
Admission: RE | Disposition: A | Discharge status: Home / Self Care | Payer: Self-pay | Source: Home / Self Care | Attending: Gastroenterology

## 2022-04-15 DIAGNOSIS — K7031 Alcoholic cirrhosis of liver with ascites: Secondary | ICD-10-CM | POA: Insufficient documentation

## 2022-04-15 DIAGNOSIS — Z1211 Encounter for screening for malignant neoplasm of colon: Secondary | ICD-10-CM | POA: Insufficient documentation

## 2022-04-15 DIAGNOSIS — D126 Benign neoplasm of colon, unspecified: Secondary | ICD-10-CM

## 2022-04-15 DIAGNOSIS — Z87891 Personal history of nicotine dependence: Secondary | ICD-10-CM | POA: Insufficient documentation

## 2022-04-15 DIAGNOSIS — K810 Acute cholecystitis: Secondary | ICD-10-CM | POA: Diagnosis not present

## 2022-04-15 DIAGNOSIS — K635 Polyp of colon: Secondary | ICD-10-CM | POA: Insufficient documentation

## 2022-04-15 DIAGNOSIS — I10 Essential (primary) hypertension: Secondary | ICD-10-CM | POA: Insufficient documentation

## 2022-04-15 DIAGNOSIS — K8000 Calculus of gallbladder with acute cholecystitis without obstruction: Secondary | ICD-10-CM | POA: Diagnosis not present

## 2022-04-15 DIAGNOSIS — F1010 Alcohol abuse, uncomplicated: Secondary | ICD-10-CM | POA: Insufficient documentation

## 2022-04-15 HISTORY — PX: COLONOSCOPY WITH PROPOFOL: SHX5780

## 2022-04-15 SURGERY — COLONOSCOPY WITH PROPOFOL
Anesthesia: General

## 2022-04-15 MED ORDER — PROPOFOL 10 MG/ML IV BOLUS
INTRAVENOUS | Status: DC | PRN
  Administered 2022-04-15: 100 mg via INTRAVENOUS
  Administered 2022-04-15: 50 mg via INTRAVENOUS

## 2022-04-15 MED ORDER — SODIUM CHLORIDE 0.9 % IV SOLN: INTRAVENOUS | Status: DC

## 2022-04-15 MED ORDER — PROPOFOL 500 MG/50ML IV EMUL
INTRAVENOUS | Status: DC | PRN
  Administered 2022-04-15: 150 ug/kg/min via INTRAVENOUS

## 2022-04-15 MED ORDER — DEXMEDETOMIDINE HCL IN NACL 200 MCG/50ML IV SOLN
INTRAVENOUS | Status: DC | PRN
  Administered 2022-04-15: 8 ug via INTRAVENOUS

## 2022-04-15 NOTE — H&P (Signed)
Brian Bellows, MD [address], [place], [place], [place], [address] [address], [place], [place], [place], [address] Phone: [phone]  Fax: [phone]  Primary Care Physician:  Wayland Denis, PA-C   Pre-Procedure History & Physical: HPI:  Brian Vasquez is a 46 y.o. male is here for an colonoscopy.   Past Medical History:  Diagnosis Date   Alcoholic cirrhosis of liver with ascites ([place])    Cirrhosis ([place])    Esophageal varices ([place])    ETOH abuse    Hypertension    Hyponatremia 05/10/2021    Past Surgical History:  Procedure Laterality Date   ESOPHAGOGASTRODUODENOSCOPY (EGD) WITH PROPOFOL N/A 05/12/2021   Procedure: ESOPHAGOGASTRODUODENOSCOPY (EGD) WITH PROPOFOL;  Surgeon: Brian Bellows, MD;  Location: [facility] ENDOSCOPY;  Service: Gastroenterology;  Laterality: N/A;   ESOPHAGOGASTRODUODENOSCOPY (EGD) WITH PROPOFOL N/A 07/08/2021   Procedure: ESOPHAGOGASTRODUODENOSCOPY (EGD) WITH PROPOFOL;  Surgeon: Brian Bellows, MD;  Location: [facility] ENDOSCOPY;  Service: Gastroenterology;  Laterality: N/A;   ESOPHAGOGASTRODUODENOSCOPY (EGD) WITH PROPOFOL N/A 08/20/2021   Procedure: ESOPHAGOGASTRODUODENOSCOPY (EGD) WITH PROPOFOL;  Surgeon: Brian Bellows, MD;  Location: [facility] ENDOSCOPY;  Service: Gastroenterology;  Laterality: N/A;   ESOPHAGOGASTRODUODENOSCOPY (EGD) WITH PROPOFOL N/A 02/23/2022   Procedure: ESOPHAGOGASTRODUODENOSCOPY (EGD) WITH PROPOFOL;  Surgeon: Brian Bellows, MD;  Location: [facility] ENDOSCOPY;  Service: Gastroenterology;  Laterality: N/A;    Prior to Admission medications   Medication Sig Start Date End Date Taking? Authorizing Provider  eplerenone (INSPRA) 50 MG tablet Take 1 tablet by mouth daily. 12/10/21   [provider]  folic acid (FOLVITE) 1 MG tablet Take 1 tablet by mouth daily. 04/16/21   [provider]  furosemide (LASIX) 20 MG tablet Take 1 tablet (20 mg total) by mouth daily. 02/23/22   Brian Bellows, MD  Glucosamine HCl (GLUCOSAMINE PO) Take by  mouth.    [provider]  Melatonin 10 MG TABS Take 20 mg by mouth at bedtime.    [provider]  thiamine 100 MG tablet Take 1 tablet (100 mg total) by mouth daily. 04/16/21   Cherene Altes, MD    Allergies as of 03/31/2022   (No Known Allergies)    History reviewed. No pertinent family history.  Social History   Socioeconomic History   Marital status: Divorced    Spouse name: Not on file   Number of children: Not on file   Years of education: Not on file   Highest education level: Not on file  Occupational History   Not on file  Tobacco Use   Smoking status: Former    Types: Cigarettes   Smokeless tobacco: Never  Vaping Use   Vaping Use: Never used  Substance and Sexual Activity   Alcohol use: Not Currently   Drug use: Not Currently   Sexual activity: Not on file  Other Topics Concern   Not on file  Social History Narrative   Not on file   Social Determinants of Health   Financial Resource Strain: Not on file  Food Insecurity: Not on file  Transportation Needs: Not on file  Physical Activity: Not on file  Stress: Not on file  Social Connections: Not on file  Intimate Partner Violence: Not on file    Review of Systems: See HPI, otherwise negative ROS  Physical Exam: BP (!) 121/90   Pulse 81   Temp (!) 97.4 F (36.3 C) (Temporal)   Resp 18   Ht '5\' 10"'$  (1.778 m)   Wt 88.5 kg   SpO2 98%  BMI 27.98 kg/m  General:   Alert,  pleasant and cooperative in NAD Head:  Normocephalic and atraumatic. Neck:  Supple; no masses or thyromegaly. Lungs:  Clear throughout to auscultation, normal respiratory effort.    Heart:  +S1, +S2, Regular rate and rhythm, No edema. Abdomen:  Soft, nontender and nondistended. Normal bowel sounds, without guarding, and without rebound.   Neurologic:  Alert and  oriented x4;  grossly normal neurologically.  Impression/Plan: Colon Rueth is here for an colonoscopy to be performed for Screening  colonoscopy average risk   Risks, benefits, limitations, and alternatives regarding  colonoscopy have been reviewed with the patient.  Questions have been answered.  All parties agreeable.   Brian Bellows, MD  04/15/2022, 12:24 PM

## 2022-04-15 NOTE — Anesthesia Preprocedure Evaluation (Signed)
Anesthesia Evaluation  Patient identified by MRN, date of birth, ID band Patient awake    Reviewed: Allergy & Precautions, NPO status , Patient's Chart, lab work & pertinent test results  Airway Mallampati: III  TM Distance: >3 FB Neck ROM: full    Dental  (+) Chipped   Pulmonary neg pulmonary ROS, neg shortness of breath, former smoker,    Pulmonary exam normal        Cardiovascular Exercise Tolerance: Good hypertension, (-) anginanegative cardio ROS Normal cardiovascular exam     Neuro/Psych negative neurological ROS  negative psych ROS   GI/Hepatic negative GI ROS, Neg liver ROS, neg GERD  ,  Endo/Other  negative endocrine ROS  Renal/GU negative Renal ROS  negative genitourinary   Musculoskeletal   Abdominal   Peds  Hematology negative hematology ROS (+)   Anesthesia Other Findings Past Medical History: No date: Alcoholic cirrhosis of liver with ascites (HCC) No date: Cirrhosis (HCC) No date: Esophageal varices (HCC) No date: ETOH abuse No date: Hypertension 05/10/2021: Hyponatremia  Past Surgical History: 05/12/2021: ESOPHAGOGASTRODUODENOSCOPY (EGD) WITH PROPOFOL; N/A     Comment:  Procedure: ESOPHAGOGASTRODUODENOSCOPY (EGD) WITH               PROPOFOL;  Surgeon: [name], MD;  Location: [facility]               ENDOSCOPY;  Service: Gastroenterology;  Laterality: N/A; 07/08/2021: ESOPHAGOGASTRODUODENOSCOPY (EGD) WITH PROPOFOL; N/A     Comment:  Procedure: ESOPHAGOGASTRODUODENOSCOPY (EGD) WITH               PROPOFOL;  Surgeon: [name], MD;  Location: [facility]               ENDOSCOPY;  Service: Gastroenterology;  Laterality: N/A; 08/20/2021: ESOPHAGOGASTRODUODENOSCOPY (EGD) WITH PROPOFOL; N/A     Comment:  Procedure: ESOPHAGOGASTRODUODENOSCOPY (EGD) WITH               PROPOFOL;  Surgeon: [name], MD;  Location: [facility]               ENDOSCOPY;  Service: Gastroenterology;  Laterality: N/A; 02/23/2022:  ESOPHAGOGASTRODUODENOSCOPY (EGD) WITH PROPOFOL; N/A     Comment:  Procedure: ESOPHAGOGASTRODUODENOSCOPY (EGD) WITH               PROPOFOL;  Surgeon: [name], MD;  Location: [facility]               ENDOSCOPY;  Service: Gastroenterology;  Laterality: N/A;  BMI    Body Mass Index: 27.98 kg/m      Reproductive/Obstetrics negative OB ROS                             Anesthesia Physical Anesthesia Plan  ASA: 3  Anesthesia Plan: General   Post-op Pain Management:    Induction: Intravenous  PONV Risk Score and Plan: Propofol infusion and TIVA  Airway Management Planned: Natural Airway and Nasal Cannula  Additional Equipment:   Intra-op Plan:   Post-operative Plan:   Informed Consent: I have reviewed the patients History and Physical, chart, labs and discussed the procedure including the risks, benefits and alternatives for the proposed anesthesia with the patient or authorized representative who has indicated his/her understanding and acceptance.     Dental Advisory Given  Plan Discussed with: Anesthesiologist, CRNA and Surgeon  Anesthesia Plan Comments: (Patient consented for risks of anesthesia including but not limited to:  - adverse reactions to medications - risk of  airway placement if required - damage to eyes, teeth, lips or other oral mucosa - nerve damage due to positioning  - sore throat or hoarseness - Damage to heart, brain, nerves, lungs, other parts of body or loss of life  Patient voiced understanding.)        Anesthesia Quick Evaluation

## 2022-04-15 NOTE — Anesthesia Postprocedure Evaluation (Signed)
Anesthesia Post Note  Patient: Jakarius Flamenco  Procedure(s) Performed: COLONOSCOPY WITH PROPOFOL  Patient location during evaluation: Endoscopy Anesthesia Type: General Level of consciousness: awake and alert Pain management: pain level controlled Vital Signs Assessment: post-procedure vital signs reviewed and stable Respiratory status: spontaneous breathing, nonlabored ventilation, respiratory function stable and patient connected to nasal cannula oxygen Cardiovascular status: blood pressure returned to baseline and stable Postop Assessment: no apparent nausea or vomiting Anesthetic complications: no   No notable events documented.   Last Vitals:  Vitals:   04/15/22 1306 04/15/22 1316  BP: 112/66 103/72  Pulse: 74 75  Resp: (!) 21 20  Temp:    SpO2: 96% 97%    Last Pain:  Vitals:   04/15/22 1316  TempSrc:   PainSc: 0-No pain                 Precious Haws Syana Degraffenreid

## 2022-04-15 NOTE — Anesthesia Procedure Notes (Signed)
Procedure Name: MAC Date/Time: 04/15/2022 12:35 PM  Performed by: Biagio Borg, CRNAPre-anesthesia Checklist: Patient identified, Emergency Drugs available, Suction available, Patient being monitored and Timeout performed Patient Re-evaluated:Patient Re-evaluated prior to induction Oxygen Delivery Method: Nasal cannula Induction Type: IV induction Placement Confirmation: positive ETCO2 and CO2 detector

## 2022-04-15 NOTE — Op Note (Signed)
[facility] Gastroenterology Patient Name: Brian Vasquez Procedure Date: 04/15/2022 12:25 PM MRN: [id number] Account #: [id number] Date of Birth: 1976-03-20 Admit Type: Outpatient Age: 46 Room: [facility] ENDO ROOM 3 Gender: Male Note Status: Finalized Instrument Name: Jasper Riling [id number] Procedure:             Colonoscopy Indications:           Screening for colorectal malignant neoplasm Providers:             [name] MD, MD Referring MD:          Wayland Denis (Referring MD) Medicines:             Monitored Anesthesia Care Complications:         No immediate complications. Procedure:             Pre-Anesthesia Assessment:                        - Prior to the procedure, a History and Physical was                         performed, and patient medications, allergies and                         sensitivities were reviewed. The patient's tolerance                         of previous anesthesia was reviewed.                        - The risks and benefits of the procedure and the                         sedation options and risks were discussed with the                         patient. All questions were answered and informed                         consent was obtained.                        - ASA Grade Assessment: III - A patient with severe                         systemic disease.                        After obtaining informed consent, the colonoscope was                         passed under direct vision. Throughout the procedure,                         the patient's blood pressure, pulse, and oxygen                         saturations were monitored continuously. The                         Colonoscope was introduced through  the anus and                         advanced to the the cecum, identified by the                         appendiceal orifice. The colonoscopy was performed                         with ease. The patient tolerated the procedure well.                          The quality of the bowel preparation was excellent. Findings:      Two sessile polyps were found in the ascending colon. The polyps were 4       to 5 mm in size. These polyps were removed with a cold snare. Resection       and retrieval were complete.      The exam was otherwise without abnormality on direct and retroflexion       views. Impression:            - Two 4 to 5 mm polyps in the ascending colon, removed                         with a cold snare. Resected and retrieved.                        - The examination was otherwise normal on direct and                         retroflexion views. Recommendation:        - Discharge patient to home (with escort).                        - Resume previous diet.                        - Continue present medications.                        - Await pathology results.                        - Repeat colonoscopy for surveillance based on                         pathology results. Procedure Code(s):     --- Professional ---                        [id number], Colonoscopy, flexible; with removal of                         tumor(s), polyp(s), or other lesion(s) by snare                         technique Diagnosis Code(s):     --- Professional ---                        Z12.11, Encounter for screening for malignant neoplasm  of colon                        K63.5, Polyp of colon CPT copyright 2019 American Medical Association. All rights reserved. The codes documented in this report are preliminary and upon coder review may  be revised to meet current compliance requirements. [name], MD [name] MD, MD 04/15/2022 12:54:32 PM This report has been signed electronically. Number of Addenda: 0 Note Initiated On: 04/15/2022 12:25 PM Scope Withdrawal Time: 0 hours 12 minutes 21 seconds  Total Procedure Duration: 0 hours 14 minutes 39 seconds  Estimated Blood Loss:  Estimated blood loss: none.       [facility]

## 2022-04-15 NOTE — Transfer of Care (Signed)
Immediate Anesthesia Transfer of Care Note  Patient: Brian Vasquez  Procedure(s) Performed: COLONOSCOPY WITH PROPOFOL  Patient Location: PACU and Endoscopy Unit  Anesthesia Type:General  Level of Consciousness: awake  Airway & Oxygen Therapy: Patient Spontanous Breathing  Post-op Assessment: Report given to RN and Post -op Vital signs reviewed and stable  Post vital signs: Reviewed and stable  Last Vitals:  Vitals Value Taken Time  BP    Temp    Pulse    Resp    SpO2      Last Pain:  Vitals:   04/15/22 1256  TempSrc: Temporal  PainSc: Asleep         Complications: No notable events documented.

## 2022-04-16 ENCOUNTER — Other Ambulatory Visit: Payer: Self-pay

## 2022-04-16 ENCOUNTER — Inpatient Hospital Stay
Admission: EM | Admit: 2022-04-16 | Discharge: 2022-04-20 | DRG: 418 | Disposition: A | Discharge status: Home / Self Care | Payer: 59 | Attending: Internal Medicine | Admitting: Internal Medicine

## 2022-04-16 ENCOUNTER — Encounter
Admission: EM | Disposition: A | Discharge status: Home / Self Care | Payer: Self-pay | Source: Home / Self Care | Attending: Internal Medicine

## 2022-04-16 ENCOUNTER — Emergency Department: Payer: 59

## 2022-04-16 ENCOUNTER — Inpatient Hospital Stay: Payer: 59 | Admitting: Registered Nurse

## 2022-04-16 DIAGNOSIS — K7031 Alcoholic cirrhosis of liver with ascites: Secondary | ICD-10-CM | POA: Diagnosis present

## 2022-04-16 DIAGNOSIS — K810 Acute cholecystitis: Secondary | ICD-10-CM

## 2022-04-16 DIAGNOSIS — Z79899 Other long term (current) drug therapy: Secondary | ICD-10-CM | POA: Diagnosis not present

## 2022-04-16 DIAGNOSIS — Z87891 Personal history of nicotine dependence: Secondary | ICD-10-CM | POA: Diagnosis not present

## 2022-04-16 DIAGNOSIS — K922 Gastrointestinal hemorrhage, unspecified: Secondary | ICD-10-CM | POA: Diagnosis present

## 2022-04-16 DIAGNOSIS — D689 Coagulation defect, unspecified: Secondary | ICD-10-CM | POA: Diagnosis present

## 2022-04-16 DIAGNOSIS — E876 Hypokalemia: Secondary | ICD-10-CM | POA: Diagnosis not present

## 2022-04-16 DIAGNOSIS — K567 Ileus, unspecified: Secondary | ICD-10-CM | POA: Diagnosis present

## 2022-04-16 DIAGNOSIS — K635 Polyp of colon: Secondary | ICD-10-CM | POA: Diagnosis present

## 2022-04-16 DIAGNOSIS — F1011 Alcohol abuse, in remission: Secondary | ICD-10-CM | POA: Diagnosis present

## 2022-04-16 DIAGNOSIS — D696 Thrombocytopenia, unspecified: Secondary | ICD-10-CM | POA: Diagnosis present

## 2022-04-16 DIAGNOSIS — Y838 Other surgical procedures as the cause of abnormal reaction of the patient, or of later complication, without mention of misadventure at the time of the procedure: Secondary | ICD-10-CM | POA: Diagnosis present

## 2022-04-16 DIAGNOSIS — I10 Essential (primary) hypertension: Secondary | ICD-10-CM | POA: Diagnosis present

## 2022-04-16 DIAGNOSIS — K8000 Calculus of gallbladder with acute cholecystitis without obstruction: Principal | ICD-10-CM | POA: Diagnosis present

## 2022-04-16 DIAGNOSIS — E8801 Alpha-1-antitrypsin deficiency: Secondary | ICD-10-CM | POA: Diagnosis present

## 2022-04-16 DIAGNOSIS — R161 Splenomegaly, not elsewhere classified: Secondary | ICD-10-CM | POA: Diagnosis present

## 2022-04-16 DIAGNOSIS — K766 Portal hypertension: Secondary | ICD-10-CM | POA: Diagnosis present

## 2022-04-16 DIAGNOSIS — E871 Hypo-osmolality and hyponatremia: Secondary | ICD-10-CM | POA: Diagnosis not present

## 2022-04-16 DIAGNOSIS — T80818A Extravasation of other vesicant agent, initial encounter: Secondary | ICD-10-CM | POA: Diagnosis present

## 2022-04-16 DIAGNOSIS — K9189 Other postprocedural complications and disorders of digestive system: Secondary | ICD-10-CM | POA: Diagnosis present

## 2022-04-16 DIAGNOSIS — Z23 Encounter for immunization: Secondary | ICD-10-CM

## 2022-04-16 DIAGNOSIS — I8500 Esophageal varices without bleeding: Secondary | ICD-10-CM | POA: Diagnosis present

## 2022-04-16 DIAGNOSIS — K7460 Unspecified cirrhosis of liver: Secondary | ICD-10-CM | POA: Diagnosis present

## 2022-04-16 HISTORY — DX: Alpha-1-antitrypsin deficiency: E88.01

## 2022-04-16 HISTORY — PX: CHOLECYSTECTOMY: SHX55

## 2022-04-16 LAB — CBC WITH DIFFERENTIAL/PLATELET
Abs Immature Granulocytes: 0.02 10*3/uL (ref 0.00–0.07)
Basophils Absolute: 0 10*3/uL (ref 0.0–0.1)
Basophils Relative: 1 %
Eosinophils Absolute: 0.2 10*3/uL (ref 0.0–0.5)
Eosinophils Relative: 2 %
HCT: 46.1 % (ref 39.0–52.0)
Hemoglobin: 15.7 g/dL (ref 13.0–17.0)
Immature Granulocytes: 0 %
Lymphocytes Relative: 29 %
Lymphs Abs: 2.3 10*3/uL (ref 0.7–4.0)
MCH: 30.7 pg (ref 26.0–34.0)
MCHC: 34.1 g/dL (ref 30.0–36.0)
MCV: 90.2 fL (ref 80.0–100.0)
Monocytes Absolute: 0.8 10*3/uL (ref 0.1–1.0)
Monocytes Relative: 10 %
Neutro Abs: 4.6 10*3/uL (ref 1.7–7.7)
Neutrophils Relative %: 58 %
Platelets: 130 10*3/uL — ABNORMAL LOW (ref 150–400)
RBC: 5.11 MIL/uL (ref 4.22–5.81)
RDW: 14.2 % (ref 11.5–15.5)
WBC: 8 10*3/uL (ref 4.0–10.5)
nRBC: 0 % (ref 0.0–0.2)

## 2022-04-16 LAB — URINALYSIS, ROUTINE W REFLEX MICROSCOPIC
Bilirubin Urine: NEGATIVE
Glucose, UA: NEGATIVE mg/dL
Hgb urine dipstick: NEGATIVE
Ketones, ur: NEGATIVE mg/dL
Leukocytes,Ua: NEGATIVE
Nitrite: NEGATIVE
Protein, ur: NEGATIVE mg/dL
Specific Gravity, Urine: 1.021 (ref 1.005–1.030)
pH: 5 (ref 5.0–8.0)

## 2022-04-16 LAB — PROTIME-INR
INR: 1.4 — ABNORMAL HIGH (ref 0.8–1.2)
INR: 1.4 — ABNORMAL HIGH (ref 0.8–1.2)
Prothrombin Time: 16.8 seconds — ABNORMAL HIGH (ref 11.4–15.2)
Prothrombin Time: 17.2 seconds — ABNORMAL HIGH (ref 11.4–15.2)

## 2022-04-16 LAB — COMPREHENSIVE METABOLIC PANEL
ALT: 49 U/L — ABNORMAL HIGH (ref 0–44)
AST: 55 U/L — ABNORMAL HIGH (ref 15–41)
Albumin: 3.7 g/dL (ref 3.5–5.0)
Alkaline Phosphatase: 125 U/L (ref 38–126)
Anion gap: 5 (ref 5–15)
BUN: 13 mg/dL (ref 6–20)
CO2: 26 mmol/L (ref 22–32)
Calcium: 9.4 mg/dL (ref 8.9–10.3)
Chloride: 106 mmol/L (ref 98–111)
Creatinine, Ser: 0.78 mg/dL (ref 0.61–1.24)
GFR, Estimated: >60 mL/min (ref >60)
Glucose, Bld: 110 mg/dL — ABNORMAL HIGH (ref 70–99)
Potassium: 4.5 mmol/L (ref 3.5–5.1)
Sodium: 137 mmol/L (ref 135–145)
Total Bilirubin: 1.8 mg/dL — ABNORMAL HIGH (ref 0.3–1.2)
Total Protein: 7.4 g/dL (ref 6.5–8.1)

## 2022-04-16 LAB — CBC
HCT: 43.7 % (ref 39.0–52.0)
Hemoglobin: 15.3 g/dL (ref 13.0–17.0)
MCH: 30.8 pg (ref 26.0–34.0)
MCHC: 35 g/dL (ref 30.0–36.0)
MCV: 88.1 fL (ref 80.0–100.0)
Platelets: 119 10*3/uL — ABNORMAL LOW (ref 150–400)
RBC: 4.96 MIL/uL (ref 4.22–5.81)
RDW: 14.1 % (ref 11.5–15.5)
WBC: 19 10*3/uL — ABNORMAL HIGH (ref 4.0–10.5)
nRBC: 0 % (ref 0.0–0.2)

## 2022-04-16 LAB — LIPASE, BLOOD: Lipase: 42 U/L (ref 11–51)

## 2022-04-16 LAB — APTT: aPTT: 40 seconds — ABNORMAL HIGH (ref 24–36)

## 2022-04-16 LAB — ABO/RH: ABO/RH(D): A POS

## 2022-04-16 LAB — SURGICAL PATHOLOGY

## 2022-04-16 LAB — PREPARE RBC (CROSSMATCH)

## 2022-04-16 SURGERY — CHOLECYSTECTOMY, ROBOT-ASSISTED, LAPAROSCOPIC
Anesthesia: General | Site: Abdomen

## 2022-04-16 SURGERY — CHOLECYSTECTOMY, ROBOT-ASSISTED, LAPAROSCOPIC
Anesthesia: General

## 2022-04-16 MED ORDER — FENTANYL CITRATE (PF) 100 MCG/2ML IJ SOLN
INTRAMUSCULAR | Status: AC
  Filled 2022-04-16: qty 2

## 2022-04-16 MED ORDER — FUROSEMIDE 20 MG PO TABS: 20.0000 mg | ORAL_TABLET | Freq: Every day | ORAL | Status: DC

## 2022-04-16 MED ORDER — THIAMINE HCL 100 MG PO TABS
100.0000 mg | ORAL_TABLET | Freq: Every day | ORAL | Status: DC
  Administered 2022-04-17 – 2022-04-20 (×4): 100 mg via ORAL
  Filled 2022-04-16 (×7): qty 1

## 2022-04-16 MED ORDER — CHLORHEXIDINE GLUCONATE CLOTH 2 % EX PADS
6.0000 | MEDICATED_PAD | Freq: Every day | CUTANEOUS | Status: DC
  Administered 2022-04-17 – 2022-04-18 (×2): 6 via TOPICAL

## 2022-04-16 MED ORDER — SODIUM CHLORIDE 0.9% IV SOLUTION
Freq: Once | INTRAVENOUS | Status: DC
  Filled 2022-04-16: qty 250

## 2022-04-16 MED ORDER — OXYCODONE HCL 5 MG PO TABS
5.0000 mg | ORAL_TABLET | ORAL | Status: DC | PRN
  Administered 2022-04-16 – 2022-04-20 (×16): 5 mg via ORAL
  Filled 2022-04-16 (×16): qty 1

## 2022-04-16 MED ORDER — PROPOFOL 10 MG/ML IV BOLUS
INTRAVENOUS | Status: AC
  Filled 2022-04-16: qty 20

## 2022-04-16 MED ORDER — INDOCYANINE GREEN 25 MG IV SOLR
INTRAVENOUS | Status: DC | PRN
  Administered 2022-04-16: 2.5 mg via INTRAVENOUS

## 2022-04-16 MED ORDER — BUPIVACAINE-EPINEPHRINE (PF) 0.5% -1:200000 IJ SOLN
INTRAMUSCULAR | Status: DC | PRN
  Administered 2022-04-16: 50 mL

## 2022-04-16 MED ORDER — PROMETHAZINE HCL 25 MG/ML IJ SOLN: 6.2500 mg | INTRAMUSCULAR | Status: DC | PRN

## 2022-04-16 MED ORDER — VISTASEAL 10 ML SINGLE DOSE KIT
PACK | CUTANEOUS | Status: AC
  Filled 2022-04-16: qty 10

## 2022-04-16 MED ORDER — FENTANYL CITRATE PF 50 MCG/ML IJ SOSY
50.0000 ug | PREFILLED_SYRINGE | Freq: Once | INTRAMUSCULAR | Status: AC
  Administered 2022-04-16: 50 ug via INTRAVENOUS
  Filled 2022-04-16: qty 1

## 2022-04-16 MED ORDER — ACETAMINOPHEN 10 MG/ML IV SOLN
INTRAVENOUS | Status: AC
  Filled 2022-04-16: qty 100

## 2022-04-16 MED ORDER — STERILE WATER FOR IRRIGATION IR SOLN
Status: DC | PRN
  Administered 2022-04-16: 3000 mL

## 2022-04-16 MED ORDER — FENTANYL CITRATE (PF) 100 MCG/2ML IJ SOLN
INTRAMUSCULAR | Status: DC | PRN
  Administered 2022-04-16 (×4): 50 ug via INTRAVENOUS

## 2022-04-16 MED ORDER — LIDOCAINE HCL (PF) 2 % IJ SOLN
INTRAMUSCULAR | Status: AC
  Filled 2022-04-16: qty 5

## 2022-04-16 MED ORDER — ONDANSETRON HCL 4 MG PO TABS: 4.0000 mg | ORAL_TABLET | Freq: Four times a day (QID) | ORAL | Status: DC | PRN

## 2022-04-16 MED ORDER — PROPOFOL 10 MG/ML IV BOLUS
INTRAVENOUS | Status: DC | PRN
  Administered 2022-04-16: 160 mg via INTRAVENOUS

## 2022-04-16 MED ORDER — FENTANYL CITRATE (PF) 100 MCG/2ML IJ SOLN
25.0000 ug | INTRAMUSCULAR | Status: DC | PRN
  Administered 2022-04-16 (×3): 50 ug via INTRAVENOUS

## 2022-04-16 MED ORDER — LIDOCAINE HCL (CARDIAC) PF 100 MG/5ML IV SOSY
PREFILLED_SYRINGE | INTRAVENOUS | Status: DC | PRN
  Administered 2022-04-16: 60 mg via INTRAVENOUS

## 2022-04-16 MED ORDER — FOLIC ACID 1 MG PO TABS
1.0000 mg | ORAL_TABLET | Freq: Every day | ORAL | Status: DC
  Administered 2022-04-17 – 2022-04-20 (×4): 1 mg via ORAL
  Filled 2022-04-16 (×4): qty 1

## 2022-04-16 MED ORDER — BUPIVACAINE-EPINEPHRINE (PF) 0.25% -1:200000 IJ SOLN
INTRAMUSCULAR | Status: AC
  Filled 2022-04-16: qty 30

## 2022-04-16 MED ORDER — ONDANSETRON HCL 4 MG/2ML IJ SOLN
4.0000 mg | Freq: Once | INTRAMUSCULAR | Status: AC
  Administered 2022-04-16: 4 mg via INTRAVENOUS
  Filled 2022-04-16: qty 2

## 2022-04-16 MED ORDER — VISTASEAL 10 ML SINGLE DOSE KIT
PACK | CUTANEOUS | Status: DC | PRN
  Administered 2022-04-16: 10 mL via TOPICAL

## 2022-04-16 MED ORDER — INDOCYANINE GREEN 25 MG IV SOLR: 2.5000 mg | Freq: Once | INTRAVENOUS | Status: DC

## 2022-04-16 MED ORDER — SODIUM CHLORIDE 0.9 % IV SOLN: INTRAVENOUS | Status: AC

## 2022-04-16 MED ORDER — HEMOSTATIC AGENTS (NO CHARGE) OPTIME: TOPICAL | Status: DC | PRN

## 2022-04-16 MED ORDER — MIDAZOLAM HCL 2 MG/2ML IJ SOLN
INTRAMUSCULAR | Status: AC
  Filled 2022-04-16: qty 2

## 2022-04-16 MED ORDER — 0.9 % SODIUM CHLORIDE (POUR BTL) OPTIME
TOPICAL | Status: DC | PRN
  Administered 2022-04-16 (×2): 500 mL

## 2022-04-16 MED ORDER — DEXMEDETOMIDINE HCL IN NACL 80 MCG/20ML IV SOLN
INTRAVENOUS | Status: AC
  Filled 2022-04-16: qty 20

## 2022-04-16 MED ORDER — ONDANSETRON HCL 4 MG/2ML IJ SOLN
INTRAMUSCULAR | Status: AC
  Filled 2022-04-16: qty 2

## 2022-04-16 MED ORDER — LACTATED RINGERS IV SOLN: INTRAVENOUS | Status: DC | PRN

## 2022-04-16 MED ORDER — FENTANYL CITRATE (PF) 100 MCG/2ML IJ SOLN: 50.0000 ug | Freq: Once | INTRAMUSCULAR | Status: AC

## 2022-04-16 MED ORDER — BUPIVACAINE LIPOSOME 1.3 % IJ SUSP
INTRAMUSCULAR | Status: AC
  Filled 2022-04-16: qty 20

## 2022-04-16 MED ORDER — ONDANSETRON HCL 4 MG/2ML IJ SOLN
INTRAMUSCULAR | Status: DC | PRN
  Administered 2022-04-16: 4 mg via INTRAVENOUS

## 2022-04-16 MED ORDER — IOHEXOL 300 MG/ML  SOLN
100.0000 mL | Freq: Once | INTRAMUSCULAR | Status: AC | PRN
  Administered 2022-04-16: 100 mL via INTRAVENOUS

## 2022-04-16 MED ORDER — SUCCINYLCHOLINE CHLORIDE 200 MG/10ML IV SOSY
PREFILLED_SYRINGE | INTRAVENOUS | Status: DC | PRN
  Administered 2022-04-16: 100 mg via INTRAVENOUS

## 2022-04-16 MED ORDER — DEXMEDETOMIDINE HCL IN NACL 80 MCG/20ML IV SOLN
INTRAVENOUS | Status: DC | PRN
  Administered 2022-04-16: 8 ug via BUCCAL

## 2022-04-16 MED ORDER — DEXAMETHASONE SODIUM PHOSPHATE 10 MG/ML IJ SOLN
INTRAMUSCULAR | Status: AC
  Filled 2022-04-16: qty 1

## 2022-04-16 MED ORDER — MIDAZOLAM HCL 2 MG/2ML IJ SOLN
INTRAMUSCULAR | Status: DC | PRN
  Administered 2022-04-16: 2 mg via INTRAVENOUS

## 2022-04-16 MED ORDER — SPIRONOLACTONE 25 MG PO TABS
25.0000 mg | ORAL_TABLET | Freq: Every day | ORAL | Status: DC
  Administered 2022-04-17: 25 mg via ORAL
  Filled 2022-04-16: qty 1

## 2022-04-16 MED ORDER — ROCURONIUM BROMIDE 100 MG/10ML IV SOLN
INTRAVENOUS | Status: DC | PRN
  Administered 2022-04-16: 30 mg via INTRAVENOUS
  Administered 2022-04-16 (×2): 20 mg via INTRAVENOUS

## 2022-04-16 MED ORDER — MORPHINE SULFATE (PF) 2 MG/ML IV SOLN
2.0000 mg | INTRAVENOUS | Status: DC | PRN
  Administered 2022-04-16 – 2022-04-20 (×10): 2 mg via INTRAVENOUS
  Filled 2022-04-16 (×10): qty 1

## 2022-04-16 MED ORDER — ROCURONIUM BROMIDE 10 MG/ML (PF) SYRINGE
PREFILLED_SYRINGE | INTRAVENOUS | Status: AC
  Filled 2022-04-16: qty 10

## 2022-04-16 MED ORDER — ONDANSETRON HCL 4 MG/2ML IJ SOLN: 4.0000 mg | Freq: Four times a day (QID) | INTRAMUSCULAR | Status: DC | PRN

## 2022-04-16 MED ORDER — FENTANYL CITRATE (PF) 100 MCG/2ML IJ SOLN
INTRAMUSCULAR | Status: AC
  Administered 2022-04-16: 50 ug via INTRAVENOUS
  Filled 2022-04-16: qty 2

## 2022-04-16 MED ORDER — DEXAMETHASONE SODIUM PHOSPHATE 10 MG/ML IJ SOLN
INTRAMUSCULAR | Status: DC | PRN
  Administered 2022-04-16: 10 mg via INTRAVENOUS

## 2022-04-16 MED ORDER — PIPERACILLIN-TAZOBACTAM 3.375 G IVPB 30 MIN
3.3750 g | Freq: Once | INTRAVENOUS | Status: AC
  Administered 2022-04-16: 3.375 g via INTRAVENOUS
  Filled 2022-04-16: qty 50

## 2022-04-16 MED ORDER — PIPERACILLIN-TAZOBACTAM 3.375 G IVPB
3.3750 g | Freq: Three times a day (TID) | INTRAVENOUS | Status: DC
  Administered 2022-04-16 – 2022-04-20 (×11): 3.375 g via INTRAVENOUS
  Filled 2022-04-16 (×11): qty 50

## 2022-04-16 SURGICAL SUPPLY — 51 items
APPLICATOR VISTASEAL 35 (MISCELLANEOUS) IMPLANT
CANNULA REDUC XI 12-8 STAPL (CANNULA) ×1
CANNULA REDUCER 12-8 DVNC XI (CANNULA) ×1 IMPLANT
CATH REDDICK CHOLANGI 4FR 50CM (CATHETERS) IMPLANT
CLIP LIGATING HEMO O LOK GREEN (MISCELLANEOUS) ×1 IMPLANT
DERMABOND ADVANCED .7 DNX12 (GAUZE/BANDAGES/DRESSINGS) ×1 IMPLANT
DRAPE ARM DVNC X/XI (DISPOSABLE) ×4 IMPLANT
DRAPE COLUMN DVNC XI (DISPOSABLE) ×1 IMPLANT
DRAPE DA VINCI XI ARM (DISPOSABLE) ×4
DRAPE DA VINCI XI COLUMN (DISPOSABLE) ×1
ELECT CAUTERY BLADE 6.4 (BLADE) ×1 IMPLANT
ELECT REM PT RETURN 9FT ADLT (ELECTROSURGICAL) ×1
ELECTRODE REM PT RTRN 9FT ADLT (ELECTROSURGICAL) ×1 IMPLANT
GLOVE BIO SURGEON STRL SZ7 (GLOVE) ×2 IMPLANT
GOWN STRL REUS W/ TWL LRG LVL3 (GOWN DISPOSABLE) ×4 IMPLANT
GOWN STRL REUS W/TWL LRG LVL3 (GOWN DISPOSABLE) ×4
HEMOSTAT SURGICEL 2X14 (HEMOSTASIS) IMPLANT
IRRIGATION STRYKERFLOW (MISCELLANEOUS) IMPLANT
IRRIGATOR STRYKERFLOW (MISCELLANEOUS) ×1
IV CATH ANGIO 12GX3 LT BLUE (NEEDLE) IMPLANT
KIT PINK PAD W/HEAD ARE REST (MISCELLANEOUS) ×1 IMPLANT
KIT PINK PAD W/HEAD ARM REST (MISCELLANEOUS) ×1 IMPLANT
LABEL OR SOLS (LABEL) ×1 IMPLANT
MANIFOLD NEPTUNE II (INSTRUMENTS) ×1 IMPLANT
NEEDLE HYPO 22GX1.5 SAFETY (NEEDLE) ×1 IMPLANT
NS IRRIG 500ML POUR BTL (IV SOLUTION) ×1 IMPLANT
OBTURATOR OPTICAL STANDARD 8MM (TROCAR) ×1
OBTURATOR OPTICAL STND 8 DVNC (TROCAR) ×1
OBTURATOR OPTICALSTD 8 DVNC (TROCAR) ×1 IMPLANT
PACK LAP CHOLECYSTECTOMY (MISCELLANEOUS) ×1 IMPLANT
PENCIL SMOKE EVACUATOR (MISCELLANEOUS) ×1 IMPLANT
SEAL CANN UNIV 5-8 DVNC XI (MISCELLANEOUS) ×3 IMPLANT
SEAL XI 5MM-8MM UNIVERSAL (MISCELLANEOUS) ×3
SET TUBE SMOKE EVAC HIGH FLOW (TUBING) ×1 IMPLANT
SOLUTION ELECTROLUBE (MISCELLANEOUS) ×1 IMPLANT
SPIKE FLUID TRANSFER (MISCELLANEOUS) ×1 IMPLANT
SPONGE T-LAP 18X18 ~~LOC~~+RFID (SPONGE) ×1 IMPLANT
SPONGE T-LAP 4X18 ~~LOC~~+RFID (SPONGE) IMPLANT
STAPLER CANNULA SEAL DVNC XI (STAPLE) ×1 IMPLANT
STAPLER CANNULA SEAL XI (STAPLE) ×1
STOPCOCK 3 WAY MALE LL (IV SETS)
STOPCOCK 3WAY MALE LL (IV SETS) IMPLANT
SUT MNCRL AB 4-0 PS2 18 (SUTURE) ×1 IMPLANT
SUT VICRYL 0 AB UR-6 (SUTURE) ×2 IMPLANT
SYR 20ML LL LF (SYRINGE) ×1 IMPLANT
SYR 30ML LL (SYRINGE) ×1 IMPLANT
SYS BAG RETRIEVAL 10MM (BASKET) ×1
SYSTEM BAG RETRIEVAL 10MM (BASKET) ×1 IMPLANT
TRAP FLUID SMOKE EVACUATOR (MISCELLANEOUS) ×1 IMPLANT
WATER STERILE IRR 3000ML UROMA (IV SOLUTION) IMPLANT
WATER STERILE IRR 500ML POUR (IV SOLUTION) ×1 IMPLANT

## 2022-04-16 NOTE — Assessment & Plan Note (Signed)
Treatment as outlined in 2 

## 2022-04-16 NOTE — H&P (Signed)
History and Physical    Patient: Brian Vasquez TIW:[id number] DOB: January 07, 1976 DOA: 04/16/2022 DOS: the patient was seen and examined on 04/16/2022 PCP: Wayland Denis, PA-C  Patient coming from: Home  Chief Complaint: No chief complaint on file.  HPI: Brian Vasquez is a 45 y.o. male with medical history significant for liver cirrhosis secondary to alpha-1 antitrypsin deficiency with portal hypertension, remote history of alcohol abuse who presents to the emergency room for evaluation of abdominal pain following a colonoscopy which was done on 04/15/22. Patient states that he had a routine colonoscopy done and had 2 polyps removed.  He was discharged home in stable condition.  He developed abdominal pain mostly in the right upper quadrant with radiation across the anterior abdominal wall after dinner 1 day prior to his admission.  Dinner consisted of a grilled chicken sandwich.  He rated his pain an 8 x 10 in intensity at its worst and thought it was related to gas from his procedure.  Pain has been persistent and associated with chills, nausea and vomiting.  He had a bowel movement on the morning of admission and stated that it was loose in consistency.  Most likely related to recent prep for his colonoscopy. He denies having any chest pain, no cough, no headache, no dizziness, no lightheadedness, no urinary symptoms, no blurred vision, no focal deficit. Patient had a CT scan of abdomen and pelvis done with contrast in the ER which showed  Gallbladder wall thickening with stones and hyperdense intraluminal contents, findings are concerning for hemorrhagic cholecystitis. There is in intraluminal hyperdense focus which increases in size on delayed imaging, concerning for intraluminal active bleeding. Cirrhotic liver morphology with evidence of portal hypertension including splenomegaly and varices. Small lesions of the right hepatic lobe described on prior MRI not well visualized, likely due to  contrast timing. Surgery was consulted in the ER and requested admission to medicine.    Review of Systems: As mentioned in the history of present illness. All other systems reviewed and are negative. Past Medical History:  Diagnosis Date   Alcoholic cirrhosis of liver with ascites ([place])    Cirrhosis ([place])    Esophageal varices ([place])    ETOH abuse    Hypertension    Hyponatremia 05/10/2021   Past Surgical History:  Procedure Laterality Date   ESOPHAGOGASTRODUODENOSCOPY (EGD) WITH PROPOFOL N/A 05/12/2021   Procedure: ESOPHAGOGASTRODUODENOSCOPY (EGD) WITH PROPOFOL;  Surgeon: [name], MD;  Location: [facility] ENDOSCOPY;  Service: Gastroenterology;  Laterality: N/A;   ESOPHAGOGASTRODUODENOSCOPY (EGD) WITH PROPOFOL N/A 07/08/2021   Procedure: ESOPHAGOGASTRODUODENOSCOPY (EGD) WITH PROPOFOL;  Surgeon: [name], MD;  Location: [facility] ENDOSCOPY;  Service: Gastroenterology;  Laterality: N/A;   ESOPHAGOGASTRODUODENOSCOPY (EGD) WITH PROPOFOL N/A 08/20/2021   Procedure: ESOPHAGOGASTRODUODENOSCOPY (EGD) WITH PROPOFOL;  Surgeon: [name], MD;  Location: [facility] ENDOSCOPY;  Service: Gastroenterology;  Laterality: N/A;   ESOPHAGOGASTRODUODENOSCOPY (EGD) WITH PROPOFOL N/A 02/23/2022   Procedure: ESOPHAGOGASTRODUODENOSCOPY (EGD) WITH PROPOFOL;  Surgeon: [name], MD;  Location: [facility] ENDOSCOPY;  Service: Gastroenterology;  Laterality: N/A;   Social History:  reports that he has quit smoking. His smoking use included cigarettes. He has never used smokeless tobacco. He reports that he does not currently use alcohol. He reports that he does not currently use drugs.  Not on File  No family history on file.  Prior to Admission medications   Medication Sig Start Date End Date Taking? Authorizing Provider  eplerenone (INSPRA) 50 MG tablet Take 2 tablets by mouth daily. 12/10/21  Yes [provider]  folic acid (FOLVITE) 1 MG tablet Take 1 tablet by mouth daily. 04/16/21  Yes [provider]   furosemide (LASIX) 20 MG tablet Take 1 tablet (20 mg total) by mouth daily. 02/23/22  Yes [name], MD  Melatonin 10 MG TABS Take 20 mg by mouth at bedtime.   Yes [provider]  thiamine 100 MG tablet Take 1 tablet (100 mg total) by mouth daily. 04/16/21  Yes Cherene Altes, MD  Glucosamine HCl (GLUCOSAMINE PO) Take by mouth.    [provider]    Physical Exam: Vitals:   04/16/22 1122 04/16/22 1124  BP: (!) 142/99   Pulse: 66   Resp: 20   Temp: 98 F (36.7 C)   SpO2: 95%   Weight:  88.5 kg   Physical Exam Vitals and nursing note reviewed.  Constitutional:      Appearance: He is normal weight.  HENT:     Head: Normocephalic and atraumatic.     Nose: Nose normal.     Mouth/Throat:     Mouth: Mucous membranes are moist.  Eyes:     Conjunctiva/sclera: Conjunctivae normal.  Cardiovascular:     Rate and Rhythm: Normal rate and regular rhythm.  Pulmonary:     Effort: Pulmonary effort is normal.     Breath sounds: Normal breath sounds.  Abdominal:     General: Abdomen is flat.     Palpations: Abdomen is soft.     Tenderness: There is abdominal tenderness.     Comments: Right upper quadrant tenderness  Musculoskeletal:        General: Normal range of motion.     Cervical back: Normal range of motion and neck supple.  Skin:    General: Skin is warm and dry.  Neurological:     General: No focal deficit present.     Mental Status: He is alert.  Psychiatric:        Mood and Affect: Mood normal.        Behavior: Behavior normal.     Data Reviewed: Relevant notes from primary care and specialist visits, past discharge summaries as available in EHR, including Care Everywhere. Prior diagnostic testing as pertinent to current admission diagnoses Updated medications and problem lists for reconciliation ED course, including vitals, labs, imaging, treatment and response to treatment Triage notes, nursing and pharmacy notes and ED provider's  notes Notable results as noted in HPI Labs reviewed.  PT 16.8, INR 1.4, lipase 42, sodium 137, potassium 4.5, chloride 106, bicarb 26, glucose 110, BUN 13, creatinine 0.70, calcium 9.4, total protein 7.4, albumin 3.7, AST 55, ALT 49, alkaline phosphatase 125, white count 8.0, hemoglobin 15.7, hematocrit 46.1, platelet count 130 CT scan of abdomen and pelvis shows gallbladder wall thickening with stones and hyperdense intraluminal contents, findings are concerning for hemorrhagic cholecystitis. There is in intraluminal hyperdense focus which increases in size on delayed imaging, concerning for intraluminal active bleeding. Cirrhotic liver morphology with evidence of portal hypertension including splenomegaly and varices. Small lesions of the right hepatic lobe described on prior MRI not well visualized, likely due to contrast timing. Aortic Atherosclerosis  Right upper quadrant ultrasound shows gallbladder is distended by sludge and multiple stones. Gallbladder wall thickening is noted, which in the setting of underlying cirrhosis may be a nonspecific finding. If there is clinical concern for acute cholecystitis consider nuclear medicine HIDA scan. Morphologic features of the liver compatible with cirrhosis. Indeterminate hypoechoic structure within the right lobe of liver measuring 2.6 cm. This  may represent a small hepatic adenoma or focal nodular hyperplasia. On the recent MRI of the abdomen several indeterminate lesions were identified for which follow-up imaging is advised in 6 months. Twelve-lead EKG reviewed by me shows normal sinus rhythm Colonoscopy showed 2 polyps in the ascending colon that were removed. There are no new results to review at this time.  Assessment and Plan: * Acute cholecystitis due to biliary calculus Patient presents for evaluation of abdominal pain associated with nausea, vomiting and chills. Imaging shows gallbladder wall thickening with stones and hyperdense  intraluminal contents, findings are concerning for hemorrhagic cholecystitis. There is in intraluminal hyperdense focus which increases in size on delayed imaging, concerning for intraluminal active bleeding. We will place patient on IV Zosyn Keep n.p.o. Surgical consult  Portal venous hypertension ([place]) Patient has a history of liver cirrhosis secondary to alpha-1 antitrypsin deficiency evidence of portal venous hypertension. Continue spironolactone and Lasix  Liver cirrhosis ([place]) Treatment as outlined in 2      Advance Care Planning:   Code Status: Full Code   Consults: Surgery  Family Communication: Greater than 50% of time was spent discussing patient's condition and plan of care with him at the bedside.  All questions and concerns have been addressed.  He verbalizes understanding and agrees with the plan.  Severity of Illness: The appropriate patient status for this patient is INPATIENT. Inpatient status is judged to be reasonable and necessary in order to provide the required intensity of service to ensure the patient's safety. The patient's presenting symptoms, physical exam findings, and initial radiographic and laboratory data in the context of their chronic comorbidities is felt to place them at high risk for further clinical deterioration. Furthermore, it is not anticipated that the patient will be medically stable for discharge from the hospital within 2 midnights of admission.   * I certify that at the point of admission it is my clinical judgment that the patient will require inpatient hospital care spanning beyond 2 midnights from the point of admission due to high intensity of service, high risk for further deterioration and high frequency of surveillance required.*  Author: Collier Bullock, MD 04/16/2022 3:22 PM  For on call review [email or web address].

## 2022-04-16 NOTE — Consult Note (Signed)
Pharmacy Antibiotic Note  Brian Vasquez is a 46 y.o. male admitted on 04/16/2022 with  acute cholecystitis .  Pharmacy has been consulted for Zosyn dosing.  Assessment: 46 yo M with PMH liver cirrhosis secondary to alpha-1 antitrypsin deficiency with portal hypertension, remote history of alcohol abuse presents with abdominal pain following a colonoscopy (2 polyps removed) on 10/11. Was stable after coloscopy but now pain radiating across right upper quadrant after dinner last night. CTAP shows findings c/f hemorrhagic cholecystitis and possible active bleeding. Hgb currently stable at 15.7, and pt is afebrile and VSS with no leukocytosis. No cultures collected yet. Pt received Zosyn 3.375 g x 1 in the ED. Laparoscopic cholecystectomy planned for today. Renal function stable.  Plan: Initiate Zosyn 3.375 g IV q8H Monitor renal function to assess for any necessary changes in dosing regimen Follow up culture results (if applicable) to assess for opportunities to narrow therapy  Weight: 88.5 kg (195 lb)  Temp (24hrs), Avg:98 F (36.7 C), Min:98 F (36.7 C), Max:98 F (36.7 C)  Recent Labs  Lab 04/16/22 1130  WBC 8.0  CREATININE 0.78    Estimated Creatinine Clearance: 127.9 mL/min (by C-G formula based on SCr of 0.78 mg/dL).    Not on File  Antimicrobials this admission: Zosyn 10/12 >>   Dose adjustments this admission: N/A  Microbiology results: N/A  Thank you for allowing pharmacy to be a part of this patient's care.  Dara Hoyer, PharmD PGY-1 Pharmacy Resident 04/16/2022 3:33 PM

## 2022-04-16 NOTE — Anesthesia Procedure Notes (Signed)
Procedure Name: Intubation Date/Time: 04/16/2022 4:38 PM  Performed by: Jerrye Noble, CRNAPre-anesthesia Checklist: Patient identified Patient Re-evaluated:Patient Re-evaluated prior to induction Oxygen Delivery Method: Circle system utilized Preoxygenation: Pre-oxygenation with 100% oxygen Induction Type: IV induction Ventilation: Mask ventilation without difficulty Laryngoscope Size: McGraph and 3 Grade View: Grade I Tube type: Oral Tube size: 7.5 mm Number of attempts: 1 Airway Equipment and Method: Stylet, Oral airway and Video-laryngoscopy Placement Confirmation: ETT inserted through vocal cords under direct vision, positive ETCO2 and breath sounds checked- equal and bilateral Secured at: 22 cm Tube secured with: Tape Dental Injury: Teeth and Oropharynx as per pre-operative assessment

## 2022-04-16 NOTE — ED Notes (Signed)
Dr. Kirstie Mirza at bedside

## 2022-04-16 NOTE — Consult Note (Signed)
Patient ID: Brian Vasquez, male   DOB: Feb 17, 1976, 46 y.o.   MRN: [id number]  HPI Brian Vasquez is a 46 y.o. male seen in consultation at the request of Dr. Jari Pigg.  Presents with an acute onset of abdominal pain that started last night and exacerbated this morning.  The pain is moderate to severe intermittent located in the epigastric area and right upper quadrant.  Associated nausea. He did have history of colonoscopy with polypectomy by Dr. Vicente Males yesterday and he did okay up until last night. Does have a history of alpha 1 antitrypsin history of alcohol abuse.  He is sober he did have history of decompensated cirrhosis last year that was able to be managed medically appropriately.  Did have a recent MRI last month that have personally reviewed showing stable liver lesions.  More importantly the gallbladder on that scan is normal.  Today he did have a CT scan that I have personally reviewed and discussed in detail with Dr. Burt Ek.  There is evidence of acute cholecystitis with hemorrhagic component, concerns for intraluminal active bleeding.  His INR is 1.4 platelet count is 130,000. Hb 15.7. TB 1.8 AST,   HPI  Past Medical History:  Diagnosis Date   Alcoholic cirrhosis of liver with ascites ([place])    Cirrhosis ([employer])    Esophageal varices ([facility])    ETOH abuse    Hypertension    Hyponatremia 05/10/2021    Past Surgical History:  Procedure Laterality Date   ESOPHAGOGASTRODUODENOSCOPY (EGD) WITH PROPOFOL N/A 05/12/2021   Procedure: ESOPHAGOGASTRODUODENOSCOPY (EGD) WITH PROPOFOL;  Surgeon: [name], MD;  Location: [facility] ENDOSCOPY;  Service: Gastroenterology;  Laterality: N/A;   ESOPHAGOGASTRODUODENOSCOPY (EGD) WITH PROPOFOL N/A 07/08/2021   Procedure: ESOPHAGOGASTRODUODENOSCOPY (EGD) WITH PROPOFOL;  Surgeon: [name], MD;  Location: [facility] ENDOSCOPY;  Service: Gastroenterology;  Laterality: N/A;   ESOPHAGOGASTRODUODENOSCOPY (EGD) WITH PROPOFOL N/A 08/20/2021   Procedure:  ESOPHAGOGASTRODUODENOSCOPY (EGD) WITH PROPOFOL;  Surgeon: [name], MD;  Location: [facility] ENDOSCOPY;  Service: Gastroenterology;  Laterality: N/A;   ESOPHAGOGASTRODUODENOSCOPY (EGD) WITH PROPOFOL N/A 02/23/2022   Procedure: ESOPHAGOGASTRODUODENOSCOPY (EGD) WITH PROPOFOL;  Surgeon: [name], MD;  Location: [facility] ENDOSCOPY;  Service: Gastroenterology;  Laterality: N/A;    No family history on file.  Social History Social History   Tobacco Use   Smoking status: Former    Types: Cigarettes   Smokeless tobacco: Never  Vaping Use   Vaping Use: Never used  Substance Use Topics   Alcohol use: Not Currently   Drug use: Not Currently    Not on File  Current Facility-Administered Medications  Medication Dose Route Frequency Provider Last Rate Last Admin   0.9 %  sodium chloride infusion (Manually program via Guardrails IV Fluids)   Intravenous Once Jakyra Kenealy F, MD       0.9 %  sodium chloride infusion   Intravenous Continuous Agbata, Tochukwu, MD       [START ON 04/17/2022] Chlorhexidine Gluconate Cloth 2 % PADS 6 each  6 each Topical Q0600 Yaretsi Humphres F, MD       [START ON 89/21/1941] folic acid (FOLVITE) tablet 1 mg  1 mg Oral Daily Agbata, Tochukwu, MD       [START ON 04/17/2022] furosemide (LASIX) tablet 20 mg  20 mg Oral Daily Agbata, Tochukwu, MD       morphine (PF) 2 MG/ML injection 2 mg  2 mg Intravenous Q4H PRN Agbata, Tochukwu, MD       ondansetron (ZOFRAN) tablet 4 mg  4 mg Oral Q6H  PRN Collier Bullock, MD       Or   ondansetron (ZOFRAN) injection 4 mg  4 mg Intravenous Q6H PRN Agbata, Tochukwu, MD       piperacillin-tazobactam (ZOSYN) IVPB 3.375 g  3.375 g Intravenous Q8H Delena Bali, Continuous [facility]       [START ON 04/17/2022] spironolactone (ALDACTONE) tablet 25 mg  25 mg Oral Daily Agbata, Tochukwu, MD       [START ON 04/17/2022] thiamine (VITAMIN B1) tablet 100 mg  100 mg Oral Daily Agbata, Tochukwu, MD       Current Outpatient Medications  Medication Sig Dispense  Refill   eplerenone (INSPRA) 50 MG tablet Take 2 tablets by mouth daily.     folic acid (FOLVITE) 1 MG tablet Take 1 tablet by mouth daily.     furosemide (LASIX) 20 MG tablet Take 1 tablet (20 mg total) by mouth daily. 30 tablet 2   Melatonin 10 MG TABS Take 20 mg by mouth at bedtime.     thiamine 100 MG tablet Take 1 tablet (100 mg total) by mouth daily.     Glucosamine HCl (GLUCOSAMINE PO) Take by mouth.       Review of Systems Full ROS  was asked and was negative except for the information on the HPI  Physical Exam Blood pressure (!) 142/99, pulse 64, temperature 98 F (36.7 C), resp. rate 15, weight 88.5 kg, SpO2 98 %. CONSTITUTIONAL: NAD. EYES: Pupils are equal, round,  Sclera are non-icteric. EARS, NOSE, MOUTH AND THROAT: The oral mucosa is pink and moist. Hearing is intact to voice. LYMPH NODES:  Lymph nodes in the neck are normal. RESPIRATORY:  Lungs are clear. There is normal respiratory effort, with equal breath sounds bilaterally, and without pathologic use of accessory muscles. CARDIOVASCULAR: Heart is regular without murmurs, gallops, or rubs. GI: The abdomen is  soft, Tender to palpation on the RUQ w positive Murphy sign. There are no palpable masse.There are normal bowel sounds in all quadrants. GU: Rectal deferred.   MUSCULOSKELETAL: Normal muscle strength and tone. No cyanosis or edema.   SKIN: Turgor is good and there are no pathologic skin lesions or ulcers. NEUROLOGIC: Motor and sensation is grossly normal. Cranial nerves are grossly intact. PSYCH:  Oriented to person, place and time. Affect is normal.  Data Reviewed  I have personally reviewed the patient's imaging, laboratory findings and medical records.    Assessment/Plan 46 year old male with cirrhosis and portal hypertension presents with hemorrhagic cholecystitis.  This is typically a surgical emergency.  Currently the patient is hemodynamically stable with a stable hemoglobin but he is tender with  positive Murphy sign.  I am concerned that if we do not do a PROMP operative intervention he may go into hemorrhagic shock.  There is evidence of contrast extravasation on the CT.  An extensive discussion with the patient and the family.  He is a high risk given cirrhosis portal hypertension and Childs B status.  His INR slightly higher today his platelets are reasonable.  I have typed and crossed him for FFP and red blood cells.  Have also alerted the OR for urgency of the case. The risks, benefits, complications, treatment options, and expected outcomes were discussed with the patient. The possibilities of bleeding, recurrent infection, finding a normal gallbladder, perforation of viscus organs, damage to surrounding structures, bile leak, abscess formation, needing a drain placed, the need for additional procedures, reaction to medication, pulmonary aspiration,  failure to diagnose a condition, the possible need  to convert to an open procedure, and creating a complication requiring transfusion or operation were discussed with the patient. The patient and/or family concurred with the proposed plan, giving informed consent.  I have spent greater 75 minutes in this encounter including coordination of his care, personally reviewing imaging studies, performing appropriate documentation and  counseling the patient and his family.  Caroleen Hamman, MD FACS General Surgeon 04/16/2022, 3:37 PM

## 2022-04-16 NOTE — ED Notes (Signed)
Pt to OR.

## 2022-04-16 NOTE — ED Triage Notes (Signed)
PT coming poV from home with [place] upper abdominal pain. PT had colonoscopy yesterday with no little pain. Pt stating this morning that had sudden onset of upper abdominal pain with intermittent relief. Pt endorses nausea. Pt passing gas last night but little to none today, but had a BM this morning- loose consistency, normal color etc.

## 2022-04-16 NOTE — Op Note (Signed)
Robotic assisted laparoscopic Cholecystectomy  Pre-operative Diagnosis: Hemorrhagic cholecystitis  Post-operative Diagnosis: same  Procedure:  Robotic assisted laparoscopic Cholecystectomy  Surgeon: Caroleen Hamman, MD FACS  Anesthesia: Gen. with endotracheal tube  Findings: Severe Hemorrhagic Cholecystitis w massive clot inside the Gallbladder, very friable and oozy liver and tissues due to advanced cirrhosis and portal HTN Needed to transfuse PL and 1 unit RBC due to coagulopathy and oozing.  Estimated Blood Loss: 500cc  Transfusion: 1 unit RBC and 1 Unit PL       Specimens: Gallbladder           Complications: none   Procedure Details  The patient was seen again in the Holding Room. The benefits, complications, treatment options, and expected outcomes were discussed with the patient. The risks of bleeding, infection, recurrence of symptoms, failure to resolve symptoms, bile duct damage, bile duct leak, retained common bile duct stone, bowel injury, any of which could require further surgery and/or ERCP, stent, or papillotomy were reviewed with the patient. The likelihood of improving the patient's symptoms with return to their baseline status is good.  The patient and/or family concurred with the proposed plan, giving informed consent.  The patient was taken to Operating Room, identified  and the procedure verified as Laparoscopic Cholecystectomy.  A Time Out was held and the above information confirmed.  Prior to the induction of general anesthesia, antibiotic prophylaxis was administered. VTE prophylaxis was in place. General endotracheal anesthesia was then administered and tolerated well. After the induction, the abdomen was prepped with Chloraprep and draped in the sterile fashion. The patient was positioned in the supine position.  Cut down technique was used to enter the abdominal cavity infraumbilically and a Hasson trochar was placed after two vicryl stitches were anchored to  the fascia. Pneumoperitoneum was then created with CO2 and tolerated well without any adverse changes in the patient's vital signs.  Three 8-mm ports were placed under direct vision. All skin incisions  were infiltrated with a local anesthetic agent before making the incision and placing the trocars.   The patient was positioned  in reverse Trendelenburg, robot was brought to the surgical field and docked in the standard fashion.  We made sure all the instrumentation was kept indirect view at all times and that there were no collision between the arms. I scrubbed out and went to the console.  The gallbladder was identified, it was found massively distended and inflamed, the fundus grasped and retracted cephalad. Adhesions were lysed bluntly. The infundibulum was grasped and retracted laterally, exposing the peritoneum overlying the triangle of Calot. This was then divided and exposed in a blunt fashion. An extended critical view of the cystic duct and cystic artery was obtained.  The cystic duct was clearly identified and bluntly dissected.   Artery and duct were double clipped and divided. Using ICG cholangiography we visualize the cystic duct and CBD no evidence of bile injuries.  The wall was very friable and ruptured, we Evacuated a massive clot in the gallbladder, operation was difficult due to continuous oozing from liver surface , Very difficult to obtain hemostasis but after very meticulous and careful maneuvering with suction and cautery appropriate hemostasis was achieved. I also placed 10 cc Vista seal to aid with hemostasis and placed a Mini lap and place pressure to improve hemostasis.  The gallbladder was taken from the gallbladder fossa in a retrograde fashion with the electrocautery.  Hemostasis was achieved with the electrocautery. Inspection of the right upper quadrant was  performed. No bleeding, bile duct injury or leak, or bowel injury was noted. Robotic instruments and robotic arms  were undocked in the standard fashion.  I scrubbed back in.  The gallbladder was removed and placed in an Endocatch bag. A 19 FR blake drain was placed in the GB fossa.  Pneumoperitoneum was released.  The periumbilical port site was closed with interrumpted 0 Vicryl sutures. 4-0 subcuticular Monocryl was used to close the skin. Dermabond was  applied.  The patient was then extubated and brought to the recovery room in stable condition. Sponge, lap, and needle counts were correct at closure and at the conclusion of the case.               Caroleen Hamman, MD, FACS

## 2022-04-16 NOTE — Transfer of Care (Signed)
Immediate Anesthesia Transfer of Care Note  Patient: Brian Vasquez  Procedure(s) Performed: XI ROBOTIC ASSISTED LAPAROSCOPIC CHOLECYSTECTOMY (Abdomen) INDOCYANINE GREEN FLUORESCENCE IMAGING (ICG) (Abdomen)  Patient Location: PACU  Anesthesia Type:General  Level of Consciousness: awake, drowsy and patient cooperative  Airway & Oxygen Therapy: Patient Spontanous Breathing and Patient connected to face mask oxygen  Post-op Assessment: Report given to RN and Post -op Vital signs reviewed and stable  Post vital signs: Reviewed and stable  Last Vitals:  Vitals Value Taken Time  BP 90/72 04/16/22 1910  Temp    Pulse 97 04/16/22 1916  Resp 17 04/16/22 1916  SpO2 97 % 04/16/22 1916  Vitals shown include unvalidated device data.  Last Pain:  Vitals:   04/16/22 1626  TempSrc: Temporal  PainSc: 4       Patients Stated Pain Goal: 0 (02/40/97 3532)  Complications: No notable events documented.

## 2022-04-16 NOTE — Assessment & Plan Note (Signed)
Patient has a history of liver cirrhosis secondary to alpha-1 antitrypsin deficiency evidence of portal venous hypertension. Continue spironolactone and Lasix

## 2022-04-16 NOTE — Assessment & Plan Note (Addendum)
Patient presents for evaluation of abdominal pain associated with nausea, vomiting and chills. Imaging shows gallbladder wall thickening with stones and hyperdense intraluminal contents, findings are concerning for hemorrhagic cholecystitis. There is in intraluminal hyperdense focus which increases in size on delayed imaging, concerning for intraluminal active bleeding. We will place patient on IV Zosyn Keep n.p.o. Surgical consult

## 2022-04-16 NOTE — Anesthesia Preprocedure Evaluation (Addendum)
Anesthesia Evaluation  Patient identified by MRN, date of birth, ID band Patient awake    Reviewed: Allergy & Precautions, NPO status , Patient's Chart, lab work & pertinent test results  History of Anesthesia Complications Negative for: history of anesthetic complications  Airway Mallampati: III  TM Distance: >3 FB Neck ROM: full    Dental  (+) Chipped, Dental Advidsory Given, Teeth Intact   Pulmonary neg pulmonary ROS, neg shortness of breath, former smoker,    Pulmonary exam normal        Cardiovascular Exercise Tolerance: Good hypertension, (-) angina(-) Past MI and (-) Cardiac Stents Normal cardiovascular exam(-) dysrhythmias (-) Valvular Problems/Murmurs     Neuro/Psych negative neurological ROS  negative psych ROS   GI/Hepatic negative GI ROS, Neg liver ROS, neg GERD  ,  Endo/Other  negative endocrine ROS  Renal/GU negative Renal ROS  negative genitourinary   Musculoskeletal   Abdominal   Peds  Hematology negative hematology ROS (+)   Anesthesia Other Findings Past Medical History: No date: Alcoholic cirrhosis of liver with ascites ([facility]) No date: Cirrhosis ([place]) No date: Esophageal varices ([facility]) No date: ETOH abuse No date: Hypertension 05/10/2021: Hyponatremia  Past Surgical History: 05/12/2021: ESOPHAGOGASTRODUODENOSCOPY (EGD) WITH PROPOFOL; N/A     Comment:  Procedure: ESOPHAGOGASTRODUODENOSCOPY (EGD) WITH               PROPOFOL;  Surgeon: [name], MD;  Location: [facility]               ENDOSCOPY;  Service: Gastroenterology;  Laterality: N/A; 07/08/2021: ESOPHAGOGASTRODUODENOSCOPY (EGD) WITH PROPOFOL; N/A     Comment:  Procedure: ESOPHAGOGASTRODUODENOSCOPY (EGD) WITH               PROPOFOL;  Surgeon: [name], MD;  Location: [facility]               ENDOSCOPY;  Service: Gastroenterology;  Laterality: N/A; 08/20/2021: ESOPHAGOGASTRODUODENOSCOPY (EGD) WITH PROPOFOL; N/A     Comment:  Procedure:  ESOPHAGOGASTRODUODENOSCOPY (EGD) WITH               PROPOFOL;  Surgeon: [name], MD;  Location: [facility]               ENDOSCOPY;  Service: Gastroenterology;  Laterality: N/A; 02/23/2022: ESOPHAGOGASTRODUODENOSCOPY (EGD) WITH PROPOFOL; N/A     Comment:  Procedure: ESOPHAGOGASTRODUODENOSCOPY (EGD) WITH               PROPOFOL;  Surgeon: [name], MD;  Location: [facility]               ENDOSCOPY;  Service: Gastroenterology;  Laterality: N/A;  BMI    Body Mass Index: 27.98 kg/m      Reproductive/Obstetrics negative OB ROS                             Anesthesia Physical  Anesthesia Plan  ASA: 2 and emergent  Anesthesia Plan: General   Post-op Pain Management:    Induction: Intravenous, Rapid sequence and Cricoid pressure planned  PONV Risk Score and Plan: Ondansetron, Dexamethasone, Midazolam and Treatment may vary due to age or medical condition  Airway Management Planned: Oral ETT  Additional Equipment:   Intra-op Plan:   Post-operative Plan: Extubation in OR  Informed Consent: I have reviewed the patients History and Physical, chart, labs and discussed the procedure including the risks, benefits and alternatives for the proposed anesthesia with the patient or authorized representative who has indicated his/her understanding  and acceptance.     Dental Advisory Given  Plan Discussed with: Anesthesiologist, CRNA and Surgeon  Anesthesia Plan Comments: (Patient consented for risks of anesthesia including but not limited to:  - adverse reactions to medications - risk of airway placement if required - damage to eyes, teeth, lips or other oral mucosa - nerve damage due to positioning  - sore throat or hoarseness - Damage to heart, brain, nerves, lungs, other parts of body or loss of life  Patient voiced understanding.)       Anesthesia Quick Evaluation

## 2022-04-16 NOTE — ED Provider Notes (Signed)
   [facility] Provider Note    Event Date/Time   First MD Initiated Contact with Patient 04/16/22 1135     (approximate)   History   No chief complaint on file.   HPI  Brian Vasquez is a 46 y.o. male  who is s/p colonscopy yesterday coming in with severe pain. Did have 2 polyps that were removed. Mild pain yesterday but went away-- had normal BM yesterday but had intermittent severe abdom pain with vomiting since this AM. No fevers    Physical Exam   Triage Vital Signs: ED Triage Vitals  Enc Vitals Group     BP 04/16/22 1122 (!) 142/99     Pulse Rate 04/16/22 1122 66     Resp 04/16/22 1122 20     Temp 04/16/22 1122 98 F (36.7 C)     Temp src --      SpO2 04/16/22 1122 95 %     Weight 04/16/22 1124 195 lb (88.5 kg)     Height --      Head Circumference --      Peak Flow --      Pain Score 04/16/22 1124 6     Pain Loc --      Pain Edu? --      Excl. in [place]? --     Most recent vital signs: Vitals:   04/16/22 1122  BP: (!) 142/99  Pulse: 66  Resp: 20  Temp: 98 F (36.7 C)  SpO2: 95%     General: Awake, no distress.  CV:  Good peripheral perfusion.  Resp:  Normal effort.  Abd:  No distention. Soft and non tender  Other:     ED Results / Procedures / Treatments   Labs (all labs ordered are listed, but only abnormal results are displayed) Labs Reviewed  CBC WITH DIFFERENTIAL/PLATELET - Abnormal; Notable for the following components:      Result Value   Platelets 130 (*)    All other components within normal limits  COMPREHENSIVE METABOLIC PANEL  LIPASE, BLOOD     EKG  My interpretation of EKG:  Normal sinus rate of 63, no st elevation, no twi, normal intervals   RADIOLOGY I have reviewed the CT personally and interpreted and + GB inflammation    PROCEDURES:  Critical Care performed: No  Procedures   MEDICATIONS ORDERED IN ED: Medications - No data to display   IMPRESSION / MDM / [place] / ED  COURSE  I reviewed the triage vital signs and the nursing notes.   Patient's presentation is most consistent with acute presentation with potential threat to life or bodily function.   Differential: obstruction, perforation  will get CT to evaluate  IV fentanyl and zofran given.   Cbc slightly low plts similar prior LFTs elevated similar to prior  Lipse normal  CT shows hemorraghic cystitis-- d/q Dr. Dahlia Byes recommend [place] and admit to medicine  will most likely need perc drain.   Will give dose of zosyn.     FINAL CLINICAL IMPRESSION(S) / ED DIAGNOSES   Final diagnoses:  Acute hemorrhagic cholecystitis     Rx / DC Orders   ED Discharge Orders     None        Note:  This document was prepared using Dragon voice recognition software and may include unintentional dictation errors.   Vanessa [place], MD 04/16/22 [id number]

## 2022-04-17 ENCOUNTER — Encounter: Payer: Self-pay | Admitting: Internal Medicine

## 2022-04-17 DIAGNOSIS — K8000 Calculus of gallbladder with acute cholecystitis without obstruction: Secondary | ICD-10-CM | POA: Diagnosis not present

## 2022-04-17 LAB — BPAM RBC
Blood Product Expiration Date: 202311142359
Blood Product Expiration Date: 202311142359
ISSUE DATE / TIME: 202310121747
ISSUE DATE / TIME: 202310121747
Unit Type and Rh: 6200
Unit Type and Rh: 6200

## 2022-04-17 LAB — COMPREHENSIVE METABOLIC PANEL
ALT: 75 U/L — ABNORMAL HIGH (ref 0–44)
AST: 113 U/L — ABNORMAL HIGH (ref 15–41)
Albumin: 3.2 g/dL — ABNORMAL LOW (ref 3.5–5.0)
Alkaline Phosphatase: 97 U/L (ref 38–126)
Anion gap: 4 — ABNORMAL LOW (ref 5–15)
BUN: 13 mg/dL (ref 6–20)
CO2: 25 mmol/L (ref 22–32)
Calcium: 8.5 mg/dL — ABNORMAL LOW (ref 8.9–10.3)
Chloride: 106 mmol/L (ref 98–111)
Creatinine, Ser: 0.77 mg/dL (ref 0.61–1.24)
GFR, Estimated: >60 mL/min (ref >60)
Glucose, Bld: 158 mg/dL — ABNORMAL HIGH (ref 70–99)
Potassium: 4.3 mmol/L (ref 3.5–5.1)
Sodium: 135 mmol/L (ref 135–145)
Total Bilirubin: 2.5 mg/dL — ABNORMAL HIGH (ref 0.3–1.2)
Total Protein: 6.1 g/dL — ABNORMAL LOW (ref 6.5–8.1)

## 2022-04-17 LAB — TYPE AND SCREEN
ABO/RH(D): A POS
Antibody Screen: NEGATIVE
Unit division: 0
Unit division: 0

## 2022-04-17 LAB — CBC
HCT: 40.3 % (ref 39.0–52.0)
Hemoglobin: 14.1 g/dL (ref 13.0–17.0)
MCH: 31.1 pg (ref 26.0–34.0)
MCHC: 35 g/dL (ref 30.0–36.0)
MCV: 88.8 fL (ref 80.0–100.0)
Platelets: 123 10*3/uL — ABNORMAL LOW (ref 150–400)
RBC: 4.54 MIL/uL (ref 4.22–5.81)
RDW: 14 % (ref 11.5–15.5)
WBC: 18.4 10*3/uL — ABNORMAL HIGH (ref 4.0–10.5)
nRBC: 0 % (ref 0.0–0.2)

## 2022-04-17 LAB — PREPARE PLATELET PHERESIS: Unit division: 0

## 2022-04-17 LAB — BPAM PLATELET PHERESIS
Blood Product Expiration Date: 202310162359
ISSUE DATE / TIME: 202310121922
Unit Type and Rh: 6200

## 2022-04-17 MED ORDER — INFLUENZA VAC SPLIT QUAD 0.5 ML IM SUSY
0.5000 mL | PREFILLED_SYRINGE | INTRAMUSCULAR | Status: AC
  Administered 2022-04-20: 0.5 mL via INTRAMUSCULAR
  Filled 2022-04-17: qty 0.5

## 2022-04-17 MED ORDER — FENTANYL CITRATE PF 50 MCG/ML IJ SOSY
50.0000 ug | PREFILLED_SYRINGE | Freq: Once | INTRAMUSCULAR | Status: AC
  Administered 2022-04-17: 50 ug via INTRAVENOUS
  Filled 2022-04-17: qty 1

## 2022-04-17 MED ORDER — SIMETHICONE 80 MG PO CHEW
160.0000 mg | CHEWABLE_TABLET | Freq: Once | ORAL | Status: AC
  Administered 2022-04-17: 160 mg via ORAL
  Filled 2022-04-17: qty 2

## 2022-04-17 MED ORDER — PREGABALIN 50 MG PO CAPS
100.0000 mg | ORAL_CAPSULE | Freq: Three times a day (TID) | ORAL | Status: DC
  Administered 2022-04-17 – 2022-04-20 (×10): 100 mg via ORAL
  Filled 2022-04-17 (×10): qty 2

## 2022-04-17 NOTE — Anesthesia Postprocedure Evaluation (Signed)
Anesthesia Post Note  Patient: Brian Vasquez  Procedure(s) Performed: XI ROBOTIC ASSISTED LAPAROSCOPIC CHOLECYSTECTOMY (Abdomen) INDOCYANINE GREEN FLUORESCENCE IMAGING (ICG) (Abdomen)  Patient location during evaluation: PACU Anesthesia Type: General Level of consciousness: awake and alert Pain management: pain level controlled Vital Signs Assessment: post-procedure vital signs reviewed and stable Respiratory status: spontaneous breathing, nonlabored ventilation, respiratory function stable and patient connected to nasal cannula oxygen Cardiovascular status: blood pressure returned to baseline and stable Postop Assessment: no apparent nausea or vomiting Anesthetic complications: no   No notable events documented.   Last Vitals:  Vitals:   04/16/22 2043 04/16/22 2242  BP: (!) 147/98 (!) 143/99  Pulse: 93 92  Resp: 19 20  Temp:  36.6 C  SpO2: 93% 94%    Last Pain:  Vitals:   04/16/22 2217  TempSrc:   PainSc: 8                  Martha Clan

## 2022-04-17 NOTE — Progress Notes (Signed)
PROGRESS NOTE  Brian Vasquez  DOB: 22-Apr-1976  PCP: Wayland Denis, PA-C EGB:[id number]  DOA: 04/16/2022  LOS: 1 day  Hospital Day: 2  Brief narrative: Brian Vasquez is a 46 y.o. male with PMH significant for  remote history of alcohol use, liver cirrhosis primarily d/t alpha-1 antitrypsin deficiency, portal hypertension, esophageal varices; hypertension, hyponatremia 10/11, patient underwent elective colonoscopy.  He had 2 polyps removed and was discharged home on stable condition.  Post discharge, patient developed abdominal pain in the right upper quadrant with radiation across intra-abdominal wall.  Pain persisted overnight and hence patient presented to the ED next morning on 10/12.   CT scan of abdomen and pelvis showed gallbladder wall thickening with stones and hyperdense intraluminal contents, findings concerning for hemorrhagic cholecystitis.  There is also concern of intraluminal active bleeding.   Surgery was consulted Admitted to hospitalist service Patient underwent robotic assisted laparoscopic cholecystectomy.   Subjective: Patient was seen and examined this afternoon.  Patient middle-aged Caucasian male.  Lying down in bed.  Abdominal pain partially controlled.  Has not passed gas yet. Significant other at bedside. Chart reviewed In the last 24 hours, patient had no fever, heart rate in 90s, blood pressure in 140s, breathing on 2 L oxygen nasal cannula. Last set of labs from this morning with WBC count elevated to 18.4, hemoglobin at 14.9  Assessment and plan: Acute cholecystitis due to biliary calculus s/p lap chole -10/12 Postoperative ileus Patient presented for evaluation of abdominal pain nausea, vomiting Imaging finding as above concerning for cholecystitis as well as intraluminal bleeding in the gallbladder. 10/12, underwent lap chole.  Noted to have severe hemorrhagic cholecystitis with massive clot inside the gallbladder. Currently postop ileus.  General  surgery following.  Awaiting bowel function Continue IV Zosyn, clear liquid diet, pain control Monitor LFTs.  Per general surgery, if bilirubin continues to climb up, patient may need HIDA scan to ensure no leakage. Recent Labs  Lab 04/16/22 1130 04/16/22 1323 04/16/22 2220 04/17/22 0556  AST 55*  --   --  113*  ALT 49*  --   --  75*  ALKPHOS 125  --   --  97  BILITOT 1.8*  --   --  2.5*  PROT 7.4  --   --  6.1*  ALBUMIN 3.7  --   --  3.2*  INR  --  1.4* 1.4*  --   LIPASE 42  --   --   --   PLT 130*  --  119* 123*   Liver cirrhosis  Portal venous hypertension Patient has a history of liver cirrhosis secondary to alpha-1 antitrypsin deficiency.  Also has evidence of portal venous hypertension and history of esophageal varices PTA on Lasix 20 mg daily, eplerenone 25 mg daily Keep both on hold at this time.  Low platelets Currently running low because of liver cirrhosis.  Per operative note, patient needed transfusion of 1 unit of platelet and 1 unit PRBC Intra-Op due to coagulopathy and oozing. Recent Labs  Lab 04/16/22 1130 04/16/22 2220 04/17/22 0556  WBC 8.0 19.0* 18.4*  NEUTROABS 4.6  --   --   HGB 15.7 15.3 14.1  HCT 46.1 43.7 40.3  MCV 90.2 88.1 88.8  PLT 130* 119* 123*    Goals of care   Code Status: Full Code    Mobility:   Skin assessment:     Nutritional status:  Body mass index is 27.99 kg/m.  Diet:  Diet Order             Diet clear liquid Room service appropriate? Yes; Fluid consistency: Thin  Diet effective now                   DVT prophylaxis:  SCDs Start: 04/16/22 1514   Antimicrobials: IV Zosyn Fluid: None Consultants: General surgery Family Communication: Significant other at bedside  Status is: Inpatient  Continue in-hospital care because: Postoperative ileus currently Level of care: Med-Surg   Dispo: The patient is from: Home              Anticipated d/c is to: Pending clinical course              Patient  currently is not medically stable to d/c.   Difficult to place patient No     Infusions:   piperacillin-tazobactam (ZOSYN)  IV 3.375 g (04/17/22 1437)    Scheduled Meds:  sodium chloride   Intravenous Once   Chlorhexidine Gluconate Cloth  6 each Topical M0867   folic acid  1 mg Oral Daily   [START ON 04/18/2022] influenza vac split quadrivalent PF  0.5 mL Intramuscular Tomorrow-1000   pregabalin  100 mg Oral TID   thiamine  100 mg Oral Daily    PRN meds: morphine injection, ondansetron **OR** ondansetron (ZOFRAN) IV, oxyCODONE   Antimicrobials: Anti-infectives (From admission, onward)    Start     Dose/Rate Route Frequency Ordered Stop   04/16/22 2200  piperacillin-tazobactam (ZOSYN) IVPB 3.375 g        3.375 g 12.5 mL/hr over 240 Minutes Intravenous Every 8 hours 04/16/22 1526     04/16/22 1330  piperacillin-tazobactam (ZOSYN) IVPB 3.375 g        3.375 g 100 mL/hr over 30 Minutes Intravenous  Once 04/16/22 1322 04/16/22 1446       Objective: Vitals:   04/17/22 0759 04/17/22 1517  BP: 109/80 131/79  Pulse: 90 94  Resp: 16 14  Temp: 99.3 F (37.4 C) 99.4 F (37.4 C)  SpO2: (!) 89% 91%    Intake/Output Summary (Last 24 hours) at 04/17/2022 1611 Last data filed at 04/17/2022 1518 Gross per 24 hour  Intake 1444.31 ml  Output 2805 ml  Net -1360.69 ml   Filed Weights   04/16/22 1124 04/16/22 1626  Weight: 88.5 kg 88.5 kg   Weight change:  Body mass index is 27.99 kg/m.   Physical Exam: General exam: Pleasant, middle-aged Caucasian male. Skin: No rashes, lesions or ulcers. HEENT: Atraumatic, normocephalic, no obvious bleeding Lungs: Clear to auscultation bilaterally CVS: Regular rate and rhythm, no murmur GI/Abd soft, mild appropriate postop tenderness, bowel sounds not heard CNS: Alert, awake, oriented x3 Psychiatry: Mood appropriate Extremities: No pedal edema, no calf tenderness  Data Review: I have personally reviewed the laboratory data and  studies available.  F/u labs ordered Unresulted Labs (From admission, onward)     Start     Ordered   04/18/22 0500  CBC  Tomorrow morning,   R       Question:  Specimen collection method  Answer:  Lab=Lab collect   04/17/22 0951   04/18/22 0500  Comprehensive metabolic panel  Tomorrow morning,   R       Question:  Specimen collection method  Answer:  Lab=Lab collect   04/17/22 0951            Signed, Terrilee Croak, MD [facility] 04/17/2022

## 2022-04-17 NOTE — Plan of Care (Signed)
  Problem: Education: Goal: Knowledge of General Education information will improve Description: Including pain rating scale, medication(s)/side effects and non-pharmacologic comfort measures Outcome: Progressing   Problem: Health Behavior/Discharge Planning: Goal: Ability to manage health-related needs will improve Outcome: Progressing   Problem: Clinical Measurements: Goal: Ability to maintain clinical measurements within normal limits will improve Outcome: Progressing   Problem: Activity: Goal: Risk for activity intolerance will decrease Outcome: Progressing   Problem: Nutrition: Goal: Adequate nutrition will be maintained Outcome: Progressing   Problem: Coping: Goal: Level of anxiety will decrease Outcome: Progressing   Problem: Elimination: Goal: Will not experience complications related to urinary retention Outcome: Progressing   Problem: Pain Managment: Goal: General experience of comfort will improve Outcome: Progressing   Problem: Safety: Goal: Ability to remain free from injury will improve Outcome: Progressing   Problem: Skin Integrity: Goal: Risk for impaired skin integrity will decrease Outcome: Progressing   

## 2022-04-17 NOTE — Consult Note (Signed)
Pharmacy Antibiotic Note  Brian Vasquez is a 46 y.o. male admitted on 04/16/2022 with  acute cholecystitis .  Pharmacy has been consulted for Zosyn dosing.  Assessment: 46 yo M with PMH liver cirrhosis secondary to alpha-1 antitrypsin deficiency with portal hypertension, remote history of alcohol abuse presents with abdominal pain following a colonoscopy (2 polyps removed) on 10/11. Was stable after coloscopy but now pain radiating across right upper quadrant after dinner last night. CTAP shows findings c/f hemorrhagic cholecystitis and possible active bleeding. Hgb currently stable at 15.7, and pt is afebrile and VSS with no leukocytosis. No cultures collected yet. Pt received Zosyn 3.375 g x 1 in the ED. Laparoscopic cholecystectomy planned for today. Renal function stable.  Plan: Continue Zosyn 3.375 g IV q8H Monitor renal function to assess for any necessary changes in dosing regimen Follow up culture results (if applicable) to assess for opportunities to narrow therapy  Height: '5\' 10"'$  (177.8 cm) Weight: 88.5 kg (195 lb 1.7 oz) IBW/kg (Calculated) : 73  Temp (24hrs), Avg:98.9 F (37.2 C), Min:97.8 F (36.6 C), Max:99.7 F (37.6 C)  Recent Labs  Lab 04/16/22 1130 04/16/22 2220 04/17/22 0556  WBC 8.0 19.0* 18.4*  CREATININE 0.78  --  0.77     Estimated Creatinine Clearance: 127.9 mL/min (by C-G formula based on SCr of 0.77 mg/dL).    Not on File  Antimicrobials this admission: Zosyn 10/12 >>   Dose adjustments this admission: N/A  Microbiology results: N/A  Thank you for allowing pharmacy to be a part of this patient's care.  Aubery Lapping, PharmD 04/17/2022 10:15 AM

## 2022-04-17 NOTE — Progress Notes (Signed)
[facility] [facility] Hospital Day(s): 1.   Post op day(s): 1 Day Post-Op.   Interval History:  Patient seen and examined No acute events or new complaints overnight.  Patient reports he had a episode of upper abdominal pain last night; better this morning No nausea, emesis Leukocytosis to 18.4K (from 19.0K); likely reactive from OR Hgb 14.1 Thrombocytopenia to 123K Renal function normal; sCr - 0.77; UO - 825 ccs Hyperbilirubinemia to 2.5; does have history of cirrhosis Surgical drain with 290 ccs out; serosanuingous. Anticipate this being high given surgery and cirrhosis He is on CLD He continues on Zosyn   Vital signs in last 24 hours: [min-max] current  Temp:  [97.8 F (36.6 C)-99.7 F (37.6 C)] 99.3 F (37.4 C) (10/13 0759) Pulse Rate:  [64-102] 90 (10/13 0759) Resp:  [15-32] 16 (10/13 0759) BP: (90-160)/(72-99) 109/80 (10/13 0759) SpO2:  [89 %-100 %] 89 % (10/13 0759) Weight:  [88.5 kg] 88.5 kg (10/12 1626)     Height: '5\' 10"'$  (177.8 cm) Weight: 88.5 kg BMI (Calculated): 27.99   Intake/Output last 2 shifts:  10/12 0701 - 10/13 0700 In: 1494.3 [P.O.:360; I.V.:350; Blood:694; IV Piggyback:90.3] Out: 5809 [Urine:825; Drains:290; Blood:500]   Physical Exam:  Constitutional: alert, cooperative and no distress  Respiratory: breathing non-labored at rest  Cardiovascular: regular rate and sinus rhythm  Gastrointestinal: soft, generalized soreness, non-distended, no rebound/guarding. Surgical drain with serosanguinous output Integumentary: Laparoscopic incisions are CDI with dermabond, no erythema or drainage   Labs:     Latest Ref Rng & Units 04/17/2022    5:56 AM 04/16/2022   10:20 PM 04/16/2022   11:30 AM  CBC  WBC 4.0 - 10.5 K/uL 18.4  19.0  8.0   Hemoglobin 13.0 - 17.0 g/dL 14.1  15.3  15.7   Hematocrit 39.0 - 52.0 % 40.3  43.7  46.1   Platelets 150 - 400 K/uL 123  119  130       Latest Ref Rng & Units 04/17/2022    5:56 AM  04/16/2022   11:30 AM 02/27/2022    3:30 PM  CMP  Glucose 70 - 99 mg/dL 158  110  129   BUN 6 - 20 mg/dL '13  13  12   '$ Creatinine 0.61 - 1.24 mg/dL 0.77  0.78  0.98   Sodium 135 - 145 mmol/L 135  137  136   Potassium 3.5 - 5.1 mmol/L 4.3  4.5  3.8   Chloride 98 - 111 mmol/L 106  106  98   CO2 22 - 32 mmol/L '25  26  25   '$ Calcium 8.9 - 10.3 mg/dL 8.5  9.4  9.1   Total Protein 6.5 - 8.1 g/dL 6.1  7.4  6.7   Total Bilirubin 0.3 - 1.2 mg/dL 2.5  1.8  1.3   Alkaline Phos 38 - 126 U/L 97  125  155   AST 15 - 41 U/L 113  55  49   ALT 0 - 44 U/L 75  49  49      Imaging studies: No new pertinent imaging studies   Assessment/Plan:  46 y.o. male 1 Day Post-Op s/p robotic assisted laparoscopic cholecystectomy for hemorrhagic cholecystitis, complicated by cirrhosis   - Continue CLD - Continue IVF support - Continue IV Abx (Zosyn) - Continue surgical drain; monitor and record output. Unfortunately, with his cirrhosis history, copious irrigation intra-operatively, and oozing this will likely have significant output.     - Monitor abdominal  examination; on-going pain control  - Pain control prn; adjust regimen   - Monitor leukocytosis; likely reactive - Monitor bilirubin; difficult to interpret increase today given liver disease and recent procedure. If continues to climb may need HIDA to ensure no leak - Mobilize when feasible   - Further management per primary service; we will follow    All of the above findings and recommendations were discussed with the patient, patient's family, and the medical team, and all of patient's and family's questions were answered to their expressed satisfaction.  -- Edison Simon, PA-C [facility] 04/17/2022, 9:15 AM M-F: 7am - 4pm

## 2022-04-18 ENCOUNTER — Encounter: Payer: Self-pay | Admitting: Gastroenterology

## 2022-04-18 DIAGNOSIS — K8000 Calculus of gallbladder with acute cholecystitis without obstruction: Secondary | ICD-10-CM | POA: Diagnosis not present

## 2022-04-18 LAB — COMPREHENSIVE METABOLIC PANEL
ALT: 74 U/L — ABNORMAL HIGH (ref 0–44)
AST: 86 U/L — ABNORMAL HIGH (ref 15–41)
Albumin: 3.1 g/dL — ABNORMAL LOW (ref 3.5–5.0)
Alkaline Phosphatase: 98 U/L (ref 38–126)
Anion gap: 6 (ref 5–15)
BUN: 14 mg/dL (ref 6–20)
CO2: 27 mmol/L (ref 22–32)
Calcium: 8.1 mg/dL — ABNORMAL LOW (ref 8.9–10.3)
Chloride: 100 mmol/L (ref 98–111)
Creatinine, Ser: 0.75 mg/dL (ref 0.61–1.24)
GFR, Estimated: >60 mL/min (ref >60)
Glucose, Bld: 119 mg/dL — ABNORMAL HIGH (ref 70–99)
Potassium: 4 mmol/L (ref 3.5–5.1)
Sodium: 133 mmol/L — ABNORMAL LOW (ref 135–145)
Total Bilirubin: 1.9 mg/dL — ABNORMAL HIGH (ref 0.3–1.2)
Total Protein: 6.2 g/dL — ABNORMAL LOW (ref 6.5–8.1)

## 2022-04-18 LAB — CBC
HCT: 39.8 % (ref 39.0–52.0)
Hemoglobin: 13.9 g/dL (ref 13.0–17.0)
MCH: 31.2 pg (ref 26.0–34.0)
MCHC: 34.9 g/dL (ref 30.0–36.0)
MCV: 89.4 fL (ref 80.0–100.0)
Platelets: 112 10*3/uL — ABNORMAL LOW (ref 150–400)
RBC: 4.45 MIL/uL (ref 4.22–5.81)
RDW: 14.4 % (ref 11.5–15.5)
WBC: 23.3 10*3/uL — ABNORMAL HIGH (ref 4.0–10.5)
nRBC: 0 % (ref 0.0–0.2)

## 2022-04-18 LAB — PROTIME-INR
INR: 1.6 — ABNORMAL HIGH (ref 0.8–1.2)
Prothrombin Time: 18.5 seconds — ABNORMAL HIGH (ref 11.4–15.2)

## 2022-04-18 LAB — GLUCOSE, CAPILLARY: Glucose-Capillary: 145 mg/dL — ABNORMAL HIGH (ref 70–99)

## 2022-04-18 LAB — HEMOGLOBIN AND HEMATOCRIT, BLOOD
HCT: 39.3 % (ref 39.0–52.0)
Hemoglobin: 13.7 g/dL (ref 13.0–17.0)

## 2022-04-18 MED ORDER — PANTOPRAZOLE SODIUM 40 MG IV SOLR
40.0000 mg | INTRAVENOUS | Status: DC
  Administered 2022-04-18 – 2022-04-19 (×2): 40 mg via INTRAVENOUS
  Filled 2022-04-18 (×2): qty 10

## 2022-04-18 MED ORDER — ALUM & MAG HYDROXIDE-SIMETH 200-200-20 MG/5ML PO SUSP: 30.0000 mL | ORAL | Status: DC | PRN

## 2022-04-18 MED ORDER — SODIUM CHLORIDE 0.9% IV SOLUTION: Freq: Once | INTRAVENOUS | Status: AC

## 2022-04-18 MED ORDER — FENTANYL CITRATE PF 50 MCG/ML IJ SOSY
50.0000 ug | PREFILLED_SYRINGE | Freq: Once | INTRAMUSCULAR | Status: AC
  Administered 2022-04-18: 50 ug via INTRAVENOUS
  Filled 2022-04-18: qty 1

## 2022-04-18 NOTE — Progress Notes (Signed)
Patient is alert  and oriented x4. Complained of constant pain last night and received pain meds regularly. During a window where he had exhausted his pain meds, he called out to the nurse's station crying and saying  he was in pain. Walked into room and say patient writhing in bed. Contacted provider Sharion Settler and received an order for Fentanyl x1. The fentanyl dose helped patient's pain. Gave him oxy-the last dose of the shift around 0530 for pain. Patient's JP drain has put out around 272ms of bloody drainage. Encouraged patient to ambulate more and that the pains could be more associated with gas since I could hear air movement within his abdomen. Patient agreed to ambulate more after pain is controlled. Denied additional needs.

## 2022-04-18 NOTE — Progress Notes (Signed)
Subjective:  CC: Brian Vasquez is a 46 y.o. male  Hospital stay day 2, 2 Days Post-Op  s/p robotic assisted laparoscopic cholecystectomy for hemorrhagic cholecystitis, complicated by cirrhosis  HPI: Reexamination at this time shows 300 mL of recorded output from [place].  Patient states the pain is essentially unchanged from this morning but controlled with pain medication.  He has been tolerating clear liquid diet when was able to ambulate without any issues.  He believes that output has become thinner compared to this morning.   ROS:  General: Denies weight loss, weight gain, fatigue, fevers, chills, and night sweats. Heart: Denies chest pain, palpitations, racing heart, irregular heartbeat, leg pain or swelling, and decreased activity tolerance. Respiratory: Denies breathing difficulty, shortness of breath, wheezing, cough, and sputum. GI: Denies change in appetite, heartburn, nausea, vomiting, constipation, diarrhea, and blood in stool. GU: Denies difficulty urinating, pain with urinating, urgency, frequency, blood in urine.   Objective:   Temp:  [98.2 F (36.8 C)-98.9 F (37.2 C)] 98.2 F (36.8 C) (10/14 2106) Pulse Rate:  [81-89] 83 (10/14 2106) Resp:  [16-20] 20 (10/14 2106) BP: (125-138)/(82-87) 138/87 (10/14 2106) SpO2:  [90 %-94 %] 92 % (10/14 2106)     Height: '5\' 10"'$  (177.8 cm) Weight: 88.5 kg BMI (Calculated): 27.99   Intake/Output this shift:   Intake/Output Summary (Last 24 hours) at 04/18/2022 2120 Last data filed at 04/18/2022 1545 Gross per 24 hour  Intake 484.17 ml  Output 1860 ml  Net -1375.83 ml    Constitutional :  alert, cooperative, appears stated age, and no distress  Respiratory:  clear to auscultation bilaterally  Cardiovascular:  regular rate and rhythm  Gastrointestinal: Soft, no guarding, focal tenderness to palpation around JP site in right upper quadrant again.  Unchanged from this morning.  JP with bloody output, more serosanguineous than this  morning. .   Skin: Cool and moist.   Psychiatric: Normal affect, non-agitated, not confused       LABS:     Latest Ref Rng & Units 04/18/2022    7:17 AM 04/17/2022    5:56 AM 04/16/2022   11:30 AM  CMP  Glucose 70 - 99 mg/dL 119  158  110   BUN 6 - 20 mg/dL '14  13  13   '$ Creatinine 0.61 - 1.24 mg/dL 0.75  0.77  0.78   Sodium 135 - 145 mmol/L 133  135  137   Potassium 3.5 - 5.1 mmol/L 4.0  4.3  4.5   Chloride 98 - 111 mmol/L 100  106  106   CO2 22 - 32 mmol/L '27  25  26   '$ Calcium 8.9 - 10.3 mg/dL 8.1  8.5  9.4   Total Protein 6.5 - 8.1 g/dL 6.2  6.1  7.4   Total Bilirubin 0.3 - 1.2 mg/dL 1.9  2.5  1.8   Alkaline Phos 38 - 126 U/L 98  97  125   AST 15 - 41 U/L 86  113  55   ALT 0 - 44 U/L 74  75  49       Latest Ref Rng & Units 04/18/2022   11:46 AM 04/18/2022    7:17 AM 04/17/2022    5:56 AM  CBC  WBC 4.0 - 10.5 K/uL  23.3  18.4   Hemoglobin 13.0 - 17.0 g/dL 13.7  13.9  14.1   Hematocrit 39.0 - 52.0 % 39.3  39.8  40.3   Platelets 150 - 400 K/uL  112  70     RADS: N/a Assessment:    s/p robotic assisted laparoscopic cholecystectomy for hemorrhagic cholecystitis, complicated by cirrhosis.  Hemoglobin levels have continued to remain stable despite the large output in JP.  JP output fortunately seems to be more serosanguineous now compared to what it was this morning.  Hope is the output continues to be remanence from his initial episode.  He is status post FFP transfusion as well.  Continue clears for another day.  Recheck labs in the morning, close monitoring continued in the meantime.  labs/images/medications/previous chart entries reviewed personally and relevant changes/updates noted above, total encounter lasting 20 minutes including chart and lab review, face-to-face encounter.

## 2022-04-18 NOTE — Progress Notes (Signed)
PROGRESS NOTE  Brian Vasquez  DOB: 11-15-1975  PCP: Wayland Denis, PA-C [id number]  DOA: 04/16/2022  LOS: 2 days  Hospital Day: 3  Brief narrative: Brian Vasquez is a 46 y.o. male with PMH significant for  remote history of alcohol use, liver cirrhosis primarily d/t alpha-1 antitrypsin deficiency, portal hypertension, esophageal varices; hypertension, hyponatremia 10/11, patient underwent elective colonoscopy.  He had 2 polyps removed and was discharged home on stable condition.  Post discharge, patient developed abdominal pain in the right upper quadrant with radiation across intra-abdominal wall.  Pain persisted overnight and hence patient presented to the ED next morning on 10/12.   CT scan of abdomen and pelvis showed gallbladder wall thickening with stones and hyperdense intraluminal contents, findings concerning for hemorrhagic cholecystitis.  There is also concern of intraluminal active bleeding.   Surgery was consulted Admitted to hospitalist service Patient underwent robotic assisted laparoscopic cholecystectomy.   Subjective: Patient was seen and examined this afternoon.   Lying on bed.  Had acute episode of pain exacerbation last night requiring fentanyl IV.  Pain more controlled this morning.   Noted continued bloody output from JP drain.   Reports passing bowel gas but no bowel movement yet. Discussed with surgeon Dr. Lysle Pearl this afternoon.  I ordered for a unit of FFP.  Assessment and plan: Acute cholecystitis due to biliary calculus s/p lap chole -10/12 Postoperative ileus Patient presented for evaluation of abdominal pain nausea, vomiting Imaging finding as above concerning for cholecystitis as well as intraluminal bleeding in the gallbladder. 10/12, underwent lap chole.  Noted to have severe hemorrhagic cholecystitis with massive clot inside the gallbladder. Currently postop ileus.  General surgery following.  Awaiting bowel function.  Passing gas.  No bowel  movement yet.  Currently on clear liquid diet.  Continue IV Zosyn, clear liquid diet, pain control Monitor LFTs.  Per general surgery, if bilirubin continues to climb up, patient may need HIDA scan to ensure no leakage. Recent Labs  Lab 04/16/22 1130 04/16/22 1323 04/16/22 2220 04/17/22 0556 04/18/22 0717 04/18/22 1146  AST 55*  --   --  113* 86*  --   ALT 49*  --   --  75* 74*  --   ALKPHOS 125  --   --  97 98  --   BILITOT 1.8*  --   --  2.5* 1.9*  --   PROT 7.4  --   --  6.1* 6.2*  --   ALBUMIN 3.7  --   --  3.2* 3.1*  --   INR  --  1.4* 1.4*  --   --  1.6*  LIPASE 42  --   --   --   --   --   PLT 130*  --  119* 123* 112*  --    Liver cirrhosis  Portal venous hypertension Patient has a history of liver cirrhosis secondary to alpha-1 antitrypsin deficiency.  Also has evidence of portal venous hypertension and history of esophageal varices PTA on Lasix 20 mg daily, eplerenone 25 mg daily Currently both are on hold.  Low platelets Currently running low because of liver cirrhosis.  Per operative note, patient needed transfusion of 1 unit of platelet and 1 unit PRBC Intra-Op due to coagulopathy and oozing. Because of high-volume bloody output from JP drain, will give a unit of FFP this afternoon. Recent Labs  Lab 04/16/22 1130 04/16/22 2220 04/17/22 0556 04/18/22 0717 04/18/22 1146  WBC 8.0 19.0* 18.4* 23.3*  --  NEUTROABS 4.6  --   --   --   --   HGB 15.7 15.3 14.1 13.9 13.7  HCT 46.1 43.7 40.3 39.8 39.3  MCV 90.2 88.1 88.8 89.4  --   PLT 130* 119* 123* 112*  --     Goals of care   Code Status: Full Code    Mobility:   Skin assessment:     Nutritional status:  Body mass index is 27.99 kg/m.          Diet:  Diet Order             Diet clear liquid Room service appropriate? Yes; Fluid consistency: Thin  Diet effective now                   DVT prophylaxis:  SCDs Start: 04/16/22 1514   Antimicrobials: IV Zosyn Fluid: None Consultants:  General surgery Family Communication: Significant other at bedside  Status is: Inpatient  Continue in-hospital care because: Postoperative ileus, gradually improving Level of care: Med-Surg   Dispo: The patient is from: Home              Anticipated d/c is to: Pending clinical course              Patient currently is not medically stable to d/c.   Difficult to place patient No     Infusions:   piperacillin-tazobactam (ZOSYN)  IV 3.375 g (04/18/22 0530)    Scheduled Meds:  sodium chloride   Intravenous Once   sodium chloride   Intravenous Once   Chlorhexidine Gluconate Cloth  6 each Topical A1937   folic acid  1 mg Oral Daily   influenza vac split quadrivalent PF  0.5 mL Intramuscular Tomorrow-1000   pantoprazole (PROTONIX) IV  40 mg Intravenous Q24H   pregabalin  100 mg Oral TID   thiamine  100 mg Oral Daily    PRN meds: morphine injection, ondansetron **OR** ondansetron (ZOFRAN) IV, oxyCODONE   Antimicrobials: Anti-infectives (From admission, onward)    Start     Dose/Rate Route Frequency Ordered Stop   04/16/22 2200  piperacillin-tazobactam (ZOSYN) IVPB 3.375 g        3.375 g 12.5 mL/hr over 240 Minutes Intravenous Every 8 hours 04/16/22 1526     04/16/22 1330  piperacillin-tazobactam (ZOSYN) IVPB 3.375 g        3.375 g 100 mL/hr over 30 Minutes Intravenous  Once 04/16/22 1322 04/16/22 1446       Objective: Vitals:   04/18/22 0819 04/18/22 1331  BP: 130/85 125/82  Pulse: 84 83  Resp: 16 20  Temp: 98.2 F (36.8 C) 98.8 F (37.1 C)  SpO2: 92% 93%    Intake/Output Summary (Last 24 hours) at 04/18/2022 1335 Last data filed at 04/18/2022 1307 Gross per 24 hour  Intake 720 ml  Output 2775 ml  Net -2055 ml   Filed Weights   04/16/22 1124 04/16/22 1626  Weight: 88.5 kg 88.5 kg   Weight change:  Body mass index is 27.99 kg/m.   Physical Exam: General exam: Pleasant, middle-aged Caucasian male. Skin: No rashes, lesions or ulcers. HEENT:  Atraumatic, normocephalic, no obvious bleeding Lungs: Clear to auscultation bilaterally CVS: Regular rate and rhythm, no murmur GI/Abd soft, mild appropriate postop tenderness, bowel sounds sluggish.  JP drain with bloody output CNS: Alert, awake, oriented x3 Psychiatry: Mood appropriate Extremities: No pedal edema, no calf tenderness  Data Review: I have personally reviewed the laboratory data and studies available.  F/u labs ordered Unresulted Labs (From admission, onward)    None       Signed, Terrilee Croak, MD [facility] 04/18/2022

## 2022-04-18 NOTE — Progress Notes (Signed)
Subjective:  CC: Brian Vasquez is a 46 y.o. male  Hospital stay day 2, 2 Days Post-Op  s/p robotic assisted laparoscopic cholecystectomy for hemorrhagic cholecystitis, complicated by cirrhosis  HPI: Acute episode of pain exacerbation overnight controlled with fentanyl.  Patient states pain is more controlled this AM.  Able to sit up.  Does note continued bloody output from the JP drain.  Tolerating clears.  ROS:  General: Denies weight loss, weight gain, fatigue, fevers, chills, and night sweats. Heart: Denies chest pain, palpitations, racing heart, irregular heartbeat, leg pain or swelling, and decreased activity tolerance. Respiratory: Denies breathing difficulty, shortness of breath, wheezing, cough, and sputum. GI: Denies change in appetite, heartburn, nausea, vomiting, constipation, diarrhea, and blood in stool. GU: Denies difficulty urinating, pain with urinating, urgency, frequency, blood in urine.   Objective:   Temp:  [98.2 F (36.8 C)-99.4 F (37.4 C)] 98.2 F (36.8 C) (10/14 0819) Pulse Rate:  [84-96] 84 (10/14 0819) Resp:  [14-18] 16 (10/14 0819) BP: (123-131)/(79-86) 130/85 (10/14 0819) SpO2:  [90 %-92 %] 92 % (10/14 0819)     Height: '5\' 10"'$  (177.8 cm) Weight: 88.5 kg BMI (Calculated): 27.99   Intake/Output this shift:   Intake/Output Summary (Last 24 hours) at 04/18/2022 1223 Last data filed at 04/18/2022 1114 Gross per 24 hour  Intake 720 ml  Output 2765 ml  Net -2045 ml    Constitutional :  alert, cooperative, appears stated age, and no distress  Respiratory:  clear to auscultation bilaterally  Cardiovascular:  regular rate and rhythm  Gastrointestinal: Soft, no guarding, focal tenderness to palpation around JP site in right upper quadrant.  JP with bloody output, more sanguinous than serous. .   Skin: Cool and moist.   Psychiatric: Normal affect, non-agitated, not confused       LABS:     Latest Ref Rng & Units 04/18/2022    7:17 AM 04/17/2022     5:56 AM 04/16/2022   11:30 AM  CMP  Glucose 70 - 99 mg/dL 119  158  110   BUN 6 - 20 mg/dL '14  13  13   '$ Creatinine 0.61 - 1.24 mg/dL 0.75  0.77  0.78   Sodium 135 - 145 mmol/L 133  135  137   Potassium 3.5 - 5.1 mmol/L 4.0  4.3  4.5   Chloride 98 - 111 mmol/L 100  106  106   CO2 22 - 32 mmol/L '27  25  26   '$ Calcium 8.9 - 10.3 mg/dL 8.1  8.5  9.4   Total Protein 6.5 - 8.1 g/dL 6.2  6.1  7.4   Total Bilirubin 0.3 - 1.2 mg/dL 1.9  2.5  1.8   Alkaline Phos 38 - 126 U/L 98  97  125   AST 15 - 41 U/L 86  113  55   ALT 0 - 44 U/L 74  75  49       Latest Ref Rng & Units 04/18/2022   11:46 AM 04/18/2022    7:17 AM 04/17/2022    5:56 AM  CBC  WBC 4.0 - 10.5 K/uL  23.3  18.4   Hemoglobin 13.0 - 17.0 g/dL 13.7  13.9  14.1   Hematocrit 39.0 - 52.0 % 39.3  39.8  40.3   Platelets 150 - 400 K/uL  112  123     RADS: N/a Assessment:    s/p robotic assisted laparoscopic cholecystectomy for hemorrhagic cholecystitis, complicated by cirrhosis.  Hemoglobin levels have remained stable  despite the large output in [place].  Worsening pain overnight did raise some concerns but due to the stable hemoglobin levels, output likely due to continued drainage of old blood from the initial episode.  The overall output amount has not increased significantly compared to yesterday.  We will continue to monitor for now.  Maintain clear liquids just in case.  INR recheck did reveal slight elevation to 1.6.  Discussed case with hospitalist and they will start FFP transfusion.  labs/images/medications/previous chart entries reviewed personally and relevant changes/updates noted above.

## 2022-04-19 DIAGNOSIS — K8000 Calculus of gallbladder with acute cholecystitis without obstruction: Secondary | ICD-10-CM | POA: Diagnosis not present

## 2022-04-19 LAB — PREPARE FRESH FROZEN PLASMA

## 2022-04-19 LAB — BASIC METABOLIC PANEL
Anion gap: 6 (ref 5–15)
BUN: 15 mg/dL (ref 6–20)
CO2: 27 mmol/L (ref 22–32)
Calcium: 8.1 mg/dL — ABNORMAL LOW (ref 8.9–10.3)
Chloride: 98 mmol/L (ref 98–111)
Creatinine, Ser: 0.91 mg/dL (ref 0.61–1.24)
GFR, Estimated: >60 mL/min (ref >60)
Glucose, Bld: 129 mg/dL — ABNORMAL HIGH (ref 70–99)
Potassium: 3.9 mmol/L (ref 3.5–5.1)
Sodium: 131 mmol/L — ABNORMAL LOW (ref 135–145)

## 2022-04-19 LAB — CBC
HCT: 41.4 % (ref 39.0–52.0)
Hemoglobin: 14.4 g/dL (ref 13.0–17.0)
MCH: 31.2 pg (ref 26.0–34.0)
MCHC: 34.8 g/dL (ref 30.0–36.0)
MCV: 89.8 fL (ref 80.0–100.0)
Platelets: 115 10*3/uL — ABNORMAL LOW (ref 150–400)
RBC: 4.61 MIL/uL (ref 4.22–5.81)
RDW: 14.1 % (ref 11.5–15.5)
WBC: 20.8 10*3/uL — ABNORMAL HIGH (ref 4.0–10.5)
nRBC: 0 % (ref 0.0–0.2)

## 2022-04-19 LAB — BPAM FFP
Blood Product Expiration Date: 202310152359
ISSUE DATE / TIME: 202310141346
Unit Type and Rh: 6200

## 2022-04-19 LAB — HEPATIC FUNCTION PANEL
ALT: 64 U/L — ABNORMAL HIGH (ref 0–44)
AST: 66 U/L — ABNORMAL HIGH (ref 15–41)
Albumin: 3.2 g/dL — ABNORMAL LOW (ref 3.5–5.0)
Alkaline Phosphatase: 108 U/L (ref 38–126)
Bilirubin, Direct: 0.6 mg/dL — ABNORMAL HIGH (ref 0.0–0.2)
Indirect Bilirubin: 1.6 mg/dL — ABNORMAL HIGH (ref 0.3–0.9)
Total Bilirubin: 2.2 mg/dL — ABNORMAL HIGH (ref 0.3–1.2)
Total Protein: 6.3 g/dL — ABNORMAL LOW (ref 6.5–8.1)

## 2022-04-19 NOTE — Progress Notes (Signed)
PROGRESS NOTE  Brian Vasquez  DOB: May 07, 1976  PCP: Wayland Denis, PA-C WUJ:[id number]  DOA: 04/16/2022  LOS: 3 days  Hospital Day: 4  Brief narrative: Brian Vasquez is a 46 y.o. male with PMH significant for  remote history of alcohol use, liver cirrhosis primarily d/t alpha-1 antitrypsin deficiency, portal hypertension, esophageal varices; hypertension, hyponatremia 10/11, patient underwent elective colonoscopy.  He had 2 polyps removed and was discharged home on stable condition.  Post discharge, patient developed abdominal pain in the right upper quadrant with radiation across intra-abdominal wall.  Pain persisted overnight and hence patient presented to the ED next morning on 10/12.   CT scan of abdomen and pelvis showed gallbladder wall thickening with stones and hyperdense intraluminal contents, findings concerning for hemorrhagic cholecystitis.  There is also concern of intraluminal active bleeding.   Surgery was consulted Admitted to hospitalist service Patient underwent robotic assisted laparoscopic cholecystectomy.   Subjective: Patient was seen and examined this afternoon.   Lying on bed.   Feels better today.  No pain exacerbation last night.  Passing gas.  No BM yet.  Noted that General surgery started the patient on regular diet.   Pinkish output in JP drain.  Assessment and plan: Acute cholecystitis due to biliary calculus s/p lap chole -10/12 Postoperative ileus Patient presented for evaluation of abdominal pain nausea, vomiting Imaging finding as above concerning for cholecystitis as well as intraluminal bleeding in the gallbladder. 10/12, underwent lap chole and was found to have severe hemorrhagic cholecystitis with massive clot inside the gallbladder. Postop ileus improving.  Passing gas.  No BM yet.  Diet advanced from clears to regular diet by general surgery this morning.  Remains on IV Zosyn.  Continue pain control. Continue incentive spirometry.   Continue ambulation. Leukocytes improving on IV Zosyn. Recent Labs  Lab 04/16/22 1130 04/16/22 2220 04/17/22 0556 04/18/22 0717 04/19/22 0521  WBC 8.0 19.0* 18.4* 23.3* 20.8*    Elevated LFTs Secondary to liver cirrhosis as well as cholecystitis. Continue to monitor LFTs per general surgery, if bilirubin continues to climb up, patient may need HIDA scan to ensure no leakage. Recent Labs  Lab 04/16/22 1130 04/16/22 1323 04/16/22 2220 04/17/22 0556 04/18/22 0717 04/18/22 1146 04/19/22 0521  AST 55*  --   --  113* 86*  --  66*  ALT 49*  --   --  75* 74*  --  64*  ALKPHOS 125  --   --  97 98  --  108  BILITOT 1.8*  --   --  2.5* 1.9*  --  2.2*  BILIDIR  --   --   --   --   --   --  0.6*  IBILI  --   --   --   --   --   --  1.6*  PROT 7.4  --   --  6.1* 6.2*  --  6.3*  ALBUMIN 3.7  --   --  3.2* 3.1*  --  3.2*  INR  --  1.4* 1.4*  --   --  1.6*  --   LIPASE 42  --   --   --   --   --   --   PLT 130*  --  119* 123* 112*  --  115*  Liver cirrhosis  Portal venous hypertension Patient has a history of liver cirrhosis secondary to alpha-1 antitrypsin deficiency.  Also has evidence of portal venous hypertension and history of esophageal varices PTA on Lasix  20 mg daily, eplerenone 25 mg daily Currently both are on hold.  Low platelets Currently running low because of liver cirrhosis.  Per operative note, patient needed transfusion of 1 unit of platelet and 1 unit PRBC Intra-Op due to coagulopathy and oozing. 10/14, because of high-volume bloody output from JP drain, 1 unit of FFP was transfused. Recent Labs  Lab 04/16/22 1130 04/16/22 2220 04/17/22 0556 04/18/22 0717 04/19/22 0521  PLT 130* 119* 123* 112* 115*    Goals of care   Code Status: Full Code    Mobility:   Skin assessment:     Nutritional status:  Body mass index is 27.99 kg/m.          Diet:  Diet Order             Diet regular Room service appropriate? Yes; Fluid consistency: Thin  Diet  effective now                   DVT prophylaxis:  SCDs Start: 04/16/22 1514   Antimicrobials: IV Zosyn Fluid: None Consultants: General surgery Family Communication: Significant other at bedside  Status is: Inpatient  Continue in-hospital care because: Postoperative ileus, gradually improving Level of care: Med-Surg   Dispo: The patient is from: Home              Anticipated d/c is to: Hopefully home in 2 to 3 days.              Patient currently is not medically stable to d/c.   Difficult to place patient No     Infusions:   piperacillin-tazobactam (ZOSYN)  IV 3.375 g (04/19/22 0454)    Scheduled Meds:  sodium chloride   Intravenous Once   folic acid  1 mg Oral Daily   influenza vac split quadrivalent PF  0.5 mL Intramuscular Tomorrow-1000   pantoprazole (PROTONIX) IV  40 mg Intravenous Q24H   pregabalin  100 mg Oral TID   thiamine  100 mg Oral Daily    PRN meds: morphine injection, ondansetron **OR** ondansetron (ZOFRAN) IV, oxyCODONE   Antimicrobials: Anti-infectives (From admission, onward)    Start     Dose/Rate Route Frequency Ordered Stop   04/16/22 2200  piperacillin-tazobactam (ZOSYN) IVPB 3.375 g        3.375 g 12.5 mL/hr over 240 Minutes Intravenous Every 8 hours 04/16/22 1526     04/16/22 1330  piperacillin-tazobactam (ZOSYN) IVPB 3.375 g        3.375 g 100 mL/hr over 30 Minutes Intravenous  Once 04/16/22 1322 04/16/22 1446       Objective: Vitals:   04/19/22 0452 04/19/22 0822  BP: (!) 145/99 136/84  Pulse: 84 92  Resp: 18   Temp: 99.4 F (37.4 C) 98 F (36.7 C)  SpO2: 95% 95%    Intake/Output Summary (Last 24 hours) at 04/19/2022 1207 Last data filed at 04/19/2022 1143 Gross per 24 hour  Intake 552.92 ml  Output 640 ml  Net -87.08 ml   Filed Weights   04/16/22 1124 04/16/22 1626  Weight: 88.5 kg 88.5 kg   Weight change:  Body mass index is 27.99 kg/m.   Physical Exam: General exam: Pleasant, middle-aged Caucasian  male.  Less pain today Skin: No rashes, lesions or ulcers. HEENT: Atraumatic, normocephalic, no obvious bleeding Lungs: Clear to auscultation bilaterally CVS: Regular rate and rhythm, no murmur GI/Abd soft, mild appropriate postop tenderness, bowel sounds present.  Improving.  JP drain with bloody output CNS: Alert, awake,  oriented x3 Psychiatry: Mood appropriate Extremities: No pedal edema, no calf tenderness  Data Review: I have personally reviewed the laboratory data and studies available.  F/u labs ordered Unresulted Labs (From admission, onward)     Start     Ordered   04/19/22 0500  CBC  Daily at 5am,   R     Question:  Specimen collection method  Answer:  Lab=Lab collect   04/18/22 2254   04/19/22 0768  Basic metabolic panel  Daily at 5am,   R     Question:  Specimen collection method  Answer:  Lab=Lab collect   04/18/22 2254            Signed, Terrilee Croak, MD [facility] 04/19/2022

## 2022-04-19 NOTE — Progress Notes (Signed)
Pt ambulated in hall, tolerated well. JP continues to drain moderate amount of drainage.

## 2022-04-19 NOTE — TOC Initial Note (Signed)
Transition of Care [facility]) - Initial/Assessment Note    Patient Details  Name: Brian Vasquez MRN: [id number] Date of Birth: [birth year]  [facility]) CM/SW Contact:    Beverly Sessions, RN Phone Number: 04/19/2022, 1:22 PM  Clinical Narrative:                     Transition of Care (TOC) Screening Note   Patient Details  Name: Brian Vasquez Date of Birth: 02-19-1976   Transition of Care [facility]) CM/SW Contact:    Beverly Sessions, RN Phone Number: 04/19/2022, 1:22 PM    Transition of Care Department [facility]) has reviewed patient and no TOC needs have been identified at this time. We will continue to monitor patient advancement through interdisciplinary progression rounds. If new patient transition needs arise, please place a TOC consult.       Patient Goals and CMS Choice        Expected Discharge Plan and Services                                                Prior Living Arrangements/Services                       Activities of Daily Living Home Assistive Devices/Equipment: None ADL Screening (condition at time of admission) Patient's cognitive ability adequate to safely complete daily activities?: Yes Is the patient deaf or have difficulty hearing?: No Does the patient have difficulty seeing, even when wearing glasses/contacts?: No Does the patient have difficulty concentrating, remembering, or making decisions?: No Patient able to express need for assistance with ADLs?: Yes Does the patient have difficulty dressing or bathing?: No Independently performs ADLs?: Yes (appropriate for developmental age) Does the patient have difficulty walking or climbing stairs?: No Weakness of Legs: None Weakness of Arms/Hands: None  Permission Sought/Granted                  Emotional Assessment              Admission diagnosis:  Acute hemorrhagic cholecystitis [K81.0] Acute cholecystitis due to biliary calculus  [K80.00] Patient Active Problem List   Diagnosis Date Noted   Acute cholecystitis due to biliary calculus 04/16/2022   Acute hemorrhagic cholecystitis 04/16/2022   Colon cancer screening    Adenomatous polyp of colon    Alpha-1-antitrypsin deficiency ([place]) 03/26/2022   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

## 2022-04-19 NOTE — Progress Notes (Signed)
Subjective:  CC: Brian Vasquez is a 46 y.o. male  Hospital stay day 3, 3 Days Post-Op  s/p robotic assisted laparoscopic cholecystectomy for hemorrhagic cholecystitis, complicated by cirrhosis  HPI: No acute issues overnight.  Pain better controlled today.  Continues to have serosanguineous drainage.  Tolerating clear liquid.  ROS:  General: Denies weight loss, weight gain, fatigue, fevers, chills, and night sweats. Heart: Denies chest pain, palpitations, racing heart, irregular heartbeat, leg pain or swelling, and decreased activity tolerance. Respiratory: Denies breathing difficulty, shortness of breath, wheezing, cough, and sputum. GI: Denies change in appetite, heartburn, nausea, vomiting, constipation, diarrhea, and blood in stool. GU: Denies difficulty urinating, pain with urinating, urgency, frequency, blood in urine.   Objective:   Temp:  [98 F (36.7 C)-99.4 F (37.4 C)] 98 F (36.7 C) (10/15 0822) Pulse Rate:  [81-92] 92 (10/15 0822) Resp:  [18-20] 18 (10/15 0452) BP: (125-154)/(82-99) 136/84 (10/15 0822) SpO2:  [92 %-95 %] 95 % (10/15 0822)     Height: '5\' 10"'$  (177.8 cm) Weight: 88.5 kg BMI (Calculated): 27.99   Intake/Output this shift:   Intake/Output Summary (Last 24 hours) at 04/19/2022 0912 Last data filed at 04/19/2022 1610 Gross per 24 hour  Intake 552.92 ml  Output 760 ml  Net -207.08 ml    Constitutional :  alert, cooperative, appears stated age, and no distress  Respiratory:  clear to auscultation bilaterally  Cardiovascular:  regular rate and rhythm  Gastrointestinal: Soft, no guarding, focal tenderness to palpation around JP site in right upper quadrant again.  Improved..  JP with bloody output, more serosanguineous than last night again. .   Skin: Cool and moist.   Psychiatric: Normal affect, non-agitated, not confused       LABS:     Latest Ref Rng & Units 04/19/2022    5:21 AM 04/18/2022    7:17 AM 04/17/2022    5:56 AM  CMP  Glucose 70 -  99 mg/dL 129  119  158   BUN 6 - 20 mg/dL '15  14  13   '$ Creatinine 0.61 - 1.24 mg/dL 0.91  0.75  0.77   Sodium 135 - 145 mmol/L 131  133  135   Potassium 3.5 - 5.1 mmol/L 3.9  4.0  4.3   Chloride 98 - 111 mmol/L 98  100  106   CO2 22 - 32 mmol/L '27  27  25   '$ Calcium 8.9 - 10.3 mg/dL 8.1  8.1  8.5   Total Protein 6.5 - 8.1 g/dL 6.3  6.2  6.1   Total Bilirubin 0.3 - 1.2 mg/dL 2.2  1.9  2.5   Alkaline Phos 38 - 126 U/L 108  98  97   AST 15 - 41 U/L 66  86  113   ALT 0 - 44 U/L 64  74  75       Latest Ref Rng & Units 04/19/2022    5:21 AM 04/18/2022   11:46 AM 04/18/2022    7:17 AM  CBC  WBC 4.0 - 10.5 K/uL 20.8   23.3   Hemoglobin 13.0 - 17.0 g/dL 14.4  13.7  13.9   Hematocrit 39.0 - 52.0 % 41.4  39.3  39.8   Platelets 150 - 400 K/uL 115   112     RADS: N/a Assessment:    s/p robotic assisted laparoscopic cholecystectomy for hemorrhagic cholecystitis, complicated by cirrhosis.  Hemoglobin levels have continued to remain stable despite the large output in JP.  JP output  likely residual from initial episode.  He is status post FFP transfusion as well.  Advance to regular diet.  Recheck labs in the morning, to make sure leukocytosis continues to resolve.  Continue IV antibiotics.  Close monitoring in the meantime.  labs/images/medications/previous chart entries reviewed personally and relevant changes/updates noted above

## 2022-04-19 NOTE — Plan of Care (Signed)

## 2022-04-20 ENCOUNTER — Encounter: Payer: Self-pay | Admitting: Gastroenterology

## 2022-04-20 DIAGNOSIS — K8000 Calculus of gallbladder with acute cholecystitis without obstruction: Secondary | ICD-10-CM | POA: Diagnosis not present

## 2022-04-20 LAB — BASIC METABOLIC PANEL
Anion gap: 6 (ref 5–15)
BUN: 11 mg/dL (ref 6–20)
CO2: 31 mmol/L (ref 22–32)
Calcium: 8 mg/dL — ABNORMAL LOW (ref 8.9–10.3)
Chloride: 97 mmol/L — ABNORMAL LOW (ref 98–111)
Creatinine, Ser: 0.78 mg/dL (ref 0.61–1.24)
GFR, Estimated: >60 mL/min (ref >60)
Glucose, Bld: 112 mg/dL — ABNORMAL HIGH (ref 70–99)
Potassium: 3.4 mmol/L — ABNORMAL LOW (ref 3.5–5.1)
Sodium: 134 mmol/L — ABNORMAL LOW (ref 135–145)

## 2022-04-20 LAB — CBC
HCT: 40 % (ref 39.0–52.0)
Hemoglobin: 14 g/dL (ref 13.0–17.0)
MCH: 31.3 pg (ref 26.0–34.0)
MCHC: 35 g/dL (ref 30.0–36.0)
MCV: 89.3 fL (ref 80.0–100.0)
Platelets: 116 10*3/uL — ABNORMAL LOW (ref 150–400)
RBC: 4.48 MIL/uL (ref 4.22–5.81)
RDW: 13.9 % (ref 11.5–15.5)
WBC: 15.6 10*3/uL — ABNORMAL HIGH (ref 4.0–10.5)
nRBC: 0 % (ref 0.0–0.2)

## 2022-04-20 LAB — HEPATIC FUNCTION PANEL
ALT: 72 U/L — ABNORMAL HIGH (ref 0–44)
AST: 77 U/L — ABNORMAL HIGH (ref 15–41)
Albumin: 2.9 g/dL — ABNORMAL LOW (ref 3.5–5.0)
Alkaline Phosphatase: 137 U/L — ABNORMAL HIGH (ref 38–126)
Bilirubin, Direct: 0.5 mg/dL — ABNORMAL HIGH (ref 0.0–0.2)
Indirect Bilirubin: 1.3 mg/dL — ABNORMAL HIGH (ref 0.3–0.9)
Total Bilirubin: 1.8 mg/dL — ABNORMAL HIGH (ref 0.3–1.2)
Total Protein: 5.8 g/dL — ABNORMAL LOW (ref 6.5–8.1)

## 2022-04-20 LAB — SURGICAL PATHOLOGY

## 2022-04-20 MED ORDER — OXYCODONE HCL 5 MG PO TABS: 5.0000 mg | ORAL_TABLET | Freq: Four times a day (QID) | ORAL | 0 refills | Status: DC | PRN

## 2022-04-20 MED ORDER — PREGABALIN 100 MG PO CAPS: 100.0000 mg | ORAL_CAPSULE | Freq: Three times a day (TID) | ORAL | 0 refills | Status: DC

## 2022-04-20 MED ORDER — AMOXICILLIN-POT CLAVULANATE 875-125 MG PO TABS: 1.0000 | ORAL_TABLET | Freq: Two times a day (BID) | ORAL | 0 refills | Status: AC

## 2022-04-20 NOTE — Discharge Instructions (Signed)
In addition to included general post-operative instructions,  Diet: Resume home diet. Recommend avoiding or limiting fatty/greasy foods over the next few days/week. If you do eat these, you may (or may not) notice diarrhea. This is expected while your body adjusts to not having a gallbladder, and it typically resolves with time.    Activity: No heavy lifting >20 pounds (children, pets, laundry, garbage) or strenuous activity for 4 weeks from date of surgery, but light activity and walking are encouraged. Do not drive or drink alcohol if taking narcotic pain medications or having pain that might distract from driving.  Wound care: If you can keep drain site covered and water proofed, you may shower/get incision wet with soapy water and pat dry (do not rub incisions), but no baths or submerging incision underwater until follow-up.   Drain: Monitor and record drain output daily; hand out given for this. Please bring this to follow up appointment. This will likely have high output given history of cirrhosis.   Medications: Resume all home medications. For mild to moderate pain: acetaminophen (Tylenol) or ibuprofen/naproxen (if no kidney disease). Combining Tylenol with alcohol can substantially increase your risk of causing liver disease. Narcotic pain medications, if prescribed, can be used for severe pain, though may cause nausea, constipation, and drowsiness. Do not combine Tylenol and Percocet (or similar) within a 6 hour period as Percocet (and similar) contain(s) Tylenol. If you do not need the narcotic pain medication, you do not need to fill the prescription.  Call office [phone]) at any time if any questions, worsening pain, fevers/chills, bleeding, drainage from incision site, or other concerns.

## 2022-04-20 NOTE — Hospital Course (Addendum)
46 y.o.m  w/ remote history of alcohol use, liver cirrhosis primarily d/t alpha-1 antitrypsin deficiency, portal hypertension, esophageal varices; hypertension, hyponatremia on 10/11 underwent elective colonoscopy.  He had 2 polyps removed and was discharged home on stable condition.  Post discharge, patient developed abdominal pain in the right upper quadrant with radiation across intra-abdominal wall.  Pain persisted overnight and hence patient presented to the ED next morning on 04/16/22> I n ED CT scan of abdomen and pelvis showed gallbladder wall thickening with stones and hyperdense intraluminal contents, findings concerning for hemorrhagic cholecystitis.  There is also concern of intraluminal active bleeding.  Patient was admitted, seen by surgery underwent laparoscopic cholecystectomy.  Following which he had Postop ileus improving clinically, had blood mixed tenderness FFP given 10/14.  At this time tolerating diet leukocytosis peaked at 23.39 improving to 15.6, hemoglobin is stable renal function is stable, cleared for discharge by surgery this morning>, they have prescribed antibiotics and pain medication and they will arrange outpatient follow-up.

## 2022-04-20 NOTE — Discharge Summary (Signed)
Physician Discharge Summary  Brian Vasquez YSA:[id number] DOB: 1976-03-22 DOA: 04/16/2022  PCP: Wayland Denis, PA-C  Admit date: 04/16/2022 Discharge date: 04/20/2022 Recommendations for Outpatient Follow-up:  Follow up with PCP, gastroenterology and general surgery as outpatient, call for appointment Please obtain CBC, LFTS in one week  Discharge Dispo: Home Discharge Condition: Stable Code Status:   Code Status: Full Code Diet recommendation:  Diet Order             Diet regular Room service appropriate? Yes; Fluid consistency: Thin  Diet effective now                    Brief/Interim Summary: 46 y.o.m  w/ remote history of alcohol use, liver cirrhosis primarily d/t alpha-1 antitrypsin deficiency, portal hypertension, esophageal varices; hypertension, hyponatremia on 10/11 underwent elective colonoscopy.  He had 2 polyps removed and was discharged home on stable condition.  Post discharge, patient developed abdominal pain in the right upper quadrant with radiation across intra-abdominal wall.  Pain persisted overnight and hence patient presented to the ED next morning on 04/16/22> I n ED CT scan of abdomen and pelvis showed gallbladder wall thickening with stones and hyperdense intraluminal contents, findings concerning for hemorrhagic cholecystitis.  There is also concern of intraluminal active bleeding.  Patient was admitted, seen by surgery underwent laparoscopic cholecystectomy.  Following which he had Postop ileus improving clinically.At this time tolerating diet leukocytosis peaked at 23.39 improving to 15.6, hemoglobin is stable renal function is stable, cleared for discharge by surgery this morning>, they have prescribed antibiotics and pain medication and they will arrange outpatient follow-up.  On my exam he is alert awake oriented resting comfortably tolerating diet, eager to go home today  Discharge Diagnoses:  Principal Problem:   Acute cholecystitis due to biliary  calculus Active Problems:   Portal venous hypertension (HCC)   Liver cirrhosis ([place])   Acute hemorrhagic cholecystitis  Acute cholecystitis due to biliary calculus s/p lap chole -10/12 Postoperative ileus Elevated LFTs: S/P laparoscopic cholecystectomy.  Following which he had Postop ileus but improved clinically.At this time tolerating diet leukocytosis peaked at 23.39 improving to 15.6, hemoglobin is stable renal function is stable, cleared for discharge by surgery this morning>, they have prescribed antibiotics and pain medication and they will arrange outpatient follow-up.  Advised LFTs and CBC check in 1 week.  He is being discharged with his drain in place with instruction for drain care as per surgery.  Liver cirrhosis  Portal venous hypertension Patient has a history of liver cirrhosis secondary to alpha-1 antitrypsin deficiency.  Also has evidence of portal venous hypertension and history of esophageal varices.PTA on Lasix 20 mg daily, eplerenone 25 mg daily Held here advised to resume on discharge and follow-up with PCP.  Thrombocytopenia holding in more than 110 range, no obvious bleeding:Per operative note, patient needed transfusion of 1 unit of platelet and 1 unit PRBC Intra-Op due to coagulopathy and oozing.  CBC check in 1 week  Consults: General surgery Subjective: Alert awake oriented resting comfortably tolerating diet  Discharge Exam: Vitals:   04/20/22 0607 04/20/22 0846  BP: 126/76 131/80  Pulse: 89 85  Resp: 16 18  Temp: 98.3 F (36.8 C) 98.4 F (36.9 C)  SpO2: 93% 96%   General: Pt is alert, awake, not in acute distress Cardiovascular: RRR, S1/S2 +, no rubs, no gallops Respiratory: CTA bilaterally, no wheezing, no rhonchi Abdominal: Soft, NT, ND, bowel sounds + Extremities: no edema, no cyanosis  Discharge Instructions  Discharge  Instructions     Discharge instructions   Complete by: As directed    Please call call MD or return to ER for similar  or worsening recurring problem that brought you to hospital or if any fever,nausea/vomiting,abdominal pain, uncontrolled pain, chest pain,  shortness of breath or any other alarming symptoms.  Please check cbc, cmp in 1 wk Please follow-up your doctor as instructed in a week time and call the office for appointment.  Please avoid alcohol, smoking, or any other illicit substance and maintain healthy habits including taking your regular medications as prescribed.  You were cared for by a hospitalist during your hospital stay. If you have any questions about your discharge medications or the care you received while you were in the hospital after you are discharged, you can call the unit and ask to speak with the hospitalist on call if the hospitalist that took care of you is not available.  Once you are discharged, your primary care physician will handle any further medical issues. Please note that NO REFILLS for any discharge medications will be authorized once you are discharged, as it is imperative that you return to your primary care physician (or establish a relationship with a primary care physician if you do not have one) for your aftercare needs so that they can reassess your need for medications and monitor your lab values   Discharge wound care:   Complete by: As directed    As needed, cont drain care as per surgery   Increase activity slowly   Complete by: As directed       Allergies as of 04/20/2022   Not on File      Medication List     TAKE these medications    amoxicillin-clavulanate 875-125 MG tablet Commonly known as: AUGMENTIN Take 1 tablet by mouth 2 (two) times daily for 10 days.   eplerenone 50 MG tablet Commonly known as: INSPRA Take 2 tablets by mouth daily.   folic acid 1 MG tablet Commonly known as: FOLVITE Take 1 tablet by mouth daily.   furosemide 20 MG tablet Commonly known as: Lasix Take 1 tablet (20 mg total) by mouth daily.   GLUCOSAMINE  PO Take by mouth.   Melatonin 10 MG Tabs Take 20 mg by mouth at bedtime.   oxyCODONE 5 MG immediate release tablet Commonly known as: Oxy IR/ROXICODONE Take 1 tablet (5 mg total) by mouth every 6 (six) hours as needed for severe pain or breakthrough pain.   pregabalin 100 MG capsule Commonly known as: LYRICA Take 1 capsule (100 mg total) by mouth 3 (three) times daily for 14 days.   thiamine 100 MG tablet Commonly known as: VITAMIN B1 Take 1 tablet (100 mg total) by mouth daily.               Discharge Care Instructions  (From admission, onward)           Start     Ordered   04/20/22 0000  Discharge wound care:       Comments: As needed, cont drain care as per surgery   04/20/22 1018            Follow-up Information     Pabon, [place] F, MD. Schedule an appointment as soon as possible for a visit in 7 day(s).   Specialty: General Surgery Why: Follow up in 7-10 days for drain removal; s/p cholecystectomy Contact information: [address] [place] [place] [address] [phone]  Wayland Denis, PA-C Follow up in 1 week(s).   Specialty: Physician Assistant Why: Check CBC and LFTs in 1 wk from PCP Contact information: [place] [address] [phone]         [name], MD. Schedule an appointment as soon as possible for a visit in 2 week(s).   Specialty: Gastroenterology Why: Hospital follow up; cirrhosis, s/p cholecystectomy Contact information: [place] [address] [phone]                Not on File  The results of significant diagnostics from this hospitalization (including imaging, microbiology, ancillary and laboratory) are listed below for reference.    Microbiology: No results found for this or any previous visit (from the past 240 hour(s)).  Procedures/Studies: US ABDOMEN LIMITED RUQ (LIVER/GB)  Result Date:  04/16/2022 CLINICAL DATA:  Right upper quadrant pain for 2 days. EXAM: ULTRASOUND ABDOMEN LIMITED RIGHT UPPER QUADRANT COMPARISON:  CT 04/16/2022 FINDINGS: Gallbladder: As noted on the CT from earlier today the gallbladder appears distended by sludge and multiple stones which measure up to 7 mm. Gallbladder wall thickening is noted measuring 3.5 mm. Common bile duct: Diameter: 4 mm.  No intrahepatic bile duct dilatation. Liver: The liver appears heterogeneous with a nodular contour compatible with cirrhosis. There is a small iso to hypoechoic structure within the right lobe of liver measuring 2.6 x 1.8 x 1.7 cm. Portal vein is patent on color Doppler imaging with normal direction of blood flow towards the liver. Other: None. IMPRESSION: 1. The gallbladder is distended by sludge and multiple stones. Gallbladder wall thickening is noted, which in the setting of underlying cirrhosis may be a nonspecific finding. If there is clinical concern for acute cholecystitis consider nuclear medicine HIDA scan. 2. Morphologic features of the liver compatible with cirrhosis. 3. Indeterminate hypoechoic structure within the right lobe of liver measuring 2.6 cm. This may represent a small hepatic adenoma or focal nodular hyperplasia. On the recent MRI of the abdomen several indeterminate lesions were identified for which follow-up imaging is advised in 6 months. Electronically Signed   By: Kerby Moors M.D.   On: 04/16/2022 15:04   CT ABDOMEN PELVIS W CONTRAST  Result Date: 04/16/2022 CLINICAL DATA:  Abdominal pain EXAM: CT ABDOMEN AND PELVIS WITH CONTRAST TECHNIQUE: Multidetector CT imaging of the abdomen and pelvis was performed using the standard protocol following bolus administration of intravenous contrast. RADIATION DOSE REDUCTION: This exam was performed according to the departmental dose-optimization program which includes automated exposure control, adjustment of the mA and/or kV according to patient size and/or  use of iterative reconstruction technique. CONTRAST:  150m OMNIPAQUE IOHEXOL 300 MG/ML  SOLN COMPARISON:  Abdominal MRI dated February 20, 2022 FINDINGS: Lower chest: No acute abnormality. Hepatobiliary: Cirrhotic liver morphology. Small lesions of the right hepatic lobe described on prior MRI not well visualized, likely due to contrast timing. Recanalized paraumbilical vein. Gallbladder wall thickening with stones and hyperdense intraluminal contents. Focal area of hyperdensity measuring 8 mm on series 2, image 26 with similar attenuation to aortic blood pool, increases in size on delayed imaging. No biliary ductal dilation. Pancreas: Unremarkable. No pancreatic ductal dilatation or surrounding inflammatory changes. Spleen: Splenomegaly, measuring up to 17.0 cm. Adrenals/Urinary Tract: Bilateral adrenal glands are unremarkable. No hydronephrosis or nephrolithiasis. Stomach/Bowel: Stomach is within normal limits. Appendix appears normal. No evidence of bowel wall thickening, distention, or inflammatory changes. Vascular/Lymphatic: Mild aortic atherosclerosis. Splenorenal varices. No enlarged abdominal or  pelvic lymph nodes. Reproductive: Prostate is unremarkable. Other: No abdominal wall hernia or abnormality. No abdominopelvic ascites. Musculoskeletal: No acute or significant osseous findings. IMPRESSION: 1. Gallbladder wall thickening with stones and hyperdense intraluminal contents, findings are concerning for hemorrhagic cholecystitis. There is in intraluminal hyperdense focus which increases in size on delayed imaging, concerning for intraluminal active bleeding. 2. Cirrhotic liver morphology with evidence of portal hypertension including splenomegaly and varices. 3. Small lesions of the right hepatic lobe described on prior MRI not well visualized, likely due to contrast timing. 4. Aortic Atherosclerosis (ICD10-I70.0). Critical Value/emergent results were called by telephone at the time of interpretation on  04/16/2022 at 1:18 pm to provider [facility] , who verbally acknowledged these results. Electronically Signed   By: Yetta Glassman M.D.   On: 04/16/2022 13:19    Labs: BNP (last 3 results) No results for input(s): "BNP" in the last 8760 hours. Basic Metabolic Panel: Recent Labs  Lab 04/16/22 1130 04/17/22 0556 04/18/22 0717 04/19/22 0521 04/20/22 0509  NA 137 135 133* 131* 134*  K 4.5 4.3 4.0 3.9 3.4*  CL 106 106 100 98 97*  CO2 '26 25 27 27 31  '$ GLUCOSE 110* 158* 119* 129* 112*  BUN '13 13 14 15 11  '$ CREATININE 0.78 0.77 0.75 0.91 0.78  CALCIUM 9.4 8.5* 8.1* 8.1* 8.0*   Liver Function Tests: Recent Labs  Lab 04/16/22 1130 04/17/22 0556 04/18/22 0717 04/19/22 0521 04/20/22 0509  AST 55* 113* 86* 66* 77*  ALT 49* 75* 74* 64* 72*  ALKPHOS 125 97 98 108 137*  BILITOT 1.8* 2.5* 1.9* 2.2* 1.8*  PROT 7.4 6.1* 6.2* 6.3* 5.8*  ALBUMIN 3.7 3.2* 3.1* 3.2* 2.9*   Recent Labs  Lab 04/16/22 1130  LIPASE 42   No results for input(s): "AMMONIA" in the last 168 hours. CBC: Recent Labs  Lab 04/16/22 1130 04/16/22 2220 04/17/22 0556 04/18/22 0717 04/18/22 1146 04/19/22 0521 04/20/22 0509  WBC 8.0 19.0* 18.4* 23.3*  --  20.8* 15.6*  NEUTROABS 4.6  --   --   --   --   --   --   HGB 15.7 15.3 14.1 13.9 13.7 14.4 14.0  HCT 46.1 43.7 40.3 39.8 39.3 41.4 40.0  MCV 90.2 88.1 88.8 89.4  --  89.8 89.3  PLT 130* 119* 123* 112*  --  115* 116*   Recent Labs  Lab 04/18/22 0318  GLUCAP 145*  Urinalysis    Component Value Date/Time   COLORURINE AMBER (A) 04/16/2022 1228   APPEARANCEUR HAZY (A) 04/16/2022 1228   LABSPEC 1.021 04/16/2022 1228   PHURINE 5.0 04/16/2022 1228   GLUCOSEU NEGATIVE 04/16/2022 1228   HGBUR NEGATIVE 04/16/2022 1228   BILIRUBINUR NEGATIVE 04/16/2022 1228   KETONESUR NEGATIVE 04/16/2022 1228   PROTEINUR NEGATIVE 04/16/2022 1228   NITRITE NEGATIVE 04/16/2022 1228   LEUKOCYTESUR NEGATIVE 04/16/2022 1228   Sepsis Labs Recent Labs  Lab 04/17/22 0556  04/18/22 0717 04/19/22 0521 04/20/22 0509  WBC 18.4* 23.3* 20.8* 15.6*   Microbiology No results found for this or any previous visit (from the past 240 hour(s)).  Time coordinating discharge: 25 minutes SIGNED: Antonieta Pert, MD  [facility] 04/20/2022, 10:19 AM  If 7PM-7AM, please contact night-coverage [email or web address]

## 2022-04-20 NOTE — Progress Notes (Signed)
2 family members said patient had [place] delivered but had not made it to the patient. Contacted Supply chain. They had package. Got package and gave it directly to the patient. He was pleased.

## 2022-04-20 NOTE — Progress Notes (Signed)
[facility] Day(s): 4.   Post op day(s): 4 Days Post-Op.   Interval History:  Patient seen and examined No acute events or new complaints overnight.  Patient reports he is doing well; feeling improvement No fever, chills, nausea, emesis Leukocytosis continues to improve; 15.6K Hgb remains normal; 14.0 Renal function normal; sCr - 0.78; UO - 2150 Mild hypokalemia to 3.4 Surgical drain with 600 ccs out; serosanguinous  Diet advanced to regular diet; passing IV Zosyn  Vital signs in last 24 hours: [min-max] current  Temp:  [98 F (36.7 C)-99 F (37.2 C)] 98.3 F (36.8 C) (10/16 0607) Pulse Rate:  [85-96] 89 (10/16 0607) Resp:  [16-19] 16 (10/16 0607) BP: (126-136)/(74-95) 126/76 (10/16 0607) SpO2:  [88 %-95 %] 93 % (10/16 0607)     Height: '5\' 10"'$  (177.8 cm) Weight: 88.5 kg BMI (Calculated): 27.99   Intake/Output last 2 shifts:  10/15 0701 - 10/16 0700 In: 595.1 [P.O.:480; IV Piggyback:115.1] Out: 2750 [Urine:2150; Drains:600]   Physical Exam:  Constitutional: alert, cooperative and no distress  Respiratory: breathing non-labored at rest  Cardiovascular: regular rate and sinus rhythm  Gastrointestinal: soft, generalized soreness, non-distended, no rebound/guarding. Surgical drain with serosanguinous output Integumentary: Laparoscopic incisions are CDI with dermabond, no erythema or drainage   Labs:     Latest Ref Rng & Units 04/20/2022    5:09 AM 04/19/2022    5:21 AM 04/18/2022   11:46 AM  CBC  WBC 4.0 - 10.5 K/uL 15.6  20.8    Hemoglobin 13.0 - 17.0 g/dL 14.0  14.4  13.7   Hematocrit 39.0 - 52.0 % 40.0  41.4  39.3   Platelets 150 - 400 K/uL 116  115        Latest Ref Rng & Units 04/20/2022    5:09 AM 04/19/2022    5:21 AM 04/18/2022    7:17 AM  CMP  Glucose 70 - 99 mg/dL 112  129  119   BUN 6 - 20 mg/dL '11  15  14   '$ Creatinine 0.61 - 1.24 mg/dL 0.78  0.91  0.75   Sodium 135 - 145 mmol/L 134  131  133    Potassium 3.5 - 5.1 mmol/L 3.4  3.9  4.0   Chloride 98 - 111 mmol/L 97  98  100   CO2 22 - 32 mmol/L '31  27  27   '$ Calcium 8.9 - 10.3 mg/dL 8.0  8.1  8.1   Total Protein 6.5 - 8.1 g/dL  6.3  6.2   Total Bilirubin 0.3 - 1.2 mg/dL  2.2  1.9   Alkaline Phos 38 - 126 U/L  108  98   AST 15 - 41 U/L  66  86   ALT 0 - 44 U/L  64  74      Imaging studies: No new pertinent imaging studies   Assessment/Plan:  46 y.o. male 4 Days Post-Op s/p robotic assisted laparoscopic cholecystectomy for hemorrhagic cholecystitis, complicated by cirrhosis   - Okay to continue regular diet as tolerated  - Continue IV Abx (Zosyn); will send with 10 days PO Augmentin to complete 14 total   - Continue surgical drain; monitor and record output. Unfortunately, with his cirrhosis history, this will likely have significant output. Hand out given to record at home.   - Monitor abdominal examination; on-going pain control  - Pain control prn             - Monitor leukocytosis;  improved - Mobilize             - Further management per primary service    - Discharge Planning: Okay to discharge from surgical perspective, will go with drain. Will send pain Rx and Abx as well. Follow up in 7-10 days for drain removal.   All of the above findings and recommendations were discussed with the patient, and the medical team, and all of patient's questions were answered to his expressed satisfaction.  -- Edison Simon, PA-C [facility] 04/20/2022, 7:16 AM M-F: 7am - 4pm

## 2022-04-21 NOTE — Telephone Encounter (Signed)
Am glad you updated me since I was not aware that you had surgery , am glad the gall bladder is out and you should be better soon , Ensure you follow up for post op check and visit with PCP

## 2022-04-27 ENCOUNTER — Encounter: Payer: Self-pay | Admitting: Surgery

## 2022-04-27 ENCOUNTER — Other Ambulatory Visit: Payer: Self-pay

## 2022-04-27 ENCOUNTER — Ambulatory Visit (INDEPENDENT_AMBULATORY_CARE_PROVIDER_SITE_OTHER): Payer: 59 | Admitting: Surgery

## 2022-04-27 ENCOUNTER — Encounter: Payer: Self-pay | Admitting: Gastroenterology

## 2022-04-27 VITALS — BP 136/96 | HR 116 | Temp 98.0°F | Ht 70.0 in | Wt 191.0 lb

## 2022-04-27 DIAGNOSIS — K810 Acute cholecystitis: Secondary | ICD-10-CM

## 2022-04-27 DIAGNOSIS — Z09 Encounter for follow-up examination after completed treatment for conditions other than malignant neoplasm: Secondary | ICD-10-CM

## 2022-04-27 NOTE — Patient Instructions (Addendum)
Call and schedule an appointment with your Liver physician.   Follow up with Thedore Mins next week.

## 2022-04-27 NOTE — Progress Notes (Signed)
S/p lap chole due to hemorrhagic cholecystitis on cirrhotic child B Doing well Drain now 1200cc yesterday and before averaging around 500-600cc  PE NAD ABd: soft, incisions c/d/I, jp serous cloudy fluid  A/P doing well considering injury Pt to see transplant team Double lasix to 20 mg BID Stop a/bs and other meds RTC 10 days and if output is better controlled may DC drain He understands that he will require paracentesis as surgery and recent injury stunned his fragile liver

## 2022-04-30 ENCOUNTER — Ambulatory Visit: Payer: 59 | Admitting: Gastroenterology

## 2022-04-30 ENCOUNTER — Encounter: Payer: Self-pay | Admitting: Gastroenterology

## 2022-04-30 VITALS — BP 145/92 | HR 99 | Temp 98.7°F | Ht 70.0 in | Wt 191.6 lb

## 2022-04-30 DIAGNOSIS — K7460 Unspecified cirrhosis of liver: Secondary | ICD-10-CM | POA: Diagnosis not present

## 2022-04-30 DIAGNOSIS — R188 Other ascites: Secondary | ICD-10-CM | POA: Diagnosis not present

## 2022-04-30 MED ORDER — ALBUMIN HUMAN 25 % IV SOLN: 25.0000 g | Freq: Once | INTRAVENOUS | Status: AC

## 2022-04-30 NOTE — Addendum Note (Signed)
Addended by: Wayna Chalet on: 04/30/2022 01:29 PM   Modules accepted: Orders

## 2022-04-30 NOTE — Patient Instructions (Signed)
Please do daily weight checks.

## 2022-04-30 NOTE — Progress Notes (Signed)
Wyline Mood MD, MRCP(U.K) [address]  [address]  [place], [place] [address]  Main: [phone]  Fax: [phone]   Primary Care Physician: Carren Rang, PA-C  Primary Gastroenterologist:  Dr. Wyline Mood   Chief Complaint  Patient presents with   Ascites    HPI: Brian Vasquez is a 46 y.o. 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 Sherryll Burger at [facility].  Biopsy was benign.   I initially met him in the hospital when he was admitted in October 2022 when he presented with ascites.ultrasound which showed cirrhosis with a 4.9 cm masslike lesion in the caudate lobe.  He underwent an MRI of the liver without contrast and with contrast to look at the lesion and showed no concern for malignancy simply noted focal nodular area of the liver.  Plan was to correlate with AFP which was elevated at 79.5.  Ascetic fluid showed no evidence of SBP and likely was due to portal hypertension.  INR was 1.7.  Hemoglobin 13.4.  Hepatitis B core antibody was positive, hepatitis C antibody , hepatitis B surface antibody was negative .    Admitted to the hospital for hyponatremia and discharged on 05/12/2021. Likely secondary to combination of over diuresis and salt restriction. He didn't get his labs a few days after his last increase in diurertics.    05/12/2021: EGD:  3 bands placed over non bleeding large esophageal varices. Duodenal bx shows no evidence of celiac disease.   Low alpha 1 antitrypsin levels, Elevated Ferritin and iron % saturation 88. INR 1.3 Ascitic fluid  shows SAAG suggestive of portal hypertension. AFP elevated 63.  MRI showed no clear mass but since AFP still elevated will plan  further evaluation   05/29/2021 MRI liver with and without contrast shows no evidence of HCC but does another area which shows hyperenhancement and repeat MRI in 3 months has been recommended.  Features of portal hypertension seen.  07/11/2021: Liver biopsy shows features of alpha-1 antitrypsin deficiency, chronic cirrhotic liver with inflammatory activity no dysplastic nodule or carcinoma seen. 08/22/2021 MRI liver with contrast shows no significant change since previous study.  LI-RADS 3 lesion noted.  Mild volume ascites.  Features of portal hypertension including splenomegaly and varices.   08/20/2021 EGD: Severe portal hypertensive gastropathy.  No esophageal varices 08/28/2021 was seen by [facility] for alpha-1 antitrypsin deficiency recommended getting vaccinations for hepatitis A and B.  09/23/2021: Followed up with Dr. Sherryll Burger at [facility] had an MRI showing cirrhosis with portal hypertension 1.2 cm hepatic segment nodule compatible with benign biopsy no focal hepatic lesions 02/23/2022: EGD: Portal hypertensive gastropathy no esophageal varices repeat EGD in 1 year   02/22/2022: Liver mass previously seen stable recommended repeat MRI in 6 months   03/25/2022 visit with Dr. Sherryll Burger at [facility].  MELD score of 10 following with specialist for alpha-1 antitrypsin deficiency at pulmonary clinic on Lasix 20 mg and eplerenone 100 mg not required a paracentesis since January 2023 no recent episodes of hepatic encephalopathy    Interval history 03/30/2022-04/30/2022   04/15/2022: Colonoscopy: Two 4 to 5 mm polyps in the ascending colon removed with a cold snare.  There were hyperplastic  04/16/2022 presented to the  hospital with right upper quadrant pain.  Was diagnosed with hemorrhagic cholecystitis.  Underwent laparoscopic cholecystectomy with Dr. Dahlia Byes.  Developed ascites commenced on 40 mg of Lasix a day.  Path report showed acute on chronic cholecystitis without cholelithiasis.  He is today here to see me for  postoperative follow-up due to development of ascites.   Since discharge she has a small drain and he has been draining about 100 cc of an hour.  He recollects that he received a lot of saline during his hospitalization.  Presently on 40 mg of Lasix and 100 mg of eplerenone.  No NSAID use.  On a low-salt diet.  Not checking daily weights at this point of time.     Current Outpatient Medications  Medication Sig Dispense Refill   amoxicillin-clavulanate (AUGMENTIN) 875-125 MG tablet Take 1 tablet by mouth 2 (two) times daily for 10 days. 20 tablet 0   eplerenone (INSPRA) 50 MG tablet Take 2 tablets by mouth daily.     folic acid (FOLVITE) 1 MG tablet Take 1 tablet by mouth daily.     furosemide (LASIX) 20 MG tablet Take 1 tablet (20 mg total) by mouth daily. 30 tablet 2   Glucosamine HCl (GLUCOSAMINE PO) Take by mouth.     Glucosamine HCl 1500 MG TABS Take by mouth.     Melatonin 10 MG TABS Take 20 mg by mouth at bedtime.     pregabalin (LYRICA) 100 MG capsule Take 1 capsule (100 mg total) by mouth 3 (three) times daily for 14 days. 42 capsule 0   thiamine 100 MG tablet Take 1 tablet (100 mg total) by mouth daily.     No current facility-administered medications for this visit.    Allergies as of 04/30/2022   (No Known Allergies)    ROS:  General: Negative for anorexia, weight loss, fever, chills, fatigue, weakness. ENT: Negative for hoarseness, difficulty swallowing , nasal congestion. CV: Negative for chest pain, angina, palpitations, dyspnea on exertion, peripheral edema.  Respiratory: Negative for dyspnea at rest, dyspnea on exertion, cough, sputum, wheezing.  GI: See history of present illness. GU:  Negative for dysuria, hematuria, urinary incontinence, urinary frequency, nocturnal urination.  Endo: Negative for unusual weight change.    Physical Examination:   BP (!) 145/92   Pulse 99   Temp 98.7 F (37.1 C) (Oral)   Ht $R'5\' 10"'nk$  (1.778 m)   Wt 191 lb 9.6 oz (86.9 kg)    BMI 27.49 kg/m   General: Well-nourished, well-developed in no acute distress.  Eyes: No icterus. Conjunctivae pink. Abdomen: Right lower quadrant clear fluid present in the bulb bowel sounds are normal, nontender, nondistended, no hepatosplenomegaly or masses, no abdominal bruits or hernia , no rebound or guarding.   Extremities: No lower extremity edema. No clubbing or deformities. Neuro: Alert and oriented x 3.  Grossly intact. Skin: Warm and dry, no jaundice.   Psych: Alert and cooperative, normal mood and affect.   Imaging Studies: US ABDOMEN LIMITED RUQ (LIVER/GB)  Result Date: 04/16/2022 CLINICAL DATA:  Right upper quadrant pain for 2 days. EXAM: ULTRASOUND ABDOMEN LIMITED RIGHT UPPER QUADRANT COMPARISON:  CT 04/16/2022 FINDINGS: Gallbladder: As noted on the CT from earlier today the gallbladder appears distended by sludge and multiple stones which measure up to 7 mm. Gallbladder wall thickening is noted measuring 3.5 mm. Common bile duct: Diameter: 4 mm.  No intrahepatic bile duct dilatation. Liver: The liver appears heterogeneous with a nodular contour compatible with cirrhosis. There is a  small iso to hypoechoic structure within the right lobe of liver measuring 2.6 x 1.8 x 1.7 cm. Portal vein is patent on color Doppler imaging with normal direction of blood flow towards the liver. Other: None. IMPRESSION: 1. The gallbladder is distended by sludge and multiple stones. Gallbladder wall thickening is noted, which in the setting of underlying cirrhosis may be a nonspecific finding. If there is clinical concern for acute cholecystitis consider nuclear medicine HIDA scan. 2. Morphologic features of the liver compatible with cirrhosis. 3. Indeterminate hypoechoic structure within the right lobe of liver measuring 2.6 cm. This may represent a small hepatic adenoma or focal nodular hyperplasia. On the recent MRI of the abdomen several indeterminate lesions were identified for which follow-up  imaging is advised in 6 months. Electronically Signed   By: Kerby Moors M.D.   On: 04/16/2022 15:04   CT ABDOMEN PELVIS W CONTRAST  Result Date: 04/16/2022 CLINICAL DATA:  Abdominal pain EXAM: CT ABDOMEN AND PELVIS WITH CONTRAST TECHNIQUE: Multidetector CT imaging of the abdomen and pelvis was performed using the standard protocol following bolus administration of intravenous contrast. RADIATION DOSE REDUCTION: This exam was performed according to the departmental dose-optimization program which includes automated exposure control, adjustment of the mA and/or kV according to patient size and/or use of iterative reconstruction technique. CONTRAST:  164mL OMNIPAQUE IOHEXOL 300 MG/ML  SOLN COMPARISON:  Abdominal MRI dated February 20, 2022 FINDINGS: Lower chest: No acute abnormality. Hepatobiliary: Cirrhotic liver morphology. Small lesions of the right hepatic lobe described on prior MRI not well visualized, likely due to contrast timing. Recanalized paraumbilical vein. Gallbladder wall thickening with stones and hyperdense intraluminal contents. Focal area of hyperdensity measuring 8 mm on series 2, image 26 with similar attenuation to aortic blood pool, increases in size on delayed imaging. No biliary ductal dilation. Pancreas: Unremarkable. No pancreatic ductal dilatation or surrounding inflammatory changes. Spleen: Splenomegaly, measuring up to 17.0 cm. Adrenals/Urinary Tract: Bilateral adrenal glands are unremarkable. No hydronephrosis or nephrolithiasis. Stomach/Bowel: Stomach is within normal limits. Appendix appears normal. No evidence of bowel wall thickening, distention, or inflammatory changes. Vascular/Lymphatic: Mild aortic atherosclerosis. Splenorenal varices. No enlarged abdominal or pelvic lymph nodes. Reproductive: Prostate is unremarkable. Other: No abdominal wall hernia or abnormality. No abdominopelvic ascites. Musculoskeletal: No acute or significant osseous findings. IMPRESSION: 1.  Gallbladder wall thickening with stones and hyperdense intraluminal contents, findings are concerning for hemorrhagic cholecystitis. There is in intraluminal hyperdense focus which increases in size on delayed imaging, concerning for intraluminal active bleeding. 2. Cirrhotic liver morphology with evidence of portal hypertension including splenomegaly and varices. 3. Small lesions of the right hepatic lobe described on prior MRI not well visualized, likely due to contrast timing. 4. Aortic Atherosclerosis (ICD10-I70.0). Critical Value/emergent results were called by telephone at the time of interpretation on 04/16/2022 at 1:18 pm to provider [facility] , who verbally acknowledged these results. Electronically Signed   By: Yetta Glassman M.D.   On: 04/16/2022 13:19    Assessment and Plan:   Tyden Kann is a 46 y.o. y/o male has a history of alcoholic liver and alpha 1 antitrypsin deficiency related cirrhosis, decompensation with ascites presented to the ER in October 2022 with abdominal distention and acute liver failure, non bleeding esophageal varices that have been banded.    Concern for masslike lesion on ultrasound but no mass seen on MRI but AFP has been elevated, follow-up with Dr. Manuella Ghazi at [facility] biopsy was benign. Did not tolerate 100 mg of Aldactone and 40 mg  of Lasix as he developed gynecomastia, on eplerenone 100 mg and Lasix 40 mg positive tissue transglutaminase antibody but negative biopsies of the duodenum for celiac disease.  Tests for autoimmune liver disease was negative.  Elevated ferritin, very low alpha-1 antitrypsin levels, persistently elevated AFP, no recent episodes of hepatic encephalopathy.  Developed hemorrhagic cholecystitis on 04/16/2022 underwent cholecystectomy since then developed ascites, the ascites appears clear.  He did receive a normal saline as he had an intraoperative complete likely related to that he has developed ascites.  He is on a low-sodium diet.  Explained to  him that we should give him some albumin as an infusion to prevent hepatorenal syndrome in addition check his labs and advised him to check his daily weights with, maintain a record of fluid being drained   Plan 1.  No NSAIDs, check cbc, cmp,INR 2.  On eplerenone 100 mg a day and Lasix 40 mg a day to continue, based on lab work we will have him discuss the results with Dr. Manuella Ghazi who he is going to see on Monday to uptitrate the medications 4.  Low-sodium diet very compliant 5.  MRI liver shows stable liver mass repeat in February 2024 orders placed 6.  EGD for surveillance of esophageal varices August 2024 7.  Continue to stay off all alcohol: Has been in remission for many months and doing very well sarcopenia has improved and he has normal-appearing muscle mass 8.  Complete hepatitis B vaccination series with his primary care physician received 2 or 3 doses 9.  Maintain a chart with daily weights, amount of fluid drained and compliance to low-sodium diet.  I expect once his ascites which is in equilibrium with the medications and his sodium load is terminated we need to gradually decrease the dose of his Lasix and eplerenone   Dr [name]  MD,MRCP [facility]) Follow up in 1 week video visit

## 2022-05-01 ENCOUNTER — Other Ambulatory Visit: Payer: Self-pay

## 2022-05-01 ENCOUNTER — Ambulatory Visit
Admission: RE | Admit: 2022-05-01 | Discharge: 2022-05-01 | Disposition: A | Discharge status: Home / Self Care | Payer: 59 | Source: Ambulatory Visit | Attending: Gastroenterology | Admitting: Gastroenterology

## 2022-05-01 DIAGNOSIS — K651 Peritoneal abscess: Secondary | ICD-10-CM | POA: Diagnosis not present

## 2022-05-01 DIAGNOSIS — K7460 Unspecified cirrhosis of liver: Secondary | ICD-10-CM | POA: Insufficient documentation

## 2022-05-01 DIAGNOSIS — R188 Other ascites: Secondary | ICD-10-CM | POA: Insufficient documentation

## 2022-05-01 DIAGNOSIS — T8143XA Infection following a procedure, organ and space surgical site, initial encounter: Secondary | ICD-10-CM | POA: Diagnosis not present

## 2022-05-01 LAB — COMPREHENSIVE METABOLIC PANEL
ALT: 104 IU/L — ABNORMAL HIGH (ref 0–44)
AST: 116 IU/L — ABNORMAL HIGH (ref 0–40)
Albumin/Globulin Ratio: 1.1 — ABNORMAL LOW (ref 1.2–2.2)
Albumin: 3.4 g/dL — ABNORMAL LOW (ref 4.1–5.1)
Alkaline Phosphatase: 379 IU/L — ABNORMAL HIGH (ref 44–121)
BUN/Creatinine Ratio: 19 (ref 9–20)
BUN: 15 mg/dL (ref 6–24)
Bilirubin Total: 1.9 mg/dL — ABNORMAL HIGH (ref 0.0–1.2)
CO2: 24 mmol/L (ref 20–29)
Calcium: 8.6 mg/dL — ABNORMAL LOW (ref 8.7–10.2)
Chloride: 96 mmol/L (ref 96–106)
Creatinine, Ser: 0.78 mg/dL (ref 0.76–1.27)
Globulin, Total: 3.2 g/dL (ref 1.5–4.5)
Glucose: 121 mg/dL — ABNORMAL HIGH (ref 70–99)
Potassium: 4.5 mmol/L (ref 3.5–5.2)
Sodium: 131 mmol/L — ABNORMAL LOW (ref 134–144)
Total Protein: 6.6 g/dL (ref 6.0–8.5)
eGFR: 111 mL/min/{1.73_m2} (ref >59)

## 2022-05-01 LAB — CBC WITH DIFFERENTIAL/PLATELET
Basophils Absolute: 0.1 10*3/uL (ref 0.0–0.2)
Basos: 0 %
EOS (ABSOLUTE): 0.1 10*3/uL (ref 0.0–0.4)
Eos: 1 %
Hematocrit: 45.9 % (ref 37.5–51.0)
Hemoglobin: 15.6 g/dL (ref 13.0–17.7)
Immature Grans (Abs): 0.1 10*3/uL (ref 0.0–0.1)
Immature Granulocytes: 1 %
Lymphocytes Absolute: 1.8 10*3/uL (ref 0.7–3.1)
Lymphs: 15 %
MCH: 30 pg (ref 26.6–33.0)
MCHC: 34 g/dL (ref 31.5–35.7)
MCV: 88 fL (ref 79–97)
Monocytes Absolute: 1.1 10*3/uL — ABNORMAL HIGH (ref 0.1–0.9)
Monocytes: 9 %
Neutrophils Absolute: 8.4 10*3/uL — ABNORMAL HIGH (ref 1.4–7.0)
Neutrophils: 74 %
Platelets: 201 10*3/uL (ref 150–450)
RBC: 5.2 x10E6/uL (ref 4.14–5.80)
RDW: 12.9 % (ref 11.6–15.4)
WBC: 11.5 10*3/uL — ABNORMAL HIGH (ref 3.4–10.8)

## 2022-05-01 LAB — PROTIME-INR
INR: 1.3 — ABNORMAL HIGH (ref 0.9–1.2)
Prothrombin Time: 13.6 s — ABNORMAL HIGH (ref 9.1–12.0)

## 2022-05-01 MED ORDER — ALBUMIN HUMAN 25 % IV SOLN
50.0000 g | Freq: Once | INTRAVENOUS | Status: AC
  Administered 2022-05-01: 50 g via INTRAVENOUS
  Filled 2022-05-01: qty 200

## 2022-05-01 NOTE — Progress Notes (Signed)
Inform LFT's are similar to 11 days back - albumin looks better and creatinine is stable.  He can discuss with Dr Manuella Ghazi and either he can increase dose of water pills or I can , continue to monitor weights and drainage with updates

## 2022-05-02 ENCOUNTER — Inpatient Hospital Stay
Admission: EM | Admit: 2022-05-02 | Discharge: 2022-05-04 | DRG: 862 | Disposition: A | Discharge status: Home / Self Care | Payer: 59 | Attending: Internal Medicine | Admitting: Internal Medicine

## 2022-05-02 ENCOUNTER — Encounter: Payer: Self-pay | Admitting: Emergency Medicine

## 2022-05-02 ENCOUNTER — Other Ambulatory Visit: Payer: Self-pay

## 2022-05-02 ENCOUNTER — Emergency Department: Payer: 59

## 2022-05-02 DIAGNOSIS — K7210 Chronic hepatic failure without coma: Secondary | ICD-10-CM | POA: Diagnosis present

## 2022-05-02 DIAGNOSIS — Z87891 Personal history of nicotine dependence: Secondary | ICD-10-CM | POA: Diagnosis not present

## 2022-05-02 DIAGNOSIS — Z9049 Acquired absence of other specified parts of digestive tract: Secondary | ICD-10-CM | POA: Diagnosis not present

## 2022-05-02 DIAGNOSIS — E8801 Alpha-1-antitrypsin deficiency: Secondary | ICD-10-CM | POA: Diagnosis present

## 2022-05-02 DIAGNOSIS — Z7682 Awaiting organ transplant status: Secondary | ICD-10-CM | POA: Diagnosis not present

## 2022-05-02 DIAGNOSIS — A419 Sepsis, unspecified organism: Secondary | ICD-10-CM | POA: Diagnosis present

## 2022-05-02 DIAGNOSIS — I10 Essential (primary) hypertension: Secondary | ICD-10-CM | POA: Diagnosis present

## 2022-05-02 DIAGNOSIS — L0291 Cutaneous abscess, unspecified: Secondary | ICD-10-CM | POA: Diagnosis not present

## 2022-05-02 DIAGNOSIS — T8143XA Infection following a procedure, organ and space surgical site, initial encounter: Secondary | ICD-10-CM | POA: Diagnosis present

## 2022-05-02 DIAGNOSIS — Y838 Other surgical procedures as the cause of abnormal reaction of the patient, or of later complication, without mention of misadventure at the time of the procedure: Secondary | ICD-10-CM | POA: Diagnosis present

## 2022-05-02 DIAGNOSIS — K7030 Alcoholic cirrhosis of liver without ascites: Secondary | ICD-10-CM | POA: Diagnosis present

## 2022-05-02 DIAGNOSIS — K651 Peritoneal abscess: Secondary | ICD-10-CM | POA: Diagnosis present

## 2022-05-02 DIAGNOSIS — G8929 Other chronic pain: Secondary | ICD-10-CM | POA: Diagnosis present

## 2022-05-02 DIAGNOSIS — Z86010 Personal history of colonic polyps: Secondary | ICD-10-CM

## 2022-05-02 DIAGNOSIS — R1084 Generalized abdominal pain: Secondary | ICD-10-CM | POA: Diagnosis not present

## 2022-05-02 DIAGNOSIS — K7460 Unspecified cirrhosis of liver: Secondary | ICD-10-CM | POA: Diagnosis present

## 2022-05-02 DIAGNOSIS — R109 Unspecified abdominal pain: Secondary | ICD-10-CM | POA: Diagnosis present

## 2022-05-02 DIAGNOSIS — R188 Other ascites: Secondary | ICD-10-CM | POA: Diagnosis not present

## 2022-05-02 LAB — CBC
HCT: 44.1 % (ref 39.0–52.0)
Hemoglobin: 15.3 g/dL (ref 13.0–17.0)
MCH: 30.6 pg (ref 26.0–34.0)
MCHC: 34.7 g/dL (ref 30.0–36.0)
MCV: 88.2 fL (ref 80.0–100.0)
Platelets: 174 10*3/uL (ref 150–400)
RBC: 5 MIL/uL (ref 4.22–5.81)
RDW: 14 % (ref 11.5–15.5)
WBC: 13.2 10*3/uL — ABNORMAL HIGH (ref 4.0–10.5)
nRBC: 0 % (ref 0.0–0.2)

## 2022-05-02 LAB — COMPREHENSIVE METABOLIC PANEL
ALT: 75 U/L — ABNORMAL HIGH (ref 0–44)
AST: 88 U/L — ABNORMAL HIGH (ref 15–41)
Albumin: 3.7 g/dL (ref 3.5–5.0)
Alkaline Phosphatase: 270 U/L — ABNORMAL HIGH (ref 38–126)
Anion gap: 6 (ref 5–15)
BUN: 16 mg/dL (ref 6–20)
CO2: 22 mmol/L (ref 22–32)
Calcium: 8.6 mg/dL — ABNORMAL LOW (ref 8.9–10.3)
Chloride: 104 mmol/L (ref 98–111)
Creatinine, Ser: 0.59 mg/dL — ABNORMAL LOW (ref 0.61–1.24)
GFR, Estimated: >60 mL/min (ref >60)
Glucose, Bld: 123 mg/dL — ABNORMAL HIGH (ref 70–99)
Potassium: 4.6 mmol/L (ref 3.5–5.1)
Sodium: 132 mmol/L — ABNORMAL LOW (ref 135–145)
Total Bilirubin: 1.8 mg/dL — ABNORMAL HIGH (ref 0.3–1.2)
Total Protein: 7.1 g/dL (ref 6.5–8.1)

## 2022-05-02 LAB — URINALYSIS, ROUTINE W REFLEX MICROSCOPIC
Bilirubin Urine: NEGATIVE
Glucose, UA: NEGATIVE mg/dL
Hgb urine dipstick: NEGATIVE
Ketones, ur: NEGATIVE mg/dL
Leukocytes,Ua: NEGATIVE
Nitrite: NEGATIVE
Protein, ur: NEGATIVE mg/dL
Specific Gravity, Urine: 1.021 (ref 1.005–1.030)
pH: 6 (ref 5.0–8.0)

## 2022-05-02 LAB — LACTIC ACID, PLASMA: Lactic Acid, Venous: 1 mmol/L (ref 0.5–1.9)

## 2022-05-02 LAB — LIPASE, BLOOD: Lipase: 70 U/L — ABNORMAL HIGH (ref 11–51)

## 2022-05-02 MED ORDER — HYDROMORPHONE HCL 1 MG/ML IJ SOLN
1.0000 mg | INTRAMUSCULAR | Status: DC | PRN
  Administered 2022-05-03 (×2): 1 mg via INTRAVENOUS
  Filled 2022-05-02 (×3): qty 1

## 2022-05-02 MED ORDER — PIPERACILLIN-TAZOBACTAM 3.375 G IVPB 30 MIN
3.3750 g | Freq: Once | INTRAVENOUS | Status: AC
  Administered 2022-05-02: 3.375 g via INTRAVENOUS
  Filled 2022-05-02: qty 50

## 2022-05-02 MED ORDER — ONDANSETRON HCL 4 MG PO TABS: 4.0000 mg | ORAL_TABLET | Freq: Four times a day (QID) | ORAL | Status: DC | PRN

## 2022-05-02 MED ORDER — HYDROMORPHONE HCL 1 MG/ML PO LIQD: 1.0000 mg | Freq: Four times a day (QID) | ORAL | Status: DC | PRN

## 2022-05-02 MED ORDER — ACETAMINOPHEN 650 MG RE SUPP: 650.0000 mg | Freq: Four times a day (QID) | RECTAL | Status: DC | PRN

## 2022-05-02 MED ORDER — PIPERACILLIN-TAZOBACTAM 3.375 G IVPB
3.3750 g | Freq: Three times a day (TID) | INTRAVENOUS | Status: DC
  Administered 2022-05-03 – 2022-05-04 (×4): 3.375 g via INTRAVENOUS
  Filled 2022-05-02 (×4): qty 50

## 2022-05-02 MED ORDER — MORPHINE SULFATE (PF) 4 MG/ML IV SOLN
4.0000 mg | Freq: Once | INTRAVENOUS | Status: AC
  Administered 2022-05-02: 4 mg via INTRAVENOUS
  Filled 2022-05-02: qty 1

## 2022-05-02 MED ORDER — MORPHINE SULFATE (PF) 2 MG/ML IV SOLN
2.0000 mg | Freq: Once | INTRAVENOUS | Status: AC
  Administered 2022-05-02: 2 mg via INTRAVENOUS
  Filled 2022-05-02: qty 1

## 2022-05-02 MED ORDER — ONDANSETRON HCL 4 MG/2ML IJ SOLN
4.0000 mg | INTRAMUSCULAR | Status: AC
  Administered 2022-05-02: 4 mg via INTRAVENOUS
  Filled 2022-05-02: qty 2

## 2022-05-02 MED ORDER — HYDROMORPHONE HCL 1 MG/ML IJ SOLN
INTRAMUSCULAR | Status: AC
  Administered 2022-05-02: 1 mg via INTRAVENOUS
  Filled 2022-05-02: qty 1

## 2022-05-02 MED ORDER — ACETAMINOPHEN 325 MG PO TABS: 650.0000 mg | ORAL_TABLET | Freq: Four times a day (QID) | ORAL | Status: DC | PRN

## 2022-05-02 MED ORDER — IOHEXOL 300 MG/ML  SOLN
100.0000 mL | Freq: Once | INTRAMUSCULAR | Status: AC | PRN
  Administered 2022-05-02: 100 mL via INTRAVENOUS

## 2022-05-02 MED ORDER — LACTATED RINGERS IV SOLN: INTRAVENOUS | Status: DC

## 2022-05-02 MED ORDER — ONDANSETRON HCL 4 MG/2ML IJ SOLN: 4.0000 mg | Freq: Four times a day (QID) | INTRAMUSCULAR | Status: DC | PRN

## 2022-05-02 NOTE — ED Notes (Signed)
Pt's pain was momentarily relieved by first '4mg'$  morphine. Pt began having severe pain again, gave additional '2mg'$ . CT notified that pt has received pain meds.

## 2022-05-02 NOTE — H&P (Signed)
History and Physical    Chief Complaint: Fever    HISTORY OF PRESENT ILLNESS: Brian Vasquez is an 46 y.o. male fevers of 101.4.   Patient follows up with GI Dr. Vicente Males chart review shows that patient had visit for endoscopy on February 23, 2022 for evaluation of esophageal varices as he has history of cirrhosis from 1 antitrypsin deficiency and prolonged imaging of alcohol.  Hypertensive gastropathy examined stomach, esophagus was normal, cardia and gastric fundus were normal.  On April 15, 2022 patient had a screening colonoscopy where he had two 4 to 5 mm polyps removed with a cold snare in the ascending colon.  Today after the colonoscopy the patient came to the emergency room on April 16, 2022 with complaints of upper abdominal pain that was sudden in onset with nausea-CT imaging of the visit showed gallbladder wall thickening with stones and hyperdense intraluminal contents concerning for hemorrhagic cholecystitis.  Hospitalist was requested to admit patient at general surgery was consulted but at that visit patient was seen by Dr. Dahlia Byes and due to hemorrhagic nature patient underwent robotic assisted laparoscopic cholecystectomy.  The patient was found to have severe hemorrhagic cholecystitis with a massive clot inside the gallbladder, friable and oozing liver tissues due to advanced cirrhosis and portal hypertension patient was transfused blood and platelets due to the bleeding.  Postoperatively patient did develop an ileus and patient was started on empiric IV antibiotics.  Per general surgeon if the patient's bilirubin continues to rise patient may need a HIDA scan.  Patient was discharged with a biliary drain per general surgery. Today in the emergency room general surgery was consulted Dr. Christian Mate on-call consult requested to see patient.    Pt has PMH as below: Past Medical History:  Diagnosis Date   Alcohol abuse 63/78/5885   Alcoholic cirrhosis of liver with ascites (HCC)     Alpha-1-antitrypsin deficiency (HCC)    Cirrhosis ([place])    Esophageal varices (HCC)    ETOH abuse    Hypertension    Hyponatremia 05/10/2021  Review of Systems  Constitutional:  Positive for chills, diaphoresis, fatigue and fever.  Gastrointestinal:  Positive for abdominal pain and nausea.  All other systems reviewed and are negative. No Known Allergies Past Surgical History:  Procedure Laterality Date   CHOLECYSTECTOMY  04/16/2022   COLONOSCOPY WITH PROPOFOL N/A 04/15/2022   Procedure: COLONOSCOPY WITH PROPOFOL;  Surgeon: [name], MD;  Location: [facility] ENDOSCOPY;  Service: Gastroenterology;  Laterality: N/A;   ESOPHAGOGASTRODUODENOSCOPY (EGD) WITH PROPOFOL N/A 05/12/2021   Procedure: ESOPHAGOGASTRODUODENOSCOPY (EGD) WITH PROPOFOL;  Surgeon: [name], MD;  Location: [facility] ENDOSCOPY;  Service: Gastroenterology;  Laterality: N/A;   ESOPHAGOGASTRODUODENOSCOPY (EGD) WITH PROPOFOL N/A 07/08/2021   Procedure: ESOPHAGOGASTRODUODENOSCOPY (EGD) WITH PROPOFOL;  Surgeon: [name], MD;  Location: [facility] ENDOSCOPY;  Service: Gastroenterology;  Laterality: N/A;   ESOPHAGOGASTRODUODENOSCOPY (EGD) WITH PROPOFOL N/A 08/20/2021   Procedure: ESOPHAGOGASTRODUODENOSCOPY (EGD) WITH PROPOFOL;  Surgeon: [name], MD;  Location: [facility] ENDOSCOPY;  Service: Gastroenterology;  Laterality: N/A;   ESOPHAGOGASTRODUODENOSCOPY (EGD) WITH PROPOFOL N/A 02/23/2022   Procedure: ESOPHAGOGASTRODUODENOSCOPY (EGD) WITH PROPOFOL;  Surgeon: [name], MD;  Location: [facility] ENDOSCOPY;  Service: Gastroenterology;  Laterality: N/A;   Social History   Socioeconomic History   Marital status: Divorced    Spouse name: Not on file   Number of children: Not on file   Years of education: Not on file   Highest education level: Not on file  Occupational History   Not on file  Tobacco Use  Smoking status: Former    Types: Cigarettes   Smokeless tobacco: Never  Vaping Use   Vaping Use: Never used  Substance and Sexual  Activity   Alcohol use: Not Currently   Drug use: Not Currently   Sexual activity: Not on file  Other Topics Concern   Not on file  Social History Narrative   Not on file   Social Determinants of Health   Financial Resource Strain: Not on file  Food Insecurity: No Food Insecurity (04/17/2022)   Hunger Vital Sign    Worried About Running Out of Food in the Last Year: Never true    Ran Out of Food in the Last Year: Never true  Transportation Needs: No Transportation Needs (04/17/2022)   PRAPARE - Hydrologist (Medical): No    Lack of Transportation (Non-Medical): No  Physical Activity: Not on file  Stress: Not on file  Social Connections: Not on file     CURRENT MEDS: Current Facility-Administered Medications (Analgesics):    acetaminophen (TYLENOL) tablet 650 mg **OR** acetaminophen (TYLENOL) suppository 650 mg   HYDROmorphone (DILAUDID) injection 1 mg Current Facility-Administered Medications (Other):    lactated ringers infusion   ondansetron (ZOFRAN) tablet 4 mg **OR** ondansetron (ZOFRAN) injection 4 mg   piperacillin-tazobactam (ZOSYN) IVPB 3.375 g  No current outpatient medications on file.   ED Course: Pt in Ed pt is alert/ meet sepsis criteria lactic acid is pending.  Pt given  zosyn in ed and he is in pain and is in good spirits.   Vitals:   05/02/22 2035 05/02/22 2303 05/03/22 0105 05/03/22 0125  BP: 134/88 131/84 112/76 (!) 136/90  Pulse: 99 94 91 85  Resp: '16 20 18 20  '$ Temp: 99.7 F (37.6 C) 99.8 F (37.7 C) 98.7 F (37.1 C) 98.5 F (36.9 C)  TempSrc: Oral Oral Oral Oral  SpO2: 94% 93% 93% 99%  Weight:    85 kg  Height:    '5\' 10"'$  (1.778 m)   Total I/O In: 50 [IV Piggyback:50] Out: -  SpO2: 99 % Blood work in ed shows elevated lipase/ LFT/ electrolytes abnormality.  Results for orders placed or performed during the hospital encounter of 05/02/22 (from the past 48 hour(s))  Lipase, blood     Status: Abnormal    Collection Time: 05/02/22  4:03 PM  Result Value Ref Range   Lipase 70 (H) 11 - 51 U/L    Comment: Performed at [facility], [address] [place], [place] [address]  Comprehensive metabolic panel     Status: Abnormal   Collection Time: 05/02/22  4:03 PM  Result Value Ref Range   Sodium 132 (L) 135 - 145 mmol/L   Potassium 4.6 3.5 - 5.1 mmol/L    Comment: HEMOLYSIS AT THIS LEVEL MAY AFFECT RESULT   Chloride 104 98 - 111 mmol/L   CO2 22 22 - 32 mmol/L   Glucose, Bld 123 (H) 70 - 99 mg/dL    Comment: Glucose reference range applies only to samples taken after fasting for at least 8 hours.   BUN 16 6 - 20 mg/dL   Creatinine, Ser 0.59 (L) 0.61 - 1.24 mg/dL   Calcium 8.6 (L) 8.9 - 10.3 mg/dL   Total Protein 7.1 6.5 - 8.1 g/dL   Albumin 3.7 3.5 - 5.0 g/dL   AST 88 (H) 15 - 41 U/L   ALT 75 (H) 0 - 44 U/L   Alkaline Phosphatase 270 (H) 38 -  126 U/L   Total Bilirubin 1.8 (H) 0.3 - 1.2 mg/dL   GFR, Estimated >60 >60 mL/min    Comment: (NOTE) Calculated using the CKD-EPI Creatinine Equation (2021)    Anion gap 6 5 - 15    Comment: Performed at [facility], [address] [place], [place] [address]  CBC     Status: Abnormal   Collection Time: 05/02/22  4:03 PM  Result Value Ref Range   WBC 13.2 (H) 4.0 - 10.5 K/uL   RBC 5.00 4.22 - 5.81 MIL/uL   Hemoglobin 15.3 13.0 - 17.0 g/dL   HCT 44.1 39.0 - 52.0 %   MCV 88.2 80.0 - 100.0 fL   MCH 30.6 26.0 - 34.0 pg   MCHC 34.7 30.0 - 36.0 g/dL   RDW 14.0 11.5 - 15.5 %   Platelets 174 150 - 400 K/uL   nRBC 0.0 0.0 - 0.2 %    Comment: Performed at [facility], [address] [place], [place] [address]  Urinalysis, Routine w reflex microscopic     Status: Abnormal   Collection Time: 05/02/22  4:03 PM  Result Value Ref Range   Color, Urine AMBER (A) YELLOW    Comment: BIOCHEMICALS MAY BE AFFECTED BY COLOR   APPearance HAZY (A) CLEAR   Specific Gravity, Urine 1.021 1.005 - 1.030   pH 6.0 5.0 - 8.0    Glucose, UA NEGATIVE NEGATIVE mg/dL   Hgb urine dipstick NEGATIVE NEGATIVE   Bilirubin Urine NEGATIVE NEGATIVE   Ketones, ur NEGATIVE NEGATIVE mg/dL   Protein, ur NEGATIVE NEGATIVE mg/dL   Nitrite NEGATIVE NEGATIVE   Leukocytes,Ua NEGATIVE NEGATIVE    Comment: Performed at [facility], [address] [place], [place] [address]  Lactic acid, plasma     Status: None   Collection Time: 05/02/22  6:00 PM  Result Value Ref Range   Lactic Acid, Venous 1.0 0.5 - 1.9 mmol/L    Comment: Performed at [facility], [address] [place], [place] [address]  Body fluid culture w Gram Stain     Status: None (Preliminary result)   Collection Time: 05/02/22  8:08 PM   Specimen: JP Drain; Body Fluid  Result Value Ref Range   Specimen Description      JP DRAINAGE FLUID Performed at [facility], [address] [place], [place] [address]    Special Requests      Normal Performed at [facility], [place], [place] [address]    Gram Stain      FEW WBC PRESENT,BOTH PMN AND MONONUCLEAR RARE GRAM POSITIVE COCCI IN CLUSTERS Performed at [facility], [place] [address] [place],  [address]    Culture PENDING    Report Status PENDING     In Ed pt received  Meds ordered this encounter  Medications   morphine (PF) 4 MG/ML injection 4 mg   ondansetron (ZOFRAN) injection 4 mg   morphine (PF) 2 MG/ML injection 2 mg   iohexol (OMNIPAQUE) 300 MG/ML solution 100 mL   morphine (PF) 2 MG/ML injection 2 mg   piperacillin-tazobactam (ZOSYN) IVPB 3.375 g    Order Specific Question:   Antibiotic Indication:    Answer:   Intra-abdominal Infection   piperacillin-tazobactam (ZOSYN) IVPB 3.375 g    Order Specific Question:   Antibiotic Indication:    Answer:   Sepsis   lactated ringers infusion   OR Linked Order Group    acetaminophen (TYLENOL)  tablet 650 mg    acetaminophen (TYLENOL) suppository 650 mg   OR Linked Order Group    ondansetron  (ZOFRAN) tablet 4 mg    ondansetron (ZOFRAN) injection 4 mg   DISCONTD: HYDROmorphone HCl (DILAUDID) liquid 1 mg   HYDROmorphone (DILAUDID) injection 1 mg   HYDROmorphone (DILAUDID) 1 MG/ML injection    Merkle, Rachel: cabinet override    [employer] (From admission, onward)     Start     Ordered   05/03/22 0500  Protime-INR  Tomorrow morning,   STAT        05/02/22 2007   05/03/22 0500  Procalcitonin  Tomorrow morning,   URGENT        05/02/22 2007   05/03/22 0500  CBC  Tomorrow morning,   STAT        05/02/22 2007   05/03/22 0500  Comprehensive metabolic panel  Tomorrow morning,   STAT        05/02/22 2007   05/02/22 2030  Culture, blood (Routine X 2) w Reflex to ID Panel  Once,   STAT        05/02/22 2030   05/02/22 2002  HIV Antibody (routine testing w rflx)  (HIV Antibody (Routine testing w reflex) panel)  Once,   URGENT        05/02/22 2007   05/02/22 1857  Hemoglobin A1c  Add-on,   AD        05/02/22 1856   05/02/22 1806  Blood culture (routine x 2)  BLOOD CULTURE X 2,   STAT      05/02/22 1805             Admission Imaging : CT ABDOMEN PELVIS W CONTRAST  Result Date: 05/02/2022 CLINICAL DATA:  Right-sided abdominal pain. EXAM: CT ABDOMEN AND PELVIS WITH CONTRAST TECHNIQUE: Multidetector CT imaging of the abdomen and pelvis was performed using the standard protocol following bolus administration of intravenous contrast. RADIATION DOSE REDUCTION: This exam was performed according to the departmental dose-optimization program which includes automated exposure control, adjustment of the mA and/or kV according to patient size and/or use of iterative reconstruction technique. CONTRAST:  168m OMNIPAQUE IOHEXOL 300 MG/ML  SOLN COMPARISON:  04/16/2022 FINDINGS: Lower chest: Dependent atelectasis noted in the lung bases. Hepatobiliary: Nodular liver contour compatible with the reported clinical history of cirrhosis. 6.0 x 3.2 x 5.2 cm relatively well-defined fluid collection  is identified in the gallbladder fossa. Fluid collection shows subtle peripheral/rim enhancement. Amorphous areas of increased attenuation within this fluid likely represent packing material although hemorrhagic debris or drop stones cannot be entirely excluded. Fluid collection tracks posteriorly and inferiorly along the capsule of the liver in Morison's pouch. No intrahepatic or extrahepatic biliary dilation. Pancreas: No focal mass lesion. No dilatation of the main duct. No intraparenchymal cyst. No peripancreatic edema. Spleen: Spleen is enlarged. Adrenals/Urinary Tract: No adrenal nodule or mass. Kidneys unremarkable. No evidence for hydroureter. The urinary bladder appears normal for the degree of distention. Stomach/Bowel: Stomach is unremarkable. No gastric wall thickening. No evidence of outlet obstruction. Duodenum is normally positioned as is the ligament of Treitz. Jejunal loops in fossa. The left upper quadrant for mildly distended and filled with fluid. Pattern is nonobstructive. Ileal loops are decompressed. The terminal ileum is normal. The appendix is normal. Mild wall thickening in the hepatic flexure of the colon is likely secondary. Left colon unremarkable. Vascular/Lymphatic: No abdominal aortic aneurysm. Portal vein is patent. Recanalization of the paraumbilical vein is consistent with portal  venous hypertension. Paraesophageal varices noted. No abdominal aortic atherosclerotic calcification. Scattered small retroperitoneal lymph nodes evident. No pelvic sidewall lymphadenopathy. Reproductive: The prostate gland and seminal vesicles are unremarkable. Other: Trace free fluid is seen in the pelvis. Edema/inflammation is seen in the right upper quadrant. Surgical drain is seen along the inferior aspect of the right liver tracking up towards the gallbladder Musculoskeletal: No worrisome lytic or sclerotic osseous abnormality. IMPRESSION: 1. 6.0 x 3.2 x 5.2 cm relatively well-defined fluid  collection in the gallbladder fossa. Fluid collection shows subtle peripheral/rim enhancement. Amorphous areas of increased attenuation within this fluid likely represent packing material although hemorrhagic debris or dropped stones cannot be entirely excluded. Evolving abscess is a concern. Fluid collection tracks posteriorly and inferiorly along the capsule of the liver into Morison's pouch. 2. Cirrhotic liver with evidence of portal venous hypertension including splenomegaly, recanalization of the paraumbilical vein and paraesophageal varices. 3. Trace free fluid in the pelvis. 4. Mild wall thickening in the hepatic flexure of the colon is likely secondary. Electronically Signed   By: Misty Stanley M.D.   On: 05/02/2022 17:52      Physical Examination: Vitals:   05/02/22 2035 05/02/22 2303 05/03/22 0105 05/03/22 0125  BP: 134/88 131/84 112/76 (!) 136/90  Pulse: 99 94 91 85  Temp: 99.7 F (37.6 C) 99.8 F (37.7 C) 98.7 F (37.1 C) 98.5 F (36.9 C)  Resp: '16 20 18 20  '$ Height:    '5\' 10"'$  (1.778 m)  Weight:    85 kg  SpO2: 94% 93% 93% 99%  TempSrc: Oral Oral Oral Oral  BMI (Calculated):    26.89   Physical Exam Vitals and nursing note reviewed.  Constitutional:      General: He is not in acute distress.    Appearance: Normal appearance. He is not ill-appearing, toxic-appearing or diaphoretic.  HENT:     Head: Normocephalic and atraumatic.     Right Ear: Hearing and external ear normal.     Left Ear: Hearing and external ear normal.     Nose: Nose normal. No nasal deformity.     Mouth/Throat:     Lips: Pink.     Mouth: Mucous membranes are moist.     Tongue: No lesions.     Pharynx: Oropharynx is clear.  Eyes:     Extraocular Movements: Extraocular movements intact.     Pupils: Pupils are equal, round, and reactive to light.  Cardiovascular:     Rate and Rhythm: Normal rate and regular rhythm.     Pulses: Normal pulses.     Heart sounds: Normal heart sounds.  Pulmonary:      Effort: Pulmonary effort is normal.     Breath sounds: Normal breath sounds.  Abdominal:     General: Bowel sounds are normal. There is distension.     Palpations: Abdomen is soft. There is no mass.     Tenderness: There is abdominal tenderness in the right upper quadrant, epigastric area and periumbilical area. There is guarding.     Hernia: No hernia is present.  Musculoskeletal:     Right lower leg: No edema.     Left lower leg: No edema.  Skin:    General: Skin is warm.  Neurological:     General: No focal deficit present.     Mental Status: He is alert and oriented to person, place, and time.     Cranial Nerves: Cranial nerves 2-12 are intact.     Motor: Motor function is  intact.  Psychiatric:        Attention and Perception: Attention normal.        Mood and Affect: Mood normal.        Speech: Speech normal.        Behavior: Behavior normal. Behavior is cooperative.        Cognition and Memory: Cognition normal.      Assessment and Plan: * Abdominal pain Attribute to collection ? Abscess/ Blood or both. We will continue with iv abx and npo. PRN pain meds. Is unclear if the drain is malpositioned or blocked or if patient needs to have IR cyst and drainage there is a leak we are treating. Discussed with patient's these differentials and that we will start with cardiology consult as right-sided chest pain can be due to heart disease as well.    Sepsis ([place]) Pt meets sepsis criteria. Blood pressure 119/86, pulse (!) 109, temperature 99.7 F (37.6 C), temperature source Oral, resp. rate 16, height '5\' 10"'$  (1.778 m), weight 83.9 kg, SpO2 96 %.   We will cont iv abx  And follow c/s.    Abscess Abscess in fossa extending to liver capsule we will appreciate general surgery consult.   Alpha-1-antitrypsin deficiency ([place]) Pt being followed at [facility].    Liver cirrhosis (HCC) A1ATD Cirrhosis: -MELD  10 -Continue to follow Dr. Wynonia Musty in [facility] for any pulmonary  decompensation -Continue to avoid alcohol (last drink 03/2021) Continue followup with dr. Manuella Ghazi.    DVT prophylaxis:     Code Status:  Full code    Family CommunicationDeandrae, Wajda (Sister)  [phone]    Disposition Plan:  Home    Consults called:  General surgery.  GI / IR per AM MD.   Admission status: Inpatient.    Unit/ Expected LOS: Med surg. / 2-3 day   Para Skeans MD [facility]  6 PM- 2 AM. Please contact me via secure Chat 6 PM-2 AM. [phone] ( Pager ) To contact the [facility] Attending or Consulting provider [place] or covering provider during after hours [place], for this patient.   Check the care team in [facility] and look for a) attending/consulting [facility] provider listed and b) the [facility] team listed Log into [email or web address] and use [place]'s universal password to access. If you do not have the password, please contact the hospital operator. Locate the [facility] provider you are looking for under [facility] and page to a number that you can be directly reached. If you still have difficulty reaching the provider, please page the [facility] (Director on Call) for the Hospitalists listed on amion for assistance. [email or web address] 05/03/2022, 2:25 AM

## 2022-05-02 NOTE — Assessment & Plan Note (Signed)
outpt f/up with Dr. Wynonia Musty in [place] [facility]  -Continue to avoid alcohol (last drink 03/2021) Continue followup with Dr. Manuella Ghazi at [facility].

## 2022-05-02 NOTE — Sepsis Progress Note (Signed)
Elink following code sepsis °

## 2022-05-02 NOTE — Hospital Course (Signed)
A1ATD Cirrhosis: -MELD score 02/27/22 - 10 -Continue to follow Dr. Wynonia Musty in [facility] clinic for any pulmonary decompensation -Continue to avoid alcohol (last drink 03/2021)  Ascites:  -Continue Lasix 20 mg daily  -Continue Eplerenone '50mg'$  daily (breast tenderness with spironolactone) -LVP PRN (07/11/2021 -3 L)  Esophageal Varices: Without bleeding. Undergoing banding surveillance. Last banding 07/08/21, no varices seen 02/23/22 -Repeat EGD in 02/2023 with primary GI MD  Hepatic Encephalopathy: he has had no history of PSE, so will continue to hold his lactulose and Xifaxan  HCC Screening: LR-3 lesion found in 05/2021, biopsy in 07/2021 of this lesion was negative -Will obtain MRI done in August for personal review  Arthritis: Discussed that with his psoriasis on his lower extremities, he should be seen by his PCP and considered for psoriatic arthritis. Although this could easily represent routine osteoarthritis as well.

## 2022-05-02 NOTE — Consult Note (Signed)
This gentleman is well-known to the general surgical service, now being 16 days status post robotic cholecystectomy for acute on chronic cholecystitis. Procedure was complicated by the gentleman's cirrhosis secondary to an alpha 1 antitrypsin deficiency. He was discharged with a drain, and efforts to control his ascites, considering the high output of serous fluid from the drain. He presents this evening with recent episode of fever and chills, and increase in the right upper quadrant pain unrelated to his drain and associated anorexia.  CT scan impression as follows: IMPRESSION: 1. 6.0 x 3.2 x 5.2 cm relatively well-defined fluid collection in the gallbladder fossa. Fluid collection shows subtle peripheral/rim enhancement. Amorphous areas of increased attenuation within this fluid likely represent packing material although hemorrhagic debris or dropped stones cannot be entirely excluded. Evolving abscess is a concern. Fluid collection tracks posteriorly and inferiorly along the capsule of the liver into Morison's pouch. 2. Cirrhotic liver with evidence of portal venous hypertension including splenomegaly, recanalization of the paraumbilical vein and paraesophageal varices. 3. Trace free fluid in the pelvis. 4. Mild wall thickening in the hepatic flexure of the colon is likely secondary.  LFTs are remarkably/drastically changed.  White blood cell count tonight is 13, since surgery his white blood cell count has ranged from 11.5-23.3.  On examination he is alert and oriented and quite conversant.  He is elaborated significantly on his recent experience, and his knowledge of his disease process. JP drain bulb and tubing in his right upper quadrant are clear serous fluid with minimal fibrinous exudative strings in it.  I do not find a suspicious for abscess. Mildly tender in right upper quadrant. Patient is quite concerned about ensuring adequate pain control for his right upper quadrant  pain.  Impression/plan.  Collection in gallbladder fossa, potentially consistent with abscess though evolving.  JP/Blake drain postsurgical is right near this area. I am a little reluctant at this point in time to consider percutaneous drainage, and would like to follow him with serial exams and labs. I appreciate hospitalist admission, concur with antibiotic coverage, and following up cultures. I believe he may have clear liquid diet for now as tolerated.  I do not see urgent IR procedure for drain placement prior to Monday.  General surgery will continue to follow with you, thank you.  Ronny Bacon, M.D., [facility]  05/02/2022 ; 9:58 PM

## 2022-05-02 NOTE — ED Notes (Signed)
First Nurse Note: Pt to ED via POV for possible post op infection. Pt states that he started running fever of 101.4 today and having increased pain. Pt states that he still has a stent in from the surgery, drainage had started to go down but has not increased again. Pt states that he also a liver patient.

## 2022-05-02 NOTE — Assessment & Plan Note (Signed)
Surgical team recommends continuing his chronic pain medications and treat potential evolving abscess with antibiotics.  General surgery recommends HIDA scan which was performed and is negative

## 2022-05-02 NOTE — Assessment & Plan Note (Signed)
It is 6 x 3.2 x 5.2 cm In the gall bladder fossa. Seen on CT scan. General surgery Dr Christian Mate doesn't feel this needs drainage at this time and recommends PO Abx.  It may be an evolving abscess

## 2022-05-02 NOTE — ED Notes (Signed)
Back to [facility] stretcher via w/c, up to b/r for sample, gf at [facility].

## 2022-05-02 NOTE — ED Notes (Signed)
EDP at [facility] 

## 2022-05-02 NOTE — Consult Note (Signed)
PHARMACY -  BRIEF ANTIBIOTIC NOTE   Pharmacy has received consult(s) for zosyn from an ED provider.  The patient's profile has been reviewed for ht/wt/allergies/indication/available labs.    One time order(s) placed for zosyn 3.375 g   Further antibiotics/pharmacy consults should be ordered by admitting physician if indicated.                       Thank you, Darnelle Bos 05/02/2022  6:08 PM

## 2022-05-02 NOTE — Assessment & Plan Note (Signed)
Pt being followed at [facility]. Has appt with Dr Manuella Ghazi tomorrow @ 10:30 am

## 2022-05-02 NOTE — ED Triage Notes (Signed)
Pt presents to ER from home with complaints of RLQ pain. Pt reports he had emergency gallbladder surgery 2 weeks ago. Reports He started to develop fever and pain yesterday. Pt is following with transplant clinic for Liver following a protocol to measure his ascites reports on daily his output is about 1 to 2 L, reports monitors hourly and noticed a decreased in output yesterday afternoon. Pt has intermittent sharp pain, reports he is instructed not to take anything with proper supervision since he is following with  the transplant clinic [facility].

## 2022-05-02 NOTE — Assessment & Plan Note (Signed)
Pt meets sepsis criteria. Blood pressure 119/86, pulse (!) 109, temperature 99.7 F (37.6 C), temperature source Oral, resp. rate 16, height '5\' 10"'$  (1.778 m), weight 83.9 kg, SpO2 96 %.   We will cont iv abx  And follow c/s.

## 2022-05-02 NOTE — ED Provider Notes (Signed)
[facility] Provider Note    Event Date/Time   First MD Initiated Contact with Patient 05/02/22 1607     (approximate)   History   Abdominal Pain and Post-op Problem   HPI  Brian Vasquez is a 46 y.o. male began experiencing notable and fairly severe waves of pain with a slight persistence of pain in his right upper abdomen since this morning.  Nausea but no vomiting.  No bowel movement today but still passing gas.  No obvious fever but having at times where he feels hot or warm.  No chest pain no trouble breathing.  Pain is in the area of his recent surgery and around the area also where he has his fluid drain after surgery.  His drain is continued to drain fluid, but he is also seeing at times little strings of white in the drainage fluid   Discharge summary from October 16 "46 y.o.m  w/ remote history of alcohol use, liver cirrhosis primarily d/t alpha-1 antitrypsin deficiency, portal hypertension, esophageal varices; hypertension, hyponatremia on 10/11 underwent elective colonoscopy.  He had 2 polyps removed and was discharged home on stable condition.  Post discharge, patient developed abdominal pain in the right upper quadrant with radiation across intra-abdominal wall.  Pain persisted overnight and hence patient presented to the ED next morning on 04/16/22> I n ED CT scan of abdomen and pelvis showed gallbladder wall thickening with stones and hyperdense intraluminal contents, findings concerning for hemorrhagic cholecystitis.  There is also concern of intraluminal active bleeding.  Patient was admitted, seen by surgery underwent laparoscopic cholecystectomy"  Physical Exam   Triage Vital Signs: ED Triage Vitals  Enc Vitals Group     BP 05/02/22 1554 119/86     Pulse Rate 05/02/22 1554 (!) 109     Resp 05/02/22 1554 16     Temp 05/02/22 1554 99.7 F (37.6 C)     Temp Source 05/02/22 1554 Oral     SpO2 05/02/22 1554 96 %     Weight 05/02/22 1555 185 lb  (83.9 kg)     Height 05/02/22 1555 '5\' 10"'$  (1.778 m)     Head Circumference --      Peak Flow --      Pain Score 05/02/22 1554 10     Pain Loc --      Pain Edu? --      Excl. in [place]? --     Most recent vital signs: Vitals:   05/02/22 1554  BP: 119/86  Pulse: (!) 109  Resp: 16  Temp: 99.7 F (37.6 C)  SpO2: 96%     General: Awake, no distress.  He does appear in pain. CV:  Good peripheral perfusion.  Normal tones Resp:  Normal effort.  Clear lungs Abd:  No distention.  Soft exquisitely tender in the right upper abdomen and right flank.  There is positive tenderness to rebound and guarding in the right upper quadrant.  Palpation of the left sided of the abdomen does not produce pain on that side but he reports it refers pain to his right mid to right upper quadrant.  JP drain with yellow fluid, and what appears to be slight strings of perhaps pus or something within the fluid. Other:     ED Results / Procedures / Treatments   Labs (all labs ordered are listed, but only abnormal results are displayed) Labs Reviewed  LIPASE, BLOOD - Abnormal; Notable for the following components:  Result Value   Lipase 70 (*)    All other components within normal limits  COMPREHENSIVE METABOLIC PANEL - Abnormal; Notable for the following components:   Sodium 132 (*)    Glucose, Bld 123 (*)    Creatinine, Ser 0.59 (*)    Calcium 8.6 (*)    AST 88 (*)    ALT 75 (*)    Alkaline Phosphatase 270 (*)    Total Bilirubin 1.8 (*)    All other components within normal limits  CBC - Abnormal; Notable for the following components:   WBC 13.2 (*)    All other components within normal limits  URINALYSIS, ROUTINE W REFLEX MICROSCOPIC - Abnormal; Notable for the following components:   Color, Urine AMBER (*)    APPearance HAZY (*)    All other components within normal limits  BODY FLUID CULTURE W GRAM STAIN  CULTURE, BLOOD (ROUTINE X 2)  CULTURE, BLOOD (ROUTINE X 2)  LACTIC ACID, PLASMA   HEMOGLOBIN A1C     RADIOLOGY  CT ABDOMEN PELVIS W CONTRAST  Result Date: 05/02/2022 CLINICAL DATA:  Right-sided abdominal pain. EXAM: CT ABDOMEN AND PELVIS WITH CONTRAST TECHNIQUE: Multidetector CT imaging of the abdomen and pelvis was performed using the standard protocol following bolus administration of intravenous contrast. RADIATION DOSE REDUCTION: This exam was performed according to the departmental dose-optimization program which includes automated exposure control, adjustment of the mA and/or kV according to patient size and/or use of iterative reconstruction technique. CONTRAST:  130m OMNIPAQUE IOHEXOL 300 MG/ML  SOLN COMPARISON:  04/16/2022 FINDINGS: Lower chest: Dependent atelectasis noted in the lung bases. Hepatobiliary: Nodular liver contour compatible with the reported clinical history of cirrhosis. 6.0 x 3.2 x 5.2 cm relatively well-defined fluid collection is identified in the gallbladder fossa. Fluid collection shows subtle peripheral/rim enhancement. Amorphous areas of increased attenuation within this fluid likely represent packing material although hemorrhagic debris or drop stones cannot be entirely excluded. Fluid collection tracks posteriorly and inferiorly along the capsule of the liver in Morison's pouch. No intrahepatic or extrahepatic biliary dilation. Pancreas: No focal mass lesion. No dilatation of the main duct. No intraparenchymal cyst. No peripancreatic edema. Spleen: Spleen is enlarged. Adrenals/Urinary Tract: No adrenal nodule or mass. Kidneys unremarkable. No evidence for hydroureter. The urinary bladder appears normal for the degree of distention. Stomach/Bowel: Stomach is unremarkable. No gastric wall thickening. No evidence of outlet obstruction. Duodenum is normally positioned as is the ligament of Treitz. Jejunal loops in fossa. The left upper quadrant for mildly distended and filled with fluid. Pattern is nonobstructive. Ileal loops are decompressed. The  terminal ileum is normal. The appendix is normal. Mild wall thickening in the hepatic flexure of the colon is likely secondary. Left colon unremarkable. Vascular/Lymphatic: No abdominal aortic aneurysm. Portal vein is patent. Recanalization of the paraumbilical vein is consistent with portal venous hypertension. Paraesophageal varices noted. No abdominal aortic atherosclerotic calcification. Scattered small retroperitoneal lymph nodes evident. No pelvic sidewall lymphadenopathy. Reproductive: The prostate gland and seminal vesicles are unremarkable. Other: Trace free fluid is seen in the pelvis. Edema/inflammation is seen in the right upper quadrant. Surgical drain is seen along the inferior aspect of the right liver tracking up towards the gallbladder Musculoskeletal: No worrisome lytic or sclerotic osseous abnormality. IMPRESSION: 1. 6.0 x 3.2 x 5.2 cm relatively well-defined fluid collection in the gallbladder fossa. Fluid collection shows subtle peripheral/rim enhancement. Amorphous areas of increased attenuation within this fluid likely represent packing material although hemorrhagic debris or dropped stones cannot be entirely  excluded. Evolving abscess is a concern. Fluid collection tracks posteriorly and inferiorly along the capsule of the liver into Morison's pouch. 2. Cirrhotic liver with evidence of portal venous hypertension including splenomegaly, recanalization of the paraumbilical vein and paraesophageal varices. 3. Trace free fluid in the pelvis. 4. Mild wall thickening in the hepatic flexure of the colon is likely secondary. Electronically Signed   By: Misty Stanley M.D.   On: 05/02/2022 17:52        PROCEDURES:  Critical Care performed: no  CRITICAL CARE  Evidence of sepsis, broad-spectrum antibiotic therapy, and treatment for sepsis.  Code sepsis initiated.  Broad-spectrum antibiotic Zosyn.  Consultation with general surgery Dr. Christian Mate, consulted and admission to the hospital service  with Dr. Girard Cooter.  Patient elevated risk of mortality and morbidity due to evidence of sepsis.     Procedures   MEDICATIONS ORDERED IN ED: Medications  morphine (PF) 2 MG/ML injection 2 mg (has no administration in time range)  piperacillin-tazobactam (ZOSYN) IVPB 3.375 g (has no administration in time range)  morphine (PF) 4 MG/ML injection 4 mg (4 mg Intravenous Given 05/02/22 1655)  ondansetron (ZOFRAN) injection 4 mg (4 mg Intravenous Given 05/02/22 1655)  morphine (PF) 2 MG/ML injection 2 mg (2 mg Intravenous Given 05/02/22 1713)  iohexol (OMNIPAQUE) 300 MG/ML solution 100 mL (100 mLs Intravenous Contrast Given 05/02/22 1727)     IMPRESSION / MDM / ASSESSMENT AND PLAN / ED COURSE  I reviewed the triage vital signs and the nursing notes.                             Differential diagnosis includes but is not limited to, abdominal perforation, aortic dissection, cholecystitis, appendicitis, diverticulitis, colitis, esophagitis/gastritis, kidney stone, pyelonephritis, urinary tract infection, aortic aneurysm. All are considered in decision and treatment plan. Based upon the patient's presentation and risk factors, proceed with CT imaging, pain control antiemetic.  High concern for possible surgical site or surrounding infection, also would entertain possibility of peritonitis as the patient has a history of ascites and now has a JP drain in with notable peritonitis pain and tenderness surrounding that site as well.  We will proceed with imaging,  ----------------------------------------- 5:33 PM on 05/02/2022 ----------------------------------------- Patient reports pain is improved after morphine.  Currently resting comfortably awaiting imaging   Patient's presentation is most consistent with acute complicated illness / injury requiring diagnostic workup.    Patient understanding agreeable with plan for admission.  Meets sepsis criteria.  No evidence of severe sepsis or septic  shock at this time.  Chronically elevated bilirubin.   Patient admitted to the hospitalist service.  General surgery consulting.  FINAL CLINICAL IMPRESSION(S) / ED DIAGNOSES   Final diagnoses:  Sepsis, due to unspecified organism, unspecified whether acute organ dysfunction present ([place])  Intra-abdominal abscess ([place])     Rx / DC Orders   ED Discharge Orders     None        Note:  This document was prepared using Dragon voice recognition software and may include unintentional dictation errors.   Delman Kitten, MD 05/02/22 1945

## 2022-05-03 ENCOUNTER — Inpatient Hospital Stay: Payer: 59

## 2022-05-03 DIAGNOSIS — L0291 Cutaneous abscess, unspecified: Secondary | ICD-10-CM

## 2022-05-03 DIAGNOSIS — K7460 Unspecified cirrhosis of liver: Secondary | ICD-10-CM | POA: Diagnosis not present

## 2022-05-03 DIAGNOSIS — R188 Other ascites: Secondary | ICD-10-CM

## 2022-05-03 DIAGNOSIS — R1084 Generalized abdominal pain: Secondary | ICD-10-CM | POA: Diagnosis not present

## 2022-05-03 DIAGNOSIS — E8801 Alpha-1-antitrypsin deficiency: Secondary | ICD-10-CM

## 2022-05-03 LAB — COMPREHENSIVE METABOLIC PANEL
ALT: 59 U/L — ABNORMAL HIGH (ref 0–44)
AST: 61 U/L — ABNORMAL HIGH (ref 15–41)
Albumin: 3 g/dL — ABNORMAL LOW (ref 3.5–5.0)
Alkaline Phosphatase: 220 U/L — ABNORMAL HIGH (ref 38–126)
Anion gap: 6 (ref 5–15)
BUN: 12 mg/dL (ref 6–20)
CO2: 24 mmol/L (ref 22–32)
Calcium: 8.1 mg/dL — ABNORMAL LOW (ref 8.9–10.3)
Chloride: 100 mmol/L (ref 98–111)
Creatinine, Ser: 0.79 mg/dL (ref 0.61–1.24)
GFR, Estimated: >60 mL/min (ref >60)
Glucose, Bld: 108 mg/dL — ABNORMAL HIGH (ref 70–99)
Potassium: 4.1 mmol/L (ref 3.5–5.1)
Sodium: 130 mmol/L — ABNORMAL LOW (ref 135–145)
Total Bilirubin: 2.2 mg/dL — ABNORMAL HIGH (ref 0.3–1.2)
Total Protein: 5.9 g/dL — ABNORMAL LOW (ref 6.5–8.1)

## 2022-05-03 LAB — PROTIME-INR
INR: 1.7 — ABNORMAL HIGH (ref 0.8–1.2)
Prothrombin Time: 19.9 seconds — ABNORMAL HIGH (ref 11.4–15.2)

## 2022-05-03 LAB — PROCALCITONIN: Procalcitonin: 0.14 ng/mL

## 2022-05-03 LAB — CBC
HCT: 42.8 % (ref 39.0–52.0)
Hemoglobin: 14.5 g/dL (ref 13.0–17.0)
MCH: 31 pg (ref 26.0–34.0)
MCHC: 33.9 g/dL (ref 30.0–36.0)
MCV: 91.6 fL (ref 80.0–100.0)
Platelets: 146 10*3/uL — ABNORMAL LOW (ref 150–400)
RBC: 4.67 MIL/uL (ref 4.22–5.81)
RDW: 14.1 % (ref 11.5–15.5)
WBC: 17.5 10*3/uL — ABNORMAL HIGH (ref 4.0–10.5)
nRBC: 0 % (ref 0.0–0.2)

## 2022-05-03 LAB — HIV ANTIBODY (ROUTINE TESTING W REFLEX): HIV Screen 4th Generation wRfx: NONREACTIVE

## 2022-05-03 LAB — HEMOGLOBIN A1C
Hgb A1c MFr Bld: 4.6 % — ABNORMAL LOW (ref 4.8–5.6)
Mean Plasma Glucose: 85.32 mg/dL

## 2022-05-03 MED ORDER — TECHNETIUM TC 99M MEBROFENIN IV KIT
7.4800 | PACK | Freq: Once | INTRAVENOUS | Status: AC | PRN
  Administered 2022-05-03: 7.48 via INTRAVENOUS

## 2022-05-03 MED ORDER — OXYCODONE HCL 5 MG PO TABS
5.0000 mg | ORAL_TABLET | Freq: Four times a day (QID) | ORAL | Status: DC | PRN
  Administered 2022-05-03 – 2022-05-04 (×3): 5 mg via ORAL
  Filled 2022-05-03 (×3): qty 1

## 2022-05-03 NOTE — Progress Notes (Signed)
[place] Hospital Day(s): 1.   Post op day(s):  Marland Kitchen   Interval History: Patient seen and examined, no acute events or new complaints overnight. Patient reports adequate pain control.  Drain output minimal, as reported by pt.   Review of Systems:  Constitutional: denies fever, chills  Respiratory: denies any shortness of breath  Cardiovascular: denies chest pain or palpitations  Gastrointestinal: denies N/V, or diarrhea/and bowel function as per interval history Musculoskeletal: denies pain, decreased motor or sensation Integumentary: denies any other rashes or skin discolorations  Vital signs in last 24 hours: [min-max] current  Temp:  [98.4 F (36.9 C)-99.8 F (37.7 C)] 98.4 F (36.9 C) (10/29 1931) Pulse Rate:  [85-110] 107 (10/29 1931) Resp:  [16-20] 17 (10/29 1931) BP: (112-136)/(76-90) 122/77 (10/29 1931) SpO2:  [93 %-99 %] 96 % (10/29 1931) Weight:  [85 kg] 85 kg (10/29 0125)     Height: '5\' 10"'$  (177.8 cm) Weight: 85 kg BMI (Calculated): 26.89   Intake/Output last 2 shifts:  10/28 0701 - 10/29 0700 In: 106.4 [I.V.:6.4; IV Piggyback:100] Out: 400 [Urine:400]   Physical Exam:  Constitutional: alert, cooperative and no distress  Respiratory: breathing non-labored at rest  Cardiovascular: regular rate and sinus rhythm  Gastrointestinal: minimal RUQ tenderness. soft, non-tender, and non-distended Integumentary: intact.   Labs:     Latest Ref Rng & Units 05/03/2022    4:27 AM 05/02/2022    4:03 PM 04/30/2022    1:21 PM  CBC  WBC 4.0 - 10.5 K/uL 17.5  13.2  11.5   Hemoglobin 13.0 - 17.0 g/dL 14.5  15.3  15.6   Hematocrit 39.0 - 52.0 % 42.8  44.1  45.9   Platelets 150 - 400 K/uL 146  174  201       Latest Ref Rng & Units 05/03/2022    4:27 AM 05/02/2022    4:03 PM 04/30/2022    1:21 PM  CMP  Glucose 70 - 99 mg/dL 108  123  121   BUN 6 - 20 mg/dL '12  16  15   '$ Creatinine 0.61 - 1.24 mg/dL 0.79  0.59  0.78   Sodium 135 -  145 mmol/L 130  132  131   Potassium 3.5 - 5.1 mmol/L 4.1  4.6  4.5   Chloride 98 - 111 mmol/L 100  104  96   CO2 22 - 32 mmol/L '24  22  24   '$ Calcium 8.9 - 10.3 mg/dL 8.1  8.6  8.6   Total Protein 6.5 - 8.1 g/dL 5.9  7.1  6.6   Total Bilirubin 0.3 - 1.2 mg/dL 2.2  1.8  1.9   Alkaline Phos 38 - 126 U/L 220  270  379   AST 15 - 41 U/L 61  88  116   ALT 0 - 44 U/L 59  75  104    Procalcitonin is low this morning, venous lactate was normal yesterday.  Imaging studies: HIDA scan negative for leak.    Assessment/Plan:  46 y.o. male with cirrhosis   s/p robotic chole  for acute cholecystitis.  Now 15 days post-op without in hosp fevers, with WBC up to 17, and abnormal LFTs.   Uncertain process in GB fossa, possibly evolving abscess.  Doubt currently drainable.  His Hx is complicated by pertinent comorbidities including.  Patient Active Problem List   Diagnosis Date Noted   Abscess 05/02/2022   Abdominal pain 05/02/2022   Acute cholecystitis due to  biliary calculus 04/16/2022   Acute hemorrhagic cholecystitis 04/16/2022   Colon cancer screening    Adenomatous polyp of colon    Alpha-1-antitrypsin deficiency ([place]) 03/26/2022   End-stage liver disease ([place]) 03/26/2022   Secondary esophageal varices without bleeding ([place]) 03/26/2022   Alpha 1-antitrypsin PiMS phenotype 93/73/4287   Alcoholic cirrhosis of liver with ascites ([place]) 04/17/2021   Liver mass 04/17/2021   Portal venous hypertension ([place]) 04/17/2021   Liver cirrhosis ([place]) 04/12/2021    - He seems highly motivated to make his appointments this week, and I believe he may be discharged with PO Abx, and adequate pain control.     - ADAT.       All of the above findings and recommendations were discussed with the patient, and all of patient's questions were answered to their expressed satisfaction.  -- Ronny Bacon, M.D., [facility] 05/03/2022

## 2022-05-03 NOTE — Progress Notes (Signed)
  Progress Note   Patient: Brian Vasquez FUX:[id number] DOB: [birth year] DOA: 05/02/2022     1 DOS: the patient was seen and examined on 05/03/2022   Brief hospital course: 46 year old male admitted for fever with Tmax of 101.4, right upper quadrant abdominal pain  10/29: Evaluated by surgery who does not feel there is any drainable abscess. They recommend Abx and pain mgmt for now.   Assessment and Plan: * Abdominal pain Surgical team recommends continuing his chronic pain medications and treat potential evolving abscess with antibiotics.  General surgery recommends HIDA scan which was performed and is negative   Sepsis (HCC)-resolved as of 05/03/2022 Pt meets sepsis criteria. Blood pressure 119/86, pulse (!) 109, temperature 99.7 F (37.6 C), temperature source Oral, resp. rate 16, height '5\' 10"'$  (1.778 m), weight 83.9 kg, SpO2 96 %.   We will cont iv abx  And follow c/s.    Abscess  It is 6 x 3.2 x 5.2 cm In the gall bladder fossa. Seen on CT scan. General surgery Dr Christian Mate doesn't feel this needs drainage at this time and recommends PO Abx.  It may be an evolving abscess  Alpha-1-antitrypsin deficiency ([place]) Pt being followed at [facility]. Has appt with Dr Manuella Ghazi tomorrow @ 10:30 am   Liver cirrhosis ([place]) outpt f/up with Dr. Wynonia Musty in [place] clinic  -Continue to avoid alcohol (last drink 03/2021) Continue followup with Dr. Manuella Ghazi at [facility].       Subjective: No more fever while in the hospital.  Pain is well controlled  Physical Exam: Vitals:   05/03/22 0125 05/03/22 0350 05/03/22 0751 05/03/22 1711  BP: (!) 136/90 123/86 125/86 131/82  Pulse: 85 89 95 (!) 110  Resp: '20 20  18  '$ Temp: 98.5 F (36.9 C) 99.2 F (37.3 C) 98.8 F (37.1 C) 98.6 F (37 C)  TempSrc: Oral Oral Oral Oral  SpO2: 99% 93% 93% 93%  Weight: 85 kg     Height: '5\' 10"'$  (1.33 m)      46 year old male lying in the bed comfortably without any acute distress Lungs clear to auscultation  bilaterally Heart regular rate and rhythm Abdomen JP drain bulb and tubing in the right upper quadrant with clear serous fluid mild tenderness in the right upper quadrant Neuro alert and oriented, nonfocal Data Reviewed:  CT showed 6 x 3.2 x 5.2 cm fluid collection in the gallbladder fossa concerning for possible evolving abscess.  HIDA scan negative  Family Communication: None  Disposition: Status is: Inpatient Remains inpatient appropriate because: Management of potential evolving abscess   Planned Discharge Destination: Home    DVT prophylaxis-SCDs Time spent: 35 minutes  Author: Max Sane, MD 05/03/2022 5:20 PM  For on call review [email or web address].

## 2022-05-04 DIAGNOSIS — R188 Other ascites: Secondary | ICD-10-CM | POA: Diagnosis not present

## 2022-05-04 DIAGNOSIS — E8801 Alpha-1-antitrypsin deficiency: Secondary | ICD-10-CM | POA: Diagnosis not present

## 2022-05-04 DIAGNOSIS — K7460 Unspecified cirrhosis of liver: Secondary | ICD-10-CM | POA: Diagnosis not present

## 2022-05-04 DIAGNOSIS — K651 Peritoneal abscess: Secondary | ICD-10-CM

## 2022-05-04 LAB — COMPREHENSIVE METABOLIC PANEL
ALT: 57 U/L — ABNORMAL HIGH (ref 0–44)
AST: 61 U/L — ABNORMAL HIGH (ref 15–41)
Albumin: 2.7 g/dL — ABNORMAL LOW (ref 3.5–5.0)
Alkaline Phosphatase: 209 U/L — ABNORMAL HIGH (ref 38–126)
Anion gap: 4 — ABNORMAL LOW (ref 5–15)
BUN: 12 mg/dL (ref 6–20)
CO2: 25 mmol/L (ref 22–32)
Calcium: 7.7 mg/dL — ABNORMAL LOW (ref 8.9–10.3)
Chloride: 100 mmol/L (ref 98–111)
Creatinine, Ser: 0.62 mg/dL (ref 0.61–1.24)
GFR, Estimated: >60 mL/min (ref >60)
Glucose, Bld: 120 mg/dL — ABNORMAL HIGH (ref 70–99)
Potassium: 4.4 mmol/L (ref 3.5–5.1)
Sodium: 129 mmol/L — ABNORMAL LOW (ref 135–145)
Total Bilirubin: 2.4 mg/dL — ABNORMAL HIGH (ref 0.3–1.2)
Total Protein: 5.5 g/dL — ABNORMAL LOW (ref 6.5–8.1)

## 2022-05-04 LAB — CBC
HCT: 39 % (ref 39.0–52.0)
Hemoglobin: 13.4 g/dL (ref 13.0–17.0)
MCH: 30.9 pg (ref 26.0–34.0)
MCHC: 34.4 g/dL (ref 30.0–36.0)
MCV: 90.1 fL (ref 80.0–100.0)
Platelets: 141 10*3/uL — ABNORMAL LOW (ref 150–400)
RBC: 4.33 MIL/uL (ref 4.22–5.81)
RDW: 13.9 % (ref 11.5–15.5)
WBC: 17 10*3/uL — ABNORMAL HIGH (ref 4.0–10.5)
nRBC: 0 % (ref 0.0–0.2)

## 2022-05-04 LAB — PROTIME-INR
INR: 1.9 — ABNORMAL HIGH (ref 0.8–1.2)
Prothrombin Time: 21.4 seconds — ABNORMAL HIGH (ref 11.4–15.2)

## 2022-05-04 LAB — PHOSPHORUS: Phosphorus: 2.8 mg/dL (ref 2.5–4.6)

## 2022-05-04 LAB — MAGNESIUM: Magnesium: 2.1 mg/dL (ref 1.7–2.4)

## 2022-05-04 MED ORDER — AMOXICILLIN-POT CLAVULANATE 500-125 MG PO TABS: 1.0000 | ORAL_TABLET | Freq: Two times a day (BID) | ORAL | 0 refills | Status: AC

## 2022-05-04 MED ORDER — VITAMIN K1 10 MG/ML IJ SOLN
5.0000 mg | Freq: Once | INTRAVENOUS | Status: DC
  Filled 2022-05-04: qty 0.5

## 2022-05-04 NOTE — Telephone Encounter (Signed)
Noted he has been discharged .

## 2022-05-04 NOTE — TOC Initial Note (Signed)
Transition of Care [facility]) - Initial/Assessment Note    Patient Details  Name: Brian Vasquez MRN: [id number] Date of Birth: [birth year]  Transition of Care [facility]) CM/SW Contact:    Beverly Sessions, RN Phone Number: 05/04/2022, 9:27 AM  Clinical Narrative:                  Transition of Care (TOC) Screening Note   Patient Details  Name: Brian Vasquez Date of Birth: Nov 24, 1975   Transition of Care [facility]) CM/SW Contact:    Beverly Sessions, RN Phone Number: 05/04/2022, 9:27 AM    Transition of Care Department [facility]) has reviewed patient and no TOC needs have been identified at this time. We will continue to monitor patient advancement through interdisciplinary progression rounds. If new patient transition needs arise, please place a TOC consult.          Patient Goals and CMS Choice        Expected Discharge Plan and Services           Expected Discharge Date: 05/04/22                                    Prior Living Arrangements/Services                       Activities of Daily Living Home Assistive Devices/Equipment: None ADL Screening (condition at time of admission) Patient's cognitive ability adequate to safely complete daily activities?: Yes Is the patient deaf or have difficulty hearing?: No Does the patient have difficulty seeing, even when wearing glasses/contacts?: No Does the patient have difficulty concentrating, remembering, or making decisions?: No Patient able to express need for assistance with ADLs?: Yes Does the patient have difficulty dressing or bathing?: No Independently performs ADLs?: Yes (appropriate for developmental age) Does the patient have difficulty walking or climbing stairs?: No Weakness of Legs: None Weakness of Arms/Hands: None  Permission Sought/Granted                  Emotional Assessment              Admission diagnosis:  Intra-abdominal abscess ([place]) [K65.1] Abdominal pain  [R10.9] Sepsis, due to unspecified organism, unspecified whether acute organ dysfunction present 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Phone: [phone]: [phone]    Social Determinants of Health (SDOH) Interventions    Readmission Risk Interventions     No data to display

## 2022-05-04 NOTE — Progress Notes (Signed)
Patient seen by Dr. Manuella Ghazi and IR.  Discharge order in place.  Discharge instructions given to patient.  Mother at bedside.  Patient verbalized understanding to instructions.  RLQ drain and dressing CDI.  Discharged home.

## 2022-05-04 NOTE — Progress Notes (Signed)
[facility] Day(s): 2.   Interval History:  Patient seen and examined No acute events or new complaints overnight.  Patient reports he is doing much better; wants to go home to make his transplant appointment No fever, chills, nausea, emesis He continues to have stable, but elevated leukocytosis at 17.0K Renal function normal; sCr - 0.62; UO - unmeasured LFT and bilirubin level consistent with known liver failure HIDA without bile leak evidence Surgical drain; 25 ccs; serous He is on soft diet; tolerating   Vital signs in last 24 hours: [min-max] current  Temp:  [98 F (36.7 C)-98.8 F (37.1 C)] 98 F (36.7 C) (10/30 0528) Pulse Rate:  [87-110] 87 (10/30 0528) Resp:  [16-18] 16 (10/30 0528) BP: (122-137)/(77-86) 137/83 (10/30 0528) SpO2:  [93 %-98 %] 98 % (10/30 0528)     Height: '5\' 10"'$  (177.8 cm) Weight: 85 kg BMI (Calculated): 26.89   Intake/Output last 2 shifts:  10/29 0701 - 10/30 0700 In: 1755.5 [P.O.:480; I.V.:1131.8; IV Piggyback:143.7] Out: 2025 [Urine:2000; Drains:25]   Physical Exam:  Constitutional: alert, cooperative and no distress  Respiratory: breathing non-labored at rest  Cardiovascular: regular rate and sinus rhythm  Gastrointestinal: soft, non-tender, and non-distended, no rebound/guarding. JP in RUQ with serous fluid.  Integumentary: Laparoscopic incisions are well healed   Labs:     Latest Ref Rng & Units 05/04/2022    5:23 AM 05/03/2022    4:27 AM 05/02/2022    4:03 PM  CBC  WBC 4.0 - 10.5 K/uL 17.0  17.5  13.2   Hemoglobin 13.0 - 17.0 g/dL 13.4  14.5  15.3   Hematocrit 39.0 - 52.0 % 39.0  42.8  44.1   Platelets 150 - 400 K/uL 141  146  174       Latest Ref Rng & Units 05/04/2022    5:23 AM 05/03/2022    4:27 AM 05/02/2022    4:03 PM  CMP  Glucose 70 - 99 mg/dL 120  108  123   BUN 6 - 20 mg/dL '12  12  16   '$ Creatinine 0.61 - 1.24 mg/dL 0.62  0.79  0.59   Sodium 135 - 145 mmol/L 129  130   132   Potassium 3.5 - 5.1 mmol/L 4.4  4.1  4.6   Chloride 98 - 111 mmol/L 100  100  104   CO2 22 - 32 mmol/L '25  24  22   '$ Calcium 8.9 - 10.3 mg/dL 7.7  8.1  8.6   Total Protein 6.5 - 8.1 g/dL 5.5  5.9  7.1   Total Bilirubin 0.3 - 1.2 mg/dL 2.4  2.2  1.8   Alkaline Phos 38 - 126 U/L 209  220  270   AST 15 - 41 U/L 61  61  88   ALT 0 - 44 U/L 57  59  75     Imaging studies: No new pertinent imaging studies   Assessment/Plan:  46 y.o. male with gallbladder fossa fluid collection, unclear etiology, but no evidence of bile leak 18 days s/p robotic assisted laparoscopic cholecystectomy for hemorrhagic cholecystitis, complicated by history of liver failure on transplant list.    - Discussed and reviewed case and imaging with interventional radiology this morning who are agreeable with considering aspiration vs drain placement. However, I discuss this plan with patient and his family at bedside. He fortunately is doing well clinically without further fever nor abdominal pain. At this time, he would like  to forgo any procedures in order to make his transplant appointment this morning. I think this is reasonable given his significant clinical improvements. We will ensure he discharges with Antibiotics. He is due to follow up with me this week (11/02). He understands that should abdominal pain return or he again develops fevers, he should return to the ED. I will consider re-imaging as outpatient pending clinical condition.   All of the above findings and recommendations were discussed with the patient, patient's family, and the medical team, and all of patient's and family's questions were answered to their expressed satisfaction.  -- Edison Simon, PA-C [facility] 05/04/2022, 7:43 AM M-F: 7am - 4pm

## 2022-05-06 ENCOUNTER — Encounter: Payer: Self-pay | Admitting: Gastroenterology

## 2022-05-06 ENCOUNTER — Telehealth: Payer: 59 | Admitting: Gastroenterology

## 2022-05-06 NOTE — Discharge Summary (Signed)
Physician Discharge Summary   Patient: Brian Vasquez MRN: [id number] DOB: 05-08-1976  Admit date:     05/02/2022  Discharge date: 05/04/2022  Discharge Physician: Max Sane   PCP: Wayland Denis, PA-C   Recommendations at discharge:    F/up with outpt providers as requested  Discharge Diagnoses: Principal Problem:   Abdominal pain Active Problems:   Liver cirrhosis ([place])   Alpha-1-antitrypsin deficiency ([place])   Abscess   Intra-abdominal abscess [facility])  Resolved Problems:   Sepsis [facility])  Hospital Course: 46 year old male admitted for fever with Tmax of 101.4, right upper quadrant abdominal pain  10/29: Evaluated by surgery who does not feel there is any drainable abscess. They recommend Abx and pain mgmt for now.  Assessment and Plan: * Abdominal pain Gall bladder fossa fluid collection likely abscess  It is 6 x 3.2 x 5.2 cm In the gall bladder fossa. Seen on CT scan. General surgery Dr Christian Mate doesn't feel this needs drainage at this time and recommends PO Abx.  - Surgeon discussed and reviewed case and imaging with interventional radiology who are agreeable with considering aspiration vs drain placement. However, patient and his family prefers to forego any procedures in order to make his transplant appointment this morning.  - after d/w surgical team, it was felt that this is reasonable given his significant clinical improvements.  - being DC on PO Antibiotics.  He is due to follow up with This am @ 10:30 with his transplant physician @ [facility]/surgery on (11/02) and has appt with Dr Vicente Males on 10/1. - He understands that should abdominal pain return or he again develops fevers, he should return to the ED.   Alpha-1-antitrypsin deficiency ([place]) Pt being followed at [facility]. Has appt with Dr Manuella Ghazi on 10/30 @ 10:30 am   Liver cirrhosis ([place]) outpt f/up with Dr. Wynonia Musty in [place] clinic  -Continue to avoid alcohol (last drink 03/2021) Continue followup with Dr. Manuella Ghazi at  [facility].         Consultants: Surgery Disposition: Home Diet recommendation:  Discharge Diet Orders (From admission, onward)     Start     Ordered   05/04/22 0000  Diet - low sodium heart healthy        05/04/22 0729           Carb modified diet DISCHARGE MEDICATION: Allergies as of 05/04/2022   No Known Allergies      Medication List     STOP taking these medications    pregabalin 100 MG capsule Commonly known as: LYRICA       TAKE these medications    amoxicillin-clavulanate 500-125 MG tablet Commonly known as: Augmentin Take 1 tablet by mouth 2 (two) times daily for 10 days.   eplerenone 50 MG tablet Commonly known as: INSPRA Take 100 mg by mouth daily.   folic acid 1 MG tablet Commonly known as: FOLVITE Take 1 tablet by mouth daily.   furosemide 20 MG tablet Commonly known as: Lasix Take 1 tablet (20 mg total) by mouth daily. What changed: how much to take   oxyCODONE 5 MG immediate release tablet Commonly known as: Oxy IR/ROXICODONE Take 5 mg by mouth every 6 (six) hours as needed for severe pain.   thiamine 100 MG tablet Commonly known as: VITAMIN B1 Take 1 tablet (100 mg total) by mouth daily.        Follow-up Information     Wayland Denis, PA-C. Schedule an appointment as soon as possible for a visit in  1 week(s).   Specialty: Physician Assistant Why: [facility] Discharge F/UP Contact information: [place] [address] [phone]         [name], MD. Go on 05/06/2022.   Specialty: Gastroenterology Why: as scheduled Contact information: [place] [address] [phone]         Jules Husbands, MD. Schedule an appointment as soon as possible for a visit on 05/07/2022.   Specialty: General Surgery Why: as scheduled Contact information: [address] [place] [address] [phone]         Ian Bushman, MD. Go on  05/04/2022.   Specialty: Transplant Hepatology Why: as scheduled today @ 10:30 am Contact information: [place] [address] [place] [address] [phone]                Discharge Exam: Filed Weights   05/02/22 1555 05/03/22 0125  Weight: 83.9 kg 10 kg   46 year old male lying in the bed comfortably without any acute distress Lungs clear to auscultation bilaterally Heart regular rate and rhythm Abdomen JP drain bulb and tubing in the right upper quadrant with clear serous fluid mild tenderness in the right upper quadrant Neuro alert and oriented, nonfocal  Condition at discharge: fair  The results of significant diagnostics from this hospitalization (including imaging, microbiology, ancillary and laboratory) are listed below for reference.   Imaging Studies: NM HEPATOBILIARY LEAK (POST-SURGICAL)  Result Date: 05/03/2022 CLINICAL DATA:  Postoperative abdominal pain, cholecystectomy 04/16/2022. Cirrhosis. EXAM: NUCLEAR MEDICINE HEPATOBILIARY IMAGING TECHNIQUE: Sequential images of the abdomen were obtained out to 60 minutes following intravenous administration of radiopharmaceutical. RADIOPHARMACEUTICALS:  7.5 mCi Tc-69m Choletec IV COMPARISON:  CT scan 05/02/2022 FINDINGS: There is satisfactory uptake of radiopharmaceutical from the blood pool. Biliary and bowel activity observed at 5 minutes with progressive excretion into bowel. Over the 2 hour observation, no findings suggestive of bile leak are identified. IMPRESSION: 1. No findings of bowel leak during 2 hour hepatobiliary imaging. Electronically Signed   By: WVan ClinesM.D.   On: 05/03/2022 15:44   CT ABDOMEN PELVIS W CONTRAST  Result Date: 05/02/2022 CLINICAL DATA:  Right-sided abdominal pain. EXAM: CT ABDOMEN AND PELVIS WITH CONTRAST TECHNIQUE: Multidetector CT imaging of the abdomen and pelvis was performed using the standard protocol following bolus administration of intravenous contrast.  RADIATION DOSE REDUCTION: This exam was performed according to the departmental dose-optimization program which includes automated exposure control, adjustment of the mA and/or kV according to patient size and/or use of iterative reconstruction technique. CONTRAST:  1043mOMNIPAQUE IOHEXOL 300 MG/ML  SOLN COMPARISON:  04/16/2022 FINDINGS: Lower chest: Dependent atelectasis noted in the lung bases. Hepatobiliary: Nodular liver contour compatible with the reported clinical history of cirrhosis. 6.0 x 3.2 x 5.2 cm relatively well-defined fluid collection is identified in the gallbladder fossa. Fluid collection shows subtle peripheral/rim enhancement. Amorphous areas of increased attenuation within this fluid likely represent packing material although hemorrhagic debris or drop stones cannot be entirely excluded. Fluid collection tracks posteriorly and inferiorly along the capsule of the liver in Morison's pouch. No intrahepatic or extrahepatic biliary dilation. Pancreas: No focal mass lesion. No dilatation of the main duct. No intraparenchymal cyst. No peripancreatic edema. Spleen: Spleen is enlarged. Adrenals/Urinary Tract: No adrenal nodule or mass. Kidneys unremarkable. No evidence for hydroureter. The urinary bladder appears normal for the degree of distention. Stomach/Bowel: Stomach is unremarkable. No gastric wall thickening. No evidence of  outlet obstruction. Duodenum is normally positioned as is the ligament of Treitz. Jejunal loops in fossa. The left upper quadrant for mildly distended and filled with fluid. Pattern is nonobstructive. Ileal loops are decompressed. The terminal ileum is normal. The appendix is normal. Mild wall thickening in the hepatic flexure of the colon is likely secondary. Left colon unremarkable. Vascular/Lymphatic: No abdominal aortic aneurysm. Portal vein is patent. Recanalization of the paraumbilical vein is consistent with portal venous hypertension. Paraesophageal varices noted. No  abdominal aortic atherosclerotic calcification. Scattered small retroperitoneal lymph nodes evident. No pelvic sidewall lymphadenopathy. Reproductive: The prostate gland and seminal vesicles are unremarkable. Other: Trace free fluid is seen in the pelvis. Edema/inflammation is seen in the right upper quadrant. Surgical drain is seen along the inferior aspect of the right liver tracking up towards the gallbladder Musculoskeletal: No worrisome lytic or sclerotic osseous abnormality. IMPRESSION: 1. 6.0 x 3.2 x 5.2 cm relatively well-defined fluid collection in the gallbladder fossa. Fluid collection shows subtle peripheral/rim enhancement. Amorphous areas of increased attenuation within this fluid likely represent packing material although hemorrhagic debris or dropped stones cannot be entirely excluded. Evolving abscess is a concern. Fluid collection tracks posteriorly and inferiorly along the capsule of the liver into Morison's pouch. 2. Cirrhotic liver with evidence of portal venous hypertension including splenomegaly, recanalization of the paraumbilical vein and paraesophageal varices. 3. Trace free fluid in the pelvis. 4. Mild wall thickening in the hepatic flexure of the colon is likely secondary. Electronically Signed   By: Misty Stanley M.D.   On: 05/02/2022 17:52   US ABDOMEN LIMITED RUQ (LIVER/GB)  Result Date: 04/16/2022 CLINICAL DATA:  Right upper quadrant pain for 2 days. EXAM: ULTRASOUND ABDOMEN LIMITED RIGHT UPPER QUADRANT COMPARISON:  CT 04/16/2022 FINDINGS: Gallbladder: As noted on the CT from earlier today the gallbladder appears distended by sludge and multiple stones which measure up to 7 mm. Gallbladder wall thickening is noted measuring 3.5 mm. Common bile duct: Diameter: 4 mm.  No intrahepatic bile duct dilatation. Liver: The liver appears heterogeneous with a nodular contour compatible with cirrhosis. There is a small iso to hypoechoic structure within the right lobe of liver measuring 2.6  x 1.8 x 1.7 cm. Portal vein is patent on color Doppler imaging with normal direction of blood flow towards the liver. Other: None. IMPRESSION: 1. The gallbladder is distended by sludge and multiple stones. Gallbladder wall thickening is noted, which in the setting of underlying cirrhosis may be a nonspecific finding. If there is clinical concern for acute cholecystitis consider nuclear medicine HIDA scan. 2. Morphologic features of the liver compatible with cirrhosis. 3. Indeterminate hypoechoic structure within the right lobe of liver measuring 2.6 cm. This may represent a small hepatic adenoma or focal nodular hyperplasia. On the recent MRI of the abdomen several indeterminate lesions were identified for which follow-up imaging is advised in 6 months. Electronically Signed   By: Kerby Moors M.D.   On: 04/16/2022 15:04   CT ABDOMEN PELVIS W CONTRAST  Result Date: 04/16/2022 CLINICAL DATA:  Abdominal pain EXAM: CT ABDOMEN AND PELVIS WITH CONTRAST TECHNIQUE: Multidetector CT imaging of the abdomen and pelvis was performed using the standard protocol following bolus administration of intravenous contrast. RADIATION DOSE REDUCTION: This exam was performed according to the departmental dose-optimization program which includes automated exposure control, adjustment of the mA and/or kV according to patient size and/or use of iterative reconstruction technique. CONTRAST:  140m OMNIPAQUE IOHEXOL 300 MG/ML  SOLN COMPARISON:  Abdominal MRI dated February 20, 2022 FINDINGS: Lower chest: No acute abnormality. Hepatobiliary: Cirrhotic liver morphology. Small lesions of the right hepatic lobe described on prior MRI not well visualized, likely due to contrast timing. Recanalized paraumbilical vein. Gallbladder wall thickening with stones and hyperdense intraluminal contents. Focal area of hyperdensity measuring 8 mm on series 2, image 26 with similar attenuation to aortic blood pool, increases in size on delayed imaging. No  biliary ductal dilation. Pancreas: Unremarkable. No pancreatic ductal dilatation or surrounding inflammatory changes. Spleen: Splenomegaly, measuring up to 17.0 cm. Adrenals/Urinary Tract: Bilateral adrenal glands are unremarkable. No hydronephrosis or nephrolithiasis. Stomach/Bowel: Stomach is within normal limits. Appendix appears normal. No evidence of bowel wall thickening, distention, or inflammatory changes. Vascular/Lymphatic: Mild aortic atherosclerosis. Splenorenal varices. No enlarged abdominal or pelvic lymph nodes. Reproductive: Prostate is unremarkable. Other: No abdominal wall hernia or abnormality. No abdominopelvic ascites. Musculoskeletal: No acute or significant osseous findings. IMPRESSION: 1. Gallbladder wall thickening with stones and hyperdense intraluminal contents, findings are concerning for hemorrhagic cholecystitis. There is in intraluminal hyperdense focus which increases in size on delayed imaging, concerning for intraluminal active bleeding. 2. Cirrhotic liver morphology with evidence of portal hypertension including splenomegaly and varices. 3. Small lesions of the right hepatic lobe described on prior MRI not well visualized, likely due to contrast timing. 4. Aortic Atherosclerosis (ICD10-I70.0). Critical Value/emergent results were called by telephone at the time of interpretation on 04/16/2022 at 1:18 pm to provider [facility] , who verbally acknowledged these results. Electronically Signed   By: Yetta Glassman M.D.   On: 04/16/2022 13:19    Microbiology: Results for orders placed or performed during the hospital encounter of 05/02/22  Blood culture (routine x 2)     Status: None (Preliminary result)   Collection Time: 05/02/22  4:05 PM   Specimen: BLOOD  Result Value Ref Range Status   Specimen Description BLOOD BLOOD RIGHT HAND  Final   Special Requests   Final    BOTTLES DRAWN AEROBIC AND ANAEROBIC Blood Culture results may not be optimal due to an excessive volume of  blood received in culture bottles   Culture   Final    NO GROWTH 4 DAYS Performed at [facility], [address], [place], [place] [address]    Report Status PENDING  Incomplete  Body fluid culture w Gram Stain     Status: None   Collection Time: 05/02/22  8:08 PM   Specimen: JP Drain; Body Fluid  Result Value Ref Range Status   Specimen Description   Final    JP DRAINAGE FLUID Performed at [facility], [place] [address] [place], [place] [address]    Special Requests   Final    Normal Performed at [place], [place] [address]    Gram Stain   Final    FEW WBC PRESENT,BOTH PMN AND MONONUCLEAR RARE GRAM POSITIVE COCCI IN CLUSTERS Performed at [facility], [place] [address] [place], [place] [address]    Culture FEW STAPHYLOCOCCUS AUREUS  Final   Report Status 05/06/2022 FINAL  Final   Organism ID, Bacteria STAPHYLOCOCCUS AUREUS  Final      Susceptibility   Staphylococcus aureus - MIC*    CIPROFLOXACIN <=0.5 SENSITIVE Sensitive     ERYTHROMYCIN <=0.25 SENSITIVE Sensitive     GENTAMICIN <=0.5 SENSITIVE Sensitive     OXACILLIN 0.5 SENSITIVE Sensitive     TETRACYCLINE <=1 SENSITIVE Sensitive     VANCOMYCIN <=0.5 SENSITIVE Sensitive  TRIMETH/SULFA <=10 SENSITIVE Sensitive     CLINDAMYCIN <=0.25 SENSITIVE Sensitive     RIFAMPIN <=0.5 SENSITIVE Sensitive     Inducible Clindamycin NEGATIVE Sensitive     * FEW STAPHYLOCOCCUS AUREUS  Culture, blood (Routine X 2) w Reflex to ID Panel     Status: None (Preliminary result)   Collection Time: 05/02/22  8:30 PM   Specimen: BLOOD  Result Value Ref Range Status   Specimen Description BLOOD LEFT ANTECUBITAL  Final   Special Requests   Final    BOTTLES DRAWN AEROBIC AND ANAEROBIC Blood Culture results may not be optimal due to an excessive volume of blood received in culture bottles   Culture   Final    NO GROWTH 4 DAYS Performed at [facility], [address]  [place], [place] [address]    Report Status PENDING  Incomplete    Labs: CBC: Recent Labs  Lab 04/30/22 1321 05/02/22 1603 05/03/22 0427 05/04/22 0523  WBC 11.5* 13.2* 17.5* 17.0*  NEUTROABS 8.4*  --   --   --   HGB 15.6 15.3 14.5 13.4  HCT 45.9 44.1 42.8 39.0  MCV 88 88.2 91.6 90.1  PLT 201 174 146* 389*   Basic Metabolic Panel: Recent Labs  Lab 04/30/22 1321 05/02/22 1603 05/03/22 0427 05/04/22 0523  NA 131* 132* 130* 129*  K 4.5 4.6 4.1 4.4  CL 96 104 100 100  CO2 '24 22 24 25  '$ GLUCOSE 121* 123* 108* 120*  BUN '15 16 12 12  '$ CREATININE 0.78 0.59* 0.79 0.62  CALCIUM 8.6* 8.6* 8.1* 7.7*  MG  --   --   --  2.1  PHOS  --   --   --  2.8   Liver Function Tests: Recent Labs  Lab 04/30/22 1321 05/02/22 1603 05/03/22 0427 05/04/22 0523  AST 116* 88* 61* 61*  ALT 104* 75* 59* 57*  ALKPHOS 379* 270* 220* 209*  BILITOT 1.9* 1.8* 2.2* 2.4*  PROT 6.6 7.1 5.9* 5.5*  ALBUMIN 3.4* 3.7 3.0* 2.7*   CBG: No results for input(s): "GLUCAP" in the last 168 hours.  Discharge time spent: greater than 30 minutes.  Signed: Max Sane, MD [facility] 05/06/2022

## 2022-05-07 ENCOUNTER — Ambulatory Visit (INDEPENDENT_AMBULATORY_CARE_PROVIDER_SITE_OTHER): Payer: 59 | Admitting: Physician Assistant

## 2022-05-07 ENCOUNTER — Encounter: Payer: Self-pay | Admitting: Gastroenterology

## 2022-05-07 ENCOUNTER — Encounter: Payer: Self-pay | Admitting: Physician Assistant

## 2022-05-07 ENCOUNTER — Other Ambulatory Visit: Payer: Self-pay

## 2022-05-07 VITALS — BP 152/96 | HR 103 | Temp 98.2°F | Ht 70.0 in | Wt 194.0 lb

## 2022-05-07 DIAGNOSIS — K810 Acute cholecystitis: Secondary | ICD-10-CM

## 2022-05-07 DIAGNOSIS — Z09 Encounter for follow-up examination after completed treatment for conditions other than malignant neoplasm: Secondary | ICD-10-CM

## 2022-05-07 LAB — CULTURE, BLOOD (ROUTINE X 2)
Culture: NO GROWTH
Culture: NO GROWTH

## 2022-05-07 LAB — BODY FLUID CULTURE W GRAM STAIN: Special Requests: NORMAL

## 2022-05-07 MED ORDER — FUROSEMIDE 20 MG PO TABS: 40.0000 mg | ORAL_TABLET | Freq: Every day | ORAL | 3 refills | Status: DC

## 2022-05-07 NOTE — Progress Notes (Signed)
[facility] POST-OP OFFICE VISIT  05/07/2022  HPI: Brian Vasquez is a 46 y.o. male 21 days s/p robotic assisted laparoscopic cholecystectomy for hemorrhagic cholecystitis, complicated by history of liver failure on transplant list.    Again, reviewed recent admission from 10/28 - 10/30  He has done well at home No abdominal pain, fever, chills, nausea, emesis Some discomfort at drain itself Drain output has been <10 ccs and serous; recent CT without significant ascites He is tolerating PO No other complaints   Vital signs: BP (!) 152/96   Pulse (!) 103   Temp 98.2 F (36.8 C)   Ht '5\' 10"'$  (1.778 m)   Wt 194 lb (88 kg)   SpO2 99%   BMI 27.84 kg/m    Physical Exam: Constitutional: Well appearing male, NAD Abdomen: Soft, non-tender, non-distended, no rebound/guarding. Blake drain in right lateral port site; output serous; minimal Skin: Laparoscopic incisions are healing well, no erythema or drainage   Assessment/Plan: This is a 46 y.o. male 21 days s/p robotic assisted laparoscopic cholecystectomy for hemorrhagic cholecystitis, complicated by history of liver failure on transplant list.     - Drain removed; dressing placed. Reviewed wound care  - He is without fever nor pain; will defer on repeat imaging for now  - Complete Abx  - Pain control prn  - Reviewed lifting restrictions; 4 weeks total  - I will see him again in ~2 weeks to ensure he continues to do well; He understands to call with questions/concerns in the interim   -- Edison Simon, PA-C [facility] 05/07/2022, 2:59 PM M-F: 7am - 4pm

## 2022-05-07 NOTE — Telephone Encounter (Signed)
Yes 90 days with 2 refills

## 2022-05-07 NOTE — Patient Instructions (Addendum)
Leave a dressing on for 2 days. After that you should only need a Band-Aid if it is not fully closed.  Follow up here in two weeks.   GENERAL POST-OPERATIVE PATIENT INSTRUCTIONS   WOUND CARE INSTRUCTIONS:  Try to keep the wound dry and avoid ointments on the wound unless directed to do so.  If the wound becomes bright red and painful or starts to drain infected material that is not clear, please contact your physician immediately.  If the wound is mildly pink and has a thick firm ridge underneath it, this is normal, and is referred to as a healing ridge.  This will resolve over the next 4-6 weeks.  BATHING: You may shower if you have been informed of this by your surgeon. However, Please do not submerge in a tub, hot tub, or pool until incisions are completely sealed or have been told by your surgeon that you may do so.  DIET:  You may eat any foods that you can tolerate.  It is a good idea to eat a high fiber diet and take in plenty of fluids to prevent constipation.  If you do become constipated you may want to take a mild laxative or take ducolax tablets on a daily basis until your bowel habits are regular.  Constipation can be very uncomfortable, along with straining, after recent surgery.  ACTIVITY: You should not lift more than 20 pounds for 6 weeks total after surgery as it could put you at increased risk for complications.  Twenty pounds is roughly equivalent to a plastic bag of groceries. At that time- Listen to your body when lifting, if you have pain when lifting, stop and then try again in a few days. Soreness after doing exercises or activities of daily living is normal as you get back in to your normal routine.  MEDICATIONS:  Try to take narcotic medications and anti-inflammatory medications, such as tylenol, ibuprofen, naprosyn, etc., with food.  This will minimize stomach upset from the medication.  Should you develop nausea and vomiting from the pain medication, or develop a rash,  please discontinue the medication and contact your physician.  You should not drive, make important decisions, or operate machinery when taking narcotic pain medication.  SUNBLOCK Use sun block to incision area over the next year if this area will be exposed to sun. This helps decrease scarring and will allow you avoid a permanent darkened area over your incision.  QUESTIONS:  Please feel free to call our office if you have any questions, and we will be glad to assist you. [phone]

## 2022-05-21 ENCOUNTER — Telehealth: Payer: 59 | Admitting: Gastroenterology

## 2022-05-21 ENCOUNTER — Encounter: Payer: 59 | Admitting: Physician Assistant

## 2022-05-21 DIAGNOSIS — R188 Other ascites: Secondary | ICD-10-CM

## 2022-05-21 DIAGNOSIS — K7460 Unspecified cirrhosis of liver: Secondary | ICD-10-CM | POA: Diagnosis not present

## 2022-05-21 NOTE — Progress Notes (Signed)
Brian Vasquez , MD [address]  [place]  [place], [place] [address]  Main: [phone]  Fax: [phone]   Primary Care Physician: Wayland Denis, PA-C  Virtual Visit via Video Note  I connected with patient on 05/21/22 at  3:00 PM EST by video and verified that I am speaking with the correct person using two identifiers.   I discussed the limitations, risks, security and privacy concerns of performing an evaluation and management service by video  and the availability of in person appointments. I also discussed with the patient that there may be a patient responsible charge related to this service. The patient expressed understanding and agreed to proceed.  Location of Patient: Home Location of Provider: Home Persons involved: Patient and provider only   History of Present Illness: Chief Complaint  Patient presents with   Ascites    HPI: Brian Vasquez is a 46 y.o. male  Summary of history :   He has been followed for alcoholic liver disease as well as severe deficiency of alpha 1 antitrypsin as a combination causing cirrhosis, decompensated ascites in 2022, nonbleeding esophageal varices that have been banded, concern for masslike lesion on ultrasound but no mass seen on MRI AFP elevated follow-up with Dr. Manuella Ghazi at [facility].  Biopsy was benign.   I initially met him in the hospital when he was admitted in October 2022 when he presented with ascites.ultrasound which showed cirrhosis with a 4.9 cm masslike lesion in the caudate lobe.  He underwent an MRI of the liver without contrast and with contrast to look at the lesion and showed no concern for malignancy simply noted focal nodular area of the liver.  Plan was to correlate with AFP which was elevated at 79.5.  Ascetic fluid showed no evidence of SBP and likely was due to portal hypertension.  INR was 1.7.  Hemoglobin 13.4.  Hepatitis B core antibody was positive, hepatitis C antibody , hepatitis B surface antibody was  negative .    Admitted to the hospital for hyponatremia and discharged on 05/12/2021. Likely secondary to combination of over diuresis and salt restriction. He didn't get his labs a few days after his last increase in diurertics.    05/12/2021: EGD:  3 bands placed over non bleeding large esophageal varices. Duodenal bx shows no evidence of celiac disease.   Low alpha 1 antitrypsin levels, Elevated Ferritin and iron % saturation 88. INR 1.3 Ascitic fluid  shows SAAG suggestive of portal hypertension. AFP elevated 63.  MRI showed no clear mass but since AFP still elevated will plan further evaluation   05/29/2021 MRI liver with and without contrast shows no evidence of HCC but does another area which shows hyperenhancement and repeat MRI in 3 months has been recommended.  Features of portal hypertension seen.  07/11/2021: Liver biopsy shows features of alpha-1 antitrypsin deficiency, chronic cirrhotic liver with inflammatory activity no dysplastic nodule or carcinoma seen. 08/22/2021 MRI liver with contrast shows no significant change since previous study.  LI-RADS 3 lesion noted.  Mild volume ascites.  Features of portal hypertension including splenomegaly and varices.   08/20/2021 EGD: Severe portal hypertensive gastropathy.  No esophageal varices 08/28/2021 was seen by [facility] for alpha-1 antitrypsin deficiency recommended getting vaccinations for hepatitis A and B.  09/23/2021: Followed up with Dr. Manuella Ghazi at [facility] had an MRI showing cirrhosis with portal hypertension 1.2 cm hepatic segment nodule compatible with benign biopsy no focal hepatic lesions 02/23/2022: EGD: Portal hypertensive gastropathy no esophageal varices  repeat EGD in 1 year   02/22/2022: Liver mass previously seen stable recommended repeat MRI in 6 months   03/25/2022 visit with Dr. Manuella Ghazi at [facility].  MELD score of 10 following with specialist for alpha-1 antitrypsin deficiency at pulmonary clinic on Lasix 20 mg and eplerenone 100 mg not required a  paracentesis since January 2023 no recent episodes of hepatic encephalopathy  04/15/2022: Colonoscopy: Two 4 to 5 mm polyps in the ascending colon removed with a cold snare.  There were hyperplastic   04/16/2022 presented to the hospital with right upper quadrant pain.  Was diagnosed with hemorrhagic cholecystitis.  Underwent laparoscopic cholecystectomy with Dr. Dahlia Byes.  Developed ascites commenced on 40 mg of Lasix a day.  Path report showed acute on chronic cholecystitis without cholelithiasis.    Interval history 04/30/2022-05/21/2022   05/04/2022: Seen Dr. 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  Bowel movements normal  Sleeping not too well , intermittent sleep . Gets 6-7 hours a night .  No NSAID's    Current Outpatient Medications  Medication Sig Dispense Refill   eplerenone (INSPRA) 50 MG tablet Take 100 mg by mouth daily.     folic acid (FOLVITE) 1 MG tablet Take 1 tablet by mouth daily.     furosemide (LASIX) 20 MG tablet Take 2 tablets (40 mg total) by mouth daily. 90 tablet 3   thiamine 100 MG tablet Take 1 tablet (100 mg total) by mouth daily.     Current Facility-Administered Medications  Medication Dose Route Frequency Provider Last Rate Last Admin   albumin human 25 % solution 25 g  25 g Intravenous Once Brian Bellows, MD        Allergies as of 05/21/2022   (No Known Allergies)    Review of Systems:    All systems reviewed and negative except where noted in HPI.  General Appearance:    Alert, cooperative, no distress, appears stated age  Head:    Normocephalic, without obvious abnormality, atraumatic  Eyes:    PERRL, conjunctiva/corneas clear,  Ears:    Grossly normal hearing    Neurologic:  Grossly normal     Observations/Objective:  Labs: CMP     Component Value Date/Time   NA 129 (L) 05/04/2022 0523   NA 131 (L) 04/30/2022 1321   K 4.4 05/04/2022 0523   CL 100 05/04/2022 0523   CO2 25 05/04/2022 0523   GLUCOSE 120 (H) 05/04/2022 0523   BUN 12 05/04/2022 0523   BUN 15 04/30/2022 1321   CREATININE 0.62 05/04/2022 0523   CALCIUM 7.7 (L) 05/04/2022 0523   PROT 5.5 (L) 05/04/2022 0523   PROT 6.6 04/30/2022 1321   ALBUMIN 2.7 (L) 05/04/2022 0523   ALBUMIN 3.4 (L) 04/30/2022 1321   AST 61 (H) 05/04/2022 0523   ALT 57 (H) 05/04/2022 0523   ALKPHOS 209 (H) 05/04/2022 0523   BILITOT 2.4 (H) 05/04/2022 0523   BILITOT 1.9 (H) 04/30/2022 1321   GFRNONAA >60 05/04/2022 0523   Lab Results  Component Value Date   WBC 17.0 (H) 05/04/2022   HGB 13.4 05/04/2022   HCT 39.0 05/04/2022   MCV 90.1 05/04/2022   PLT 141 (L) 05/04/2022    Imaging Studies: NM HEPATOBILIARY LEAK (POST-SURGICAL)  Result Date: 05/03/2022 CLINICAL DATA:  Postoperative abdominal pain, cholecystectomy 04/16/2022. Cirrhosis. EXAM: NUCLEAR MEDICINE HEPATOBILIARY IMAGING TECHNIQUE: Sequential images of the abdomen were obtained out to 60 minutes following intravenous administration of radiopharmaceutical. RADIOPHARMACEUTICALS:  7.5 mCi Tc-89m Choletec IV COMPARISON:  CT scan 05/02/2022 FINDINGS: There is satisfactory uptake of radiopharmaceutical from the blood pool. Biliary and bowel activity observed at 5 minutes with progressive excretion into bowel. Over the 2 hour observation, no findings suggestive of bile leak are identified. IMPRESSION: 1. No findings of bowel leak during 2 hour hepatobiliary imaging. Electronically Signed   By: WVan ClinesM.D.   On: 05/03/2022 15:44   CT ABDOMEN PELVIS W CONTRAST  Result Date: 05/02/2022 CLINICAL DATA:  Right-sided abdominal pain. EXAM: CT ABDOMEN AND PELVIS WITH CONTRAST TECHNIQUE: Multidetector CT imaging of the abdomen and pelvis was performed using the standard  protocol following bolus administration of intravenous contrast. RADIATION DOSE REDUCTION: This exam was performed according to the departmental dose-optimization program which includes automated exposure control, adjustment of the mA and/or kV according to patient size and/or use of iterative reconstruction technique. CONTRAST:  1065mOMNIPAQUE IOHEXOL 300 MG/ML  SOLN COMPARISON:  04/16/2022 FINDINGS: Lower chest: Dependent atelectasis noted in the lung bases. Hepatobiliary: Nodular liver contour compatible with the reported clinical history of cirrhosis. 6.0 x 3.2 x 5.2 cm relatively well-defined fluid collection is identified in the gallbladder fossa. Fluid collection shows subtle peripheral/rim enhancement. Amorphous areas of increased attenuation within this fluid likely represent packing material although hemorrhagic debris or drop stones cannot be entirely excluded. Fluid collection tracks posteriorly and inferiorly along the capsule of the liver in Morison's pouch. No intrahepatic or extrahepatic biliary dilation. Pancreas: No focal mass lesion. No dilatation of the main duct. No intraparenchymal cyst. No peripancreatic edema. Spleen: Spleen is enlarged. Adrenals/Urinary Tract: No adrenal nodule or mass. Kidneys unremarkable. No evidence for hydroureter. The urinary bladder appears normal for the degree of distention. Stomach/Bowel: Stomach is unremarkable. No gastric wall thickening. No evidence of outlet obstruction. Duodenum is normally positioned as is the ligament of Treitz. Jejunal loops in fossa. The left upper quadrant for mildly distended and filled with fluid. Pattern is nonobstructive. Ileal loops are decompressed. The terminal ileum is normal. The appendix is normal. Mild wall thickening in the hepatic flexure of the colon is likely secondary. Left colon unremarkable. Vascular/Lymphatic: No abdominal aortic aneurysm. Portal vein is patent. Recanalization of the paraumbilical vein is consistent  with portal venous hypertension. Paraesophageal varices noted. No abdominal aortic atherosclerotic calcification. Scattered small retroperitoneal lymph nodes evident. No pelvic sidewall lymphadenopathy. Reproductive: The prostate gland and seminal vesicles are unremarkable. Other: Trace free fluid is seen in the pelvis. Edema/inflammation is seen in the right upper quadrant. Surgical drain is seen along the inferior aspect of the right liver tracking up towards the gallbladder Musculoskeletal: No worrisome lytic or sclerotic osseous abnormality. IMPRESSION: 1. 6.0 x 3.2 x 5.2 cm relatively well-defined fluid collection in the gallbladder fossa. Fluid collection shows subtle peripheral/rim enhancement. Amorphous areas of increased attenuation within this fluid likely represent packing material although hemorrhagic debris or dropped stones cannot be entirely excluded. Evolving abscess is a concern. Fluid collection tracks posteriorly and inferiorly along the capsule of the liver into Morison's pouch. 2. Cirrhotic liver with evidence of portal venous hypertension including splenomegaly, recanalization of the paraumbilical vein and paraesophageal varices. 3. Trace free fluid in the pelvis. 4. Mild wall thickening in the hepatic flexure of the colon is likely secondary. Electronically Signed  By: Misty Stanley M.D.   On: 05/02/2022 17:52    Assessment and Plan:   Brian Vasquez is a 46 y.o. y/o male  has a history of alcoholic liver and alpha 1 antitrypsin deficiency related cirrhosis, decompensation with ascites presented to the ER in October 2022 with abdominal distention and acute liver failure, non bleeding esophageal varices that have been banded.     Concern for masslike lesion on ultrasound but no mass seen on MRI but AFP has been elevated, follow-up with Dr. Manuella Ghazi at [facility] biopsy was benign. Did not tolerate 100 mg of Aldactone and 40 mg of Lasix as he developed gynecomastia, on eplerenone 100 mg and Lasix  40 mg positive tissue transglutaminase antibody but negative biopsies of the duodenum for celiac disease.  Tests for autoimmune liver disease was negative.  Elevated ferritin, very low alpha-1 antitrypsin levels, persistently elevated AFP, no recent episodes of hepatic encephalopathy.  Developed hemorrhagic cholecystitis on 04/16/2022 underwent cholecystectomy since then developed ascites, the ascites appears clear.  He did receive a normal saline as he had an intraoperative complete likely related to that he has developed ascites.  He is on a low-sodium diet.  Explained to him that we should give him some albumin as an infusion to prevent hepatorenal syndrome in addition check his labs and advised him to check his daily weights with, maintain a record of fluid being drained   Plan 1.  No NSAIDs, check cbc, cmp,INR 2.  On eplerenone 100 mg a day and Lasix 40 mg a day , check cbc, CMP,INR today and following which we will reduce dose of diuretics.  4.  Low-sodium diet very compliant 5.  MRI liver shows stable liver mass repeat in February 2024 orders in place  6.  EGD for surveillance of esophageal varices August 2024 7.  Continue to stay off all alcohol: Has been in remission for many months and doing very well sarcopenia has improved and he has normal-appearing muscle mass 8.  Complete hepatitis B vaccination series with his primary care physician received 2 or 3 doses 9.  Maintain a chart with daily weights       I discussed the assessment and treatment plan with the patient. The patient was provided an opportunity to ask questions and all were answered. The patient agreed with the plan and demonstrated an understanding of the instructions.   The patient was advised to call back or seek an in-person evaluation if the symptoms worsen or if the condition fails to improve as anticipated.  I provided 15  minutes of face-to-face time during this encounter.  Dr Brian Bellows MD,MRCP  [facility]) Gastroenterology/Hepatology Pager: [phone]   Speech recognition software was used to dictate this note.

## 2022-05-22 ENCOUNTER — Other Ambulatory Visit: Payer: Self-pay | Admitting: Gastroenterology

## 2022-05-26 ENCOUNTER — Other Ambulatory Visit: Payer: Self-pay

## 2022-05-26 ENCOUNTER — Encounter: Payer: Self-pay | Admitting: Physician Assistant

## 2022-05-26 ENCOUNTER — Ambulatory Visit (INDEPENDENT_AMBULATORY_CARE_PROVIDER_SITE_OTHER): Payer: 59 | Admitting: Physician Assistant

## 2022-05-26 VITALS — BP 135/92 | HR 87 | Temp 98.3°F | Ht 70.0 in | Wt 187.0 lb

## 2022-05-26 DIAGNOSIS — K810 Acute cholecystitis: Secondary | ICD-10-CM

## 2022-05-26 DIAGNOSIS — Z09 Encounter for follow-up examination after completed treatment for conditions other than malignant neoplasm: Secondary | ICD-10-CM

## 2022-05-26 DIAGNOSIS — K8000 Calculus of gallbladder with acute cholecystitis without obstruction: Secondary | ICD-10-CM

## 2022-05-26 NOTE — Progress Notes (Signed)
[facility] POST-OP OFFICE VISIT  05/26/2022  HPI: Brian Vasquez is a 46 y.o. male >1 month s/p robotic assisted laparoscopic cholecystectomy for hemorrhagic cholecystitis, complicated by history of liver failure on transplant list.     From a surgical perspective, he seems to now be fully recovered Having some sensitivities to food but this is a recurring thing for him given his liver disease No fever, chills, nausea, emesis, diarrhea Incisions are healed  Vital signs: BP (!) 135/92   Pulse 87   Temp 98.3 F (36.8 C) (Oral)   Ht '5\' 10"'$  (1.778 m)   Wt 187 lb (84.8 kg)   SpO2 98%   BMI 26.83 kg/m    Physical Exam: Constitutional: Well appearing male, NAD Abdomen: Soft, non-tender, non-distended, no rebound/guarding Skin: Laparoscopic incisions are healing well, no erythema or drainage   Assessment/Plan: This is a 46 y.o. male >1 month s/p robotic assisted laparoscopic cholecystectomy for hemorrhagic cholecystitis, complicated by history of liver failure on transplant list.      - Nothing further to add  - He can follow up on as needed basis; He understands to call with questions/concerns  -- Edison Simon, PA-C [facility] 05/26/2022, 3:20 PM M-F: 7am - 4pm

## 2022-05-26 NOTE — Patient Instructions (Signed)

## 2022-06-01 ENCOUNTER — Telehealth (INDEPENDENT_AMBULATORY_CARE_PROVIDER_SITE_OTHER): Payer: 59 | Admitting: Gastroenterology

## 2022-06-01 DIAGNOSIS — R188 Other ascites: Secondary | ICD-10-CM | POA: Diagnosis not present

## 2022-06-01 DIAGNOSIS — K7460 Unspecified cirrhosis of liver: Secondary | ICD-10-CM

## 2022-06-01 DIAGNOSIS — E8801 Alpha-1-antitrypsin deficiency: Secondary | ICD-10-CM

## 2022-06-01 NOTE — Progress Notes (Signed)
Brian Vasquez , MD [address]  [place]  [place], [place] [address]  Main: [phone]  Fax: [phone]   Primary Care Physician: Wayland Denis, PA-C  Virtual Visit via Video Note  I connected with patient on 06/01/22 at  1:00 PM EST by video and verified that I am speaking with the correct person using two identifiers.   I discussed the limitations, risks, security and privacy concerns of performing an evaluation and management service by video  and the availability of in person appointments. I also discussed with the patient that there may be a patient responsible charge related to this service. The patient expressed understanding and agreed to proceed.  Location of Patient: Home Location of Provider: Home Persons involved: Patient and provider only   History of Present Illness: Chief Complaint  Patient presents with   Cirrhosis    HPI: Brian Vasquez is a 46 y.o. male   Summary of history :  He has been followed for alcoholic liver disease as well as severe deficiency of alpha 1 antitrypsin as a combination causing cirrhosis, decompensated ascites in 2022, nonbleeding esophageal varices that have been banded, concern for masslike lesion on ultrasound but no mass seen on MRI AFP elevated follow-up with Dr. Manuella Ghazi at [facility].  Biopsy was benign.   I initially met him in the hospital when he was admitted in October 2022 when he presented with ascites.ultrasound which showed cirrhosis with a 4.9 cm masslike lesion in the caudate lobe.  He underwent an MRI of the liver without contrast and with contrast to look at the lesion and showed no concern for malignancy simply noted focal nodular area of the liver.  Plan was to correlate with AFP which was elevated at 79.5.  Ascetic fluid showed no evidence of SBP and likely was due to portal hypertension.  INR was 1.7.  Hemoglobin 13.4.  Hepatitis B core antibody was positive, hepatitis C antibody , hepatitis B surface antibody was  negative .    Admitted to the hospital for hyponatremia and discharged on 05/12/2021. Likely secondary to combination of over diuresis and salt restriction. He didn't get his labs a few days after his last increase in diurertics.    05/12/2021: EGD:  3 bands placed over non bleeding large esophageal varices. Duodenal bx shows no evidence of celiac disease.   Low alpha 1 antitrypsin levels, Elevated Ferritin and iron % saturation 88. INR 1.3 Ascitic fluid  shows SAAG suggestive of portal hypertension. AFP elevated 63.  MRI showed no clear mass but since AFP still elevated will plan further evaluation   05/29/2021 MRI liver with and without contrast shows no evidence of HCC but does another area which shows hyperenhancement and repeat MRI in 3 months has been recommended.  Features of portal hypertension seen.  07/11/2021: Liver biopsy shows features of alpha-1 antitrypsin deficiency, chronic cirrhotic liver with inflammatory activity no dysplastic nodule or carcinoma seen. 08/22/2021 MRI liver with contrast shows no significant change since previous study.  LI-RADS 3 lesion noted.  Mild volume ascites.  Features of portal hypertension including splenomegaly and varices.   08/20/2021 EGD: Severe portal hypertensive gastropathy.  No esophageal varices 08/28/2021 was seen by [facility] for alpha-1 antitrypsin deficiency recommended getting vaccinations for hepatitis A and B.  09/23/2021: Followed up with Dr. Manuella Ghazi at [facility] had an MRI showing cirrhosis with portal hypertension 1.2 cm hepatic segment nodule compatible with benign biopsy no focal hepatic lesions 02/23/2022: EGD: Portal hypertensive gastropathy no esophageal varices  repeat EGD in 1 year   02/22/2022: Liver mass previously seen stable recommended repeat MRI in 6 months   03/25/2022 visit with Dr. Manuella Ghazi at [facility].  MELD score of 10 following with specialist for alpha-1 antitrypsin deficiency at pulmonary clinic on Lasix 20 mg and eplerenone 100 mg not required a  paracentesis since January 2023 no recent episodes of hepatic encephalopathy  04/15/2022: Colonoscopy: Two 4 to 5 mm polyps in the ascending colon removed with a cold snare.  There were hyperplastic   04/16/2022 presented to the hospital with right upper quadrant pain.  Was diagnosed with hemorrhagic cholecystitis.  Underwent laparoscopic cholecystectomy with Dr. Dahlia Byes.  Developed ascites commenced on 40 mg of Lasix a day.  Path report showed acute on chronic cholecystitis without cholelithiasis.   05/04/2022: Seen Dr. 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  He has not yet obtained the labs which were ordered the last time he will get it done tomorrow no other complaints today presently       Current Outpatient Medications  Medication Sig Dispense Refill   eplerenone (INSPRA) 50 MG tablet Take 100 mg by mouth daily.     folic acid (FOLVITE) 1 MG tablet Take 1 tablet by mouth daily.     furosemide (LASIX) 20 MG tablet Take 2 tablets (40 mg total) by mouth daily. 90 tablet 3   thiamine 100 MG tablet Take 1 tablet (100 mg total) by mouth daily.     Current Facility-Administered Medications  Medication Dose Route Frequency Provider Last Rate Last Admin   albumin human 25 % solution 25 g  25 g Intravenous Once Brian Bellows, MD        Allergies as of 06/01/2022   (No Known Allergies)    Review of Systems:    All systems reviewed and negative except where noted in HPI.  General Appearance:    Alert, cooperative, no distress, appears stated age  Head:    Normocephalic, without obvious abnormality, atraumatic  Eyes:    PERRL,  conjunctiva/corneas clear,  Ears:    Grossly normal hearing    Neurologic:  Grossly normal    Observations/Objective:  Labs: CMP     Component Value Date/Time   NA 129 (L) 05/04/2022 0523   NA 131 (L) 04/30/2022 1321   K 4.4 05/04/2022 0523   CL 100 05/04/2022 0523   CO2 25 05/04/2022 0523   GLUCOSE 120 (H) 05/04/2022 0523   BUN 12 05/04/2022 0523   BUN 15 04/30/2022 1321   CREATININE 0.62 05/04/2022 0523   CALCIUM 7.7 (L) 05/04/2022 0523   PROT 5.5 (L) 05/04/2022 0523   PROT 6.6 04/30/2022 1321   ALBUMIN 2.7 (L) 05/04/2022 0523   ALBUMIN 3.4 (L) 04/30/2022 1321   AST 61 (H) 05/04/2022 0523   ALT 57 (H) 05/04/2022 0523   ALKPHOS 209 (H) 05/04/2022 0523   BILITOT 2.4 (H) 05/04/2022 0523   BILITOT 1.9 (H) 04/30/2022 1321   GFRNONAA >60 05/04/2022 0523   Lab Results  Component Value Date   WBC 17.0 (H) 05/04/2022   HGB 13.4 05/04/2022   HCT 39.0 05/04/2022  MCV 90.1 05/04/2022   PLT 141 (L) 05/04/2022    Imaging Studies: NM HEPATOBILIARY LEAK (POST-SURGICAL)  Result Date: 05/03/2022 CLINICAL DATA:  Postoperative abdominal pain, cholecystectomy 04/16/2022. Cirrhosis. EXAM: NUCLEAR MEDICINE HEPATOBILIARY IMAGING TECHNIQUE: Sequential images of the abdomen were obtained out to 60 minutes following intravenous administration of radiopharmaceutical. RADIOPHARMACEUTICALS:  7.5 mCi Tc-60m Choletec IV COMPARISON:  CT scan 05/02/2022 FINDINGS: There is satisfactory uptake of radiopharmaceutical from the blood pool. Biliary and bowel activity observed at 5 minutes with progressive excretion into bowel. Over the 2 hour observation, no findings suggestive of bile leak are identified. IMPRESSION: 1. No findings of bowel leak during 2 hour hepatobiliary imaging. Electronically Signed   By: WVan ClinesM.D.   On: 05/03/2022 15:44   CT ABDOMEN PELVIS W CONTRAST  Result Date: 05/02/2022 CLINICAL DATA:  Right-sided abdominal pain. EXAM: CT ABDOMEN AND PELVIS WITH CONTRAST  TECHNIQUE: Multidetector CT imaging of the abdomen and pelvis was performed using the standard protocol following bolus administration of intravenous contrast. RADIATION DOSE REDUCTION: This exam was performed according to the departmental dose-optimization program which includes automated exposure control, adjustment of the mA and/or kV according to patient size and/or use of iterative reconstruction technique. CONTRAST:  1074mOMNIPAQUE IOHEXOL 300 MG/ML  SOLN COMPARISON:  04/16/2022 FINDINGS: Lower chest: Dependent atelectasis noted in the lung bases. Hepatobiliary: Nodular liver contour compatible with the reported clinical history of cirrhosis. 6.0 x 3.2 x 5.2 cm relatively well-defined fluid collection is identified in the gallbladder fossa. Fluid collection shows subtle peripheral/rim enhancement. Amorphous areas of increased attenuation within this fluid likely represent packing material although hemorrhagic debris or drop stones cannot be entirely excluded. Fluid collection tracks posteriorly and inferiorly along the capsule of the liver in Morison's pouch. No intrahepatic or extrahepatic biliary dilation. Pancreas: No focal mass lesion. No dilatation of the main duct. No intraparenchymal cyst. No peripancreatic edema. Spleen: Spleen is enlarged. Adrenals/Urinary Tract: No adrenal nodule or mass. Kidneys unremarkable. No evidence for hydroureter. The urinary bladder appears normal for the degree of distention. Stomach/Bowel: Stomach is unremarkable. No gastric wall thickening. No evidence of outlet obstruction. Duodenum is normally positioned as is the ligament of Treitz. Jejunal loops in fossa. The left upper quadrant for mildly distended and filled with fluid. Pattern is nonobstructive. Ileal loops are decompressed. The terminal ileum is normal. The appendix is normal. Mild wall thickening in the hepatic flexure of the colon is likely secondary. Left colon unremarkable. Vascular/Lymphatic: No abdominal  aortic aneurysm. Portal vein is patent. Recanalization of the paraumbilical vein is consistent with portal venous hypertension. Paraesophageal varices noted. No abdominal aortic atherosclerotic calcification. Scattered small retroperitoneal lymph nodes evident. No pelvic sidewall lymphadenopathy. Reproductive: The prostate gland and seminal vesicles are unremarkable. Other: Trace free fluid is seen in the pelvis. Edema/inflammation is seen in the right upper quadrant. Surgical drain is seen along the inferior aspect of the right liver tracking up towards the gallbladder Musculoskeletal: No worrisome lytic or sclerotic osseous abnormality. IMPRESSION: 1. 6.0 x 3.2 x 5.2 cm relatively well-defined fluid collection in the gallbladder fossa. Fluid collection shows subtle peripheral/rim enhancement. Amorphous areas of increased attenuation within this fluid likely represent packing material although hemorrhagic debris or dropped stones cannot be entirely excluded. Evolving abscess is a concern. Fluid collection tracks posteriorly and inferiorly along the capsule of the liver into Morison's pouch. 2. Cirrhotic liver with evidence of portal venous hypertension including splenomegaly, recanalization of the paraumbilical vein and paraesophageal varices. 3. Trace free fluid in  the pelvis. 4. Mild wall thickening in the hepatic flexure of the colon is likely secondary. Electronically Signed   By: Misty Stanley M.D.   On: 05/02/2022 17:52    Assessment and Plan:   Brian Vasquez is a 46 y.o. y/o male has a history of alcoholic liver and alpha 1 antitrypsin deficiency related cirrhosis, decompensation with ascites presented to the ER in October 2022 with abdominal distention and acute liver failure, non bleeding esophageal varices that have been banded.     Concern for masslike lesion on ultrasound but no mass seen on MRI but AFP has been elevated, follow-up with Dr. Manuella Ghazi at [facility] biopsy was benign. Did not tolerate 100 mg  of Aldactone and 40 mg of Lasix as he developed gynecomastia, on eplerenone 100 mg and Lasix 40 mg positive tissue transglutaminase antibody but negative biopsies of the duodenum for celiac disease.  Tests for autoimmune liver disease was negative.  Elevated ferritin, very low alpha-1 antitrypsin levels, persistently elevated AFP, no recent episodes of hepatic encephalopathy.  Developed hemorrhagic cholecystitis on 04/16/2022 underwent cholecystectomy since then developed ascites, the ascites appears clear.  He did receive a normal saline as he had an intraoperative complete likely related to that he has developed ascites.  He is on a low-sodium diet.    Plan 1.  No NSAIDs, check cbc, cmp,INR 2.  On eplerenone 100 mg a day and Lasix 40 mg a day  4.  Low-sodium diet very compliant 5.  MRI liver shows stable liver mass repeat in February 2024 orders in place  6.  EGD for surveillance of esophageal varices August 2024 7.  Continue to stay off all alcohol: Has been in remission for many months and doing very well sarcopenia has improved and he has normal-appearing muscle mass 8.  Complete hepatitis B vaccination series with his primary care physician received 2 or 3 doses 9.  Maintain a chart with daily weights and we will touch base in 3 weeks again        I discussed the assessment and treatment plan with the patient. The patient was provided an opportunity to ask questions and all were answered. The patient agreed with the plan and demonstrated an understanding of the instructions.   The patient was advised to call back or seek an in-person evaluation if the symptoms worsen or if the condition fails to improve as anticipated.  I provided 20 minutes of face-to-face time during this encounter.  Dr Brian Bellows MD,MRCP [facility]) Gastroenterology/Hepatology Pager: [phone]   Speech recognition software was used to dictate this note.

## 2022-06-02 ENCOUNTER — Other Ambulatory Visit: Payer: Self-pay | Admitting: Gastroenterology

## 2022-06-03 LAB — COMPREHENSIVE METABOLIC PANEL
ALT: 53 IU/L — ABNORMAL HIGH (ref 0–44)
AST: 69 IU/L — ABNORMAL HIGH (ref 0–40)
Albumin/Globulin Ratio: 1.2 (ref 1.2–2.2)
Albumin: 3.9 g/dL — ABNORMAL LOW (ref 4.1–5.1)
Alkaline Phosphatase: 215 IU/L — ABNORMAL HIGH (ref 44–121)
BUN/Creatinine Ratio: 11 (ref 9–20)
BUN: 8 mg/dL (ref 6–24)
Bilirubin Total: 1.5 mg/dL — ABNORMAL HIGH (ref 0.0–1.2)
CO2: 24 mmol/L (ref 20–29)
Calcium: 9.3 mg/dL (ref 8.7–10.2)
Chloride: 104 mmol/L (ref 96–106)
Creatinine, Ser: 0.71 mg/dL — ABNORMAL LOW (ref 0.76–1.27)
Globulin, Total: 3.2 g/dL (ref 1.5–4.5)
Glucose: 93 mg/dL (ref 70–99)
Potassium: 4.3 mmol/L (ref 3.5–5.2)
Sodium: 140 mmol/L (ref 134–144)
Total Protein: 7.1 g/dL (ref 6.0–8.5)
eGFR: 114 mL/min/{1.73_m2} (ref >59)

## 2022-06-03 LAB — CBC WITH DIFFERENTIAL/PLATELET
Basophils Absolute: 0 10*3/uL (ref 0.0–0.2)
Basos: 1 %
EOS (ABSOLUTE): 0.1 10*3/uL (ref 0.0–0.4)
Eos: 2 %
Hematocrit: 45.6 % (ref 37.5–51.0)
Hemoglobin: 15.6 g/dL (ref 13.0–17.7)
Immature Grans (Abs): 0 10*3/uL (ref 0.0–0.1)
Immature Granulocytes: 0 %
Lymphocytes Absolute: 2.1 10*3/uL (ref 0.7–3.1)
Lymphs: 32 %
MCH: 29.8 pg (ref 26.6–33.0)
MCHC: 34.2 g/dL (ref 31.5–35.7)
MCV: 87 fL (ref 79–97)
Monocytes Absolute: 0.6 10*3/uL (ref 0.1–0.9)
Monocytes: 9 %
Neutrophils Absolute: 3.7 10*3/uL (ref 1.4–7.0)
Neutrophils: 56 %
Platelets: 144 10*3/uL — ABNORMAL LOW (ref 150–450)
RBC: 5.23 x10E6/uL (ref 4.14–5.80)
RDW: 13.8 % (ref 11.6–15.4)
WBC: 6.5 10*3/uL (ref 3.4–10.8)

## 2022-06-03 LAB — HEPATITIS B SURFACE ANTIBODY,QUALITATIVE: Hep B Surface Ab, Qual: NONREACTIVE

## 2022-06-03 LAB — PROTIME-INR
INR: 1.2 (ref 0.9–1.2)
Prothrombin Time: 13.3 s — ABNORMAL HIGH (ref 9.1–12.0)

## 2022-06-03 NOTE — Progress Notes (Signed)
Labs stable stay on aldactone 50 mg - daily weights and if he can gibve me his weight chart in a week we can discuss further dosing

## 2022-06-04 NOTE — Telephone Encounter (Signed)
My error  I know he is on Inspra i.e. eplerenone. His labs from day before yesterday are doing very well We have 2 options at this point of time  If his weight is stable we will stay on the current dose If his weight is gradually coming down we may need to cut down on both his medications i.e. Lasix and eplerenone  Can he give me his weight from the same scale what he has been checking from a few days back and from a week back?  We can discuss via MyChart after I get the weight results

## 2022-06-04 NOTE — Telephone Encounter (Signed)
Thanks

## 2022-06-22 ENCOUNTER — Telehealth (INDEPENDENT_AMBULATORY_CARE_PROVIDER_SITE_OTHER): Payer: 59 | Admitting: Gastroenterology

## 2022-06-22 DIAGNOSIS — K7460 Unspecified cirrhosis of liver: Secondary | ICD-10-CM

## 2022-06-22 DIAGNOSIS — K7031 Alcoholic cirrhosis of liver with ascites: Secondary | ICD-10-CM | POA: Diagnosis not present

## 2022-06-22 DIAGNOSIS — R188 Other ascites: Secondary | ICD-10-CM

## 2022-06-22 MED ORDER — EPLERENONE 50 MG PO TABS: 100.0000 mg | ORAL_TABLET | Freq: Every day | ORAL | 2 refills | Status: DC

## 2022-06-22 MED ORDER — FUROSEMIDE 20 MG PO TABS: 40.0000 mg | ORAL_TABLET | Freq: Every day | ORAL | 3 refills | Status: DC

## 2022-06-22 NOTE — Progress Notes (Signed)
Brian Vasquez , MD [address]  [place]  [place], [place] [address]  Main: [phone]  Fax: [phone]   Primary Care Physician: Wayland Denis, PA-C  Virtual Visit via Video Note  I connected with patient on 06/22/22 at  1:00 PM EST by video and verified that I am speaking with the correct person using two identifiers.   I discussed the limitations, risks, security and privacy concerns of performing an evaluation and management service by video  and the availability of in person appointments. I also discussed with the patient that there may be a patient responsible charge related to this service. The patient expressed understanding and agreed to proceed.  Location of Patient: Home Location of Provider: Home Persons involved: Patient and provider only   History of Present Illness: Chief Complaint  Patient presents with   Ascites    HPI: Brian Vasquez is a 46 y.o. male   Summary of history :   He has been followed for alcoholic liver disease as well as severe deficiency of alpha 1 antitrypsin as a combination causing cirrhosis, decompensated ascites in 2022, nonbleeding esophageal varices that have been banded since November 2022, concern for masslike lesion on ultrasound but no mass seen on MRI AFP elevated follow-up with Dr. Manuella Ghazi at [facility].  Biopsy was benign.   I initially met him in the hospital when he was admitted in October 2022 when he presented with ascites.ultrasound which showed cirrhosis with a 4.9 cm masslike lesion in the caudate lobe.  He underwent an MRI of the liver without contrast and with contrast to look at the lesion and showed no concern for malignancy simply noted focal nodular area of the liver.  Plan was to correlate with AFP which was elevated at 79.5.  Ascetic fluid showed no evidence of SBP and likely was due to portal hypertension.  INR was 1.7.  Hemoglobin 13.4.  Hepatitis B core antibody was positive, hepatitis C antibody , hepatitis B  surface antibody was negative .    Admitted to the hospital for hyponatremia and discharged on 05/12/2021. Likely secondary to combination of over diuresis and salt restriction. He didn't get his labs a few days after his last increase in diurertics.    05/12/2021: EGD:  3 bands placed over non bleeding large esophageal varices. Duodenal bx shows no evidence of celiac disease.   Low alpha 1 antitrypsin levels, Elevated Ferritin and iron % saturation 88. INR 1.3 Ascitic fluid  shows SAAG suggestive of portal hypertension. AFP elevated 63.  MRI showed no clear mass but since AFP still elevated will plan further evaluation   05/29/2021 MRI liver with and without contrast shows no evidence of HCC but does another area which shows hyperenhancement and repeat MRI in 3 months has been recommended.  Features of portal hypertension seen.  07/11/2021: Liver biopsy shows features of alpha-1 antitrypsin deficiency, chronic cirrhotic liver with inflammatory activity no dysplastic nodule or carcinoma seen. 08/28/2021 was seen by [facility] for alpha-1 antitrypsin deficiency recommended getting vaccinations for hepatitis A and B.  02/23/2022: EGD: Portal hypertensive gastropathy no esophageal varices repeat EGD in 1 year 02/22/2022: Liver mass previously seen stable recommended repeat MRI in 6 months   03/25/2022 visit with Dr. Manuella Ghazi at [facility].  MELD score of 10 following with specialist for alpha-1 antitrypsin deficiency at pulmonary clinic on Lasix 20 mg and eplerenone 100 mg not required a paracentesis since January 2023 no recent episodes of hepatic encephalopathy   04/15/2022: Colonoscopy: Two  4 to 5 mm polyps in the ascending colon removed with a cold snare.  There were hyperplastic   04/16/2022 presented to the hospital with right upper quadrant pain.  Was diagnosed with hemorrhagic cholecystitis.  Underwent laparoscopic cholecystectomy with Dr. Dahlia Byes.  Developed ascites commenced on 40 mg of Lasix a day.  Path report  showed acute on chronic cholecystitis without cholelithiasis.   05/04/2022: Seen Dr. 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 Very low-sodium diet no NSAID use. Eplerenone 100 mg , lasix 40 mg a day . Last dose of hep B vaccine in 08/2022.  No new changes, sleeping intermittent , some nights are better- going to try some exercises to help .    Current Outpatient Medications  Medication Sig Dispense Refill   folic acid (FOLVITE) 1 MG tablet Take 1 tablet by mouth daily.     thiamine 100 MG tablet Take 1 tablet (100 mg total) by mouth daily.     eplerenone (INSPRA) 50 MG tablet Take 2 tablets (100 mg total) by mouth daily. 60 tablet 2   furosemide (LASIX) 20 MG tablet Take 2 tablets (40 mg total) by mouth daily. 90 tablet 3   Current Facility-Administered Medications  Medication Dose Route Frequency Provider Last Rate Last Admin   albumin human 25 % solution 25 g  25 g Intravenous Once Brian Bellows, MD        Allergies as of 06/22/2022   (No Known Allergies)    Review of Systems:    All systems reviewed and negative except where noted in HPI.  General Appearance:    Alert, cooperative, no distress, appears stated age  Head:    Normocephalic, without obvious abnormality, atraumatic  Eyes:    PERRL, conjunctiva/corneas clear,  Ears:    Grossly normal hearing    Neurologic:  Grossly normal    Observations/Objective:  Labs: CMP     Component Value Date/Time   NA 140 06/02/2022 1410   K 4.3 06/02/2022 1410   CL 104 06/02/2022 1410   CO2  24 06/02/2022 1410   GLUCOSE 93 06/02/2022 1410   GLUCOSE 120 (H) 05/04/2022 0523   BUN 8 06/02/2022 1410   CREATININE 0.71 (L) 06/02/2022 1410   CALCIUM 9.3 06/02/2022 1410   PROT 7.1 06/02/2022 1410   ALBUMIN 3.9 (L) 06/02/2022 1410   AST 69 (H) 06/02/2022 1410   ALT 53 (H) 06/02/2022 1410   ALKPHOS 215 (H) 06/02/2022 1410   BILITOT 1.5 (H) 06/02/2022 1410   GFRNONAA >60 05/04/2022 0523   Lab Results  Component Value Date   WBC 6.5 06/02/2022   HGB 15.6 06/02/2022   HCT 45.6 06/02/2022   MCV 87 06/02/2022   PLT 144 (L) 06/02/2022    Imaging Studies: No results found.  Assessment and Plan:   Brian Vasquez is a 46 y.o. y/o male with a history of alcoholic liver and alpha 1 antitrypsin deficiency related cirrhosis, decompensation with ascites presented to the ER in October 2022 with abdominal distention and acute liver failure, non bleeding esophageal varices that have been  banded.  05/25/2022 MELD score of 10   Concern for masslike lesion on ultrasound but no mass seen on MRI but AFP has been elevated, follow-up with Dr. Manuella Ghazi at [facility] biopsy was benign. Did not tolerate 100 mg of Aldactone and 40 mg of Lasix as he developed gynecomastia, on eplerenone 100 mg and Lasix 40 mg positive tissue transglutaminase antibody but negative biopsies of the duodenum for celiac disease.  Tests for autoimmune liver disease was negative.  Elevated ferritin, very low alpha-1 antitrypsin levels, persistently elevated AFP, no recent episodes of hepatic encephalopathy.  Developed hemorrhagic cholecystitis on 04/16/2022 underwent cholecystectomy  He is on a low-sodium diet.    Plan 1.  No NSAIDs, check CMP in 1 weeks  2.  On eplerenone 100 mg a day and Lasix 40 mg a day  4.  Low-sodium diet very compliant 5.  MRI liver shows stable liver mass repeat in March 2024 orders in place  6.  EGD for surveillance of esophageal varices August 2024 7.  Continue to stay off all alcohol: Has been in remission for  many months and doing very well sarcopenia has improved and he has normal-appearing muscle mass 8.  Complete hepatitis B vaccination series with his primary care physician received 2 or 3 doses 9.  Refills provided of his eplerenone and Lasix advised to continue the same dose as long as his body weight remained stable  F/u in 6-8 weeks as as body weight remains stable.    I discussed the assessment and treatment plan with the patient. The patient was provided an opportunity to ask questions and all were answered. The patient agreed with the plan and demonstrated an understanding of the instructions.   The patient was advised to call back or seek an in-person evaluation if the symptoms worsen or if the condition fails to improve as anticipated.  I provided 15 minutes of face-to-face time during this encounter.  Dr Brian Bellows MD,MRCP [facility]) Gastroenterology/Hepatology Pager: [phone]   Speech recognition software was used to dictate this note.

## 2022-06-22 NOTE — Addendum Note (Signed)
Addended by: Wayna Chalet on: 06/22/2022 01:26 PM   Modules accepted: Orders

## 2022-07-22 LAB — COMPREHENSIVE METABOLIC PANEL
ALT: 44 IU/L (ref 0–44)
AST: 46 IU/L — ABNORMAL HIGH (ref 0–40)
Albumin/Globulin Ratio: 1.2 (ref 1.2–2.2)
Albumin: 3.8 g/dL — ABNORMAL LOW (ref 4.1–5.1)
Alkaline Phosphatase: 159 IU/L — ABNORMAL HIGH (ref 44–121)
BUN/Creatinine Ratio: 14 (ref 9–20)
BUN: 10 mg/dL (ref 6–24)
Bilirubin Total: 1 mg/dL (ref 0.0–1.2)
CO2: 27 mmol/L (ref 20–29)
Calcium: 9.3 mg/dL (ref 8.7–10.2)
Chloride: 101 mmol/L (ref 96–106)
Creatinine, Ser: 0.73 mg/dL — ABNORMAL LOW (ref 0.76–1.27)
Globulin, Total: 3.2 g/dL (ref 1.5–4.5)
Glucose: 102 mg/dL — ABNORMAL HIGH (ref 70–99)
Potassium: 4.6 mmol/L (ref 3.5–5.2)
Sodium: 140 mmol/L (ref 134–144)
Total Protein: 7 g/dL (ref 6.0–8.5)
eGFR: 113 mL/min/{1.73_m2} (ref >59)

## 2022-07-23 NOTE — Progress Notes (Signed)
Labs stable

## 2022-08-03 ENCOUNTER — Telehealth (INDEPENDENT_AMBULATORY_CARE_PROVIDER_SITE_OTHER): Payer: 59 | Admitting: Gastroenterology

## 2022-08-03 DIAGNOSIS — E8801 Alpha-1-antitrypsin deficiency: Secondary | ICD-10-CM | POA: Diagnosis not present

## 2022-08-03 DIAGNOSIS — K7460 Unspecified cirrhosis of liver: Secondary | ICD-10-CM

## 2022-08-03 DIAGNOSIS — R188 Other ascites: Secondary | ICD-10-CM | POA: Diagnosis not present

## 2022-08-03 NOTE — Progress Notes (Signed)
Brian Vasquez , MD [address]  [place]  [place], [place] [address]  Main: [phone]  Fax: [phone]   Primary Care Physician: Wayland Denis, PA-C  Virtual Visit via Video Note  I connected with patient on 08/03/22 at  1:45 PM EST by video and verified that I am speaking with the correct person using two identifiers.   I discussed the limitations, risks, security and privacy concerns of performing an evaluation and management service by video  and the availability of in person appointments. I also discussed with the patient that there may be a patient responsible charge related to this service. The patient expressed understanding and agreed to proceed.  Location of Patient: Home Location of Provider: Home Persons involved: Patient and provider only   History of Present Illness: Chief Complaint  Patient presents with   liver cirrhosis    HPI: Brian Vasquez is a 47 y.o. 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 Manuella Ghazi at [facility].  Biopsy was benign.   I initially met him in the hospital when he was admitted in October 2022 when he presented with ascites.ultrasound which showed cirrhosis with a 4.9 cm masslike lesion in the caudate lobe.  He underwent an MRI of the liver without contrast and with contrast to look at the lesion and showed no concern for malignancy simply noted focal nodular area of the liver.  Plan was to correlate with AFP which was elevated at 79.5.  Ascetic fluid showed no evidence of SBP and likely was due to portal hypertension.  INR was 1.7.  Hemoglobin 13.4.  Hepatitis B core antibody was positive, hepatitis C antibody ,  hepatitis B surface antibody was negative .    Admitted to the hospital for hyponatremia and discharged on 05/12/2021. Likely secondary to combination of over diuresis and salt restriction. He didn't get his labs a few days after his last increase in diurertics.    05/12/2021: EGD:  3 bands placed over non bleeding large esophageal varices. Duodenal bx shows no evidence of celiac disease.   Low alpha 1 antitrypsin levels, Elevated Ferritin and iron % saturation 88. INR 1.3 Ascitic fluid  shows SAAG suggestive of portal hypertension. AFP elevated 63.  MRI showed no clear mass but since AFP still elevated will plan further evaluation   05/29/2021 MRI liver with and without contrast shows no evidence of HCC but does another area which shows hyperenhancement and repeat MRI in 3 months has been recommended.  Features of portal hypertension seen.  07/11/2021: Liver biopsy shows features of alpha-1 antitrypsin deficiency, chronic cirrhotic liver with inflammatory activity no dysplastic nodule or carcinoma seen. 08/28/2021 was seen by [facility] for alpha-1 antitrypsin deficiency recommended getting vaccinations for hepatitis A and B.  02/23/2022: EGD: Portal hypertensive gastropathy no esophageal varices repeat EGD in 1 year 02/22/2022: Liver mass previously seen stable recommended repeat MRI in 6 months   03/25/2022 visit with Dr. Manuella Ghazi at [facility].  MELD score of 10 following with specialist for alpha-1 antitrypsin deficiency at pulmonary clinic on Lasix 20 mg and eplerenone 100 mg not required a paracentesis since January 2023 no recent episodes of hepatic encephalopathy    04/15/2022: Colonoscopy:  Two 4 to 5 mm polyps in the ascending colon removed with a cold snare.  There were hyperplastic   04/16/2022 presented to the hospital with right upper quadrant pain.  Was diagnosed with hemorrhagic cholecystitis.  Underwent laparoscopic cholecystectomy with Dr. Dahlia Byes.  Developed ascites commenced on 40 mg of Lasix a day.   Path report showed acute on chronic cholecystitis without cholelithiasis.   05/04/2022: Seen Dr. 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 Eplerenone 100 mg , lasix 40 mg a day . Last dose of hep B vaccine in 08/2022.  Sleep doing well.  No other complaints  Has a visit with [facility] in march 2024.   Current Outpatient Medications  Medication Sig Dispense Refill   eplerenone (INSPRA) 50 MG tablet Take 2 tablets (100 mg total) by mouth daily. 60 tablet 2   folic acid (FOLVITE) 1 MG tablet Take 1 tablet by mouth daily.     furosemide (LASIX) 20 MG tablet Take 2 tablets (40 mg total) by mouth daily. 90 tablet 3   thiamine 100 MG tablet Take 1 tablet (100 mg total) by mouth daily.     Current Facility-Administered Medications  Medication Dose Route Frequency Provider Last Rate Last Admin   albumin human 25 % solution 25 g  25 g Intravenous Once Brian Bellows, MD        Allergies as of 08/03/2022   (No Known Allergies)    Review of Systems:    All systems reviewed and negative except where noted in HPI.  General Appearance:    Alert, cooperative, no distress, appears stated age  Head:    Normocephalic, without obvious abnormality, atraumatic  Eyes:    PERRL, conjunctiva/corneas clear,  Ears:    Grossly normal hearing    Neurologic:  Grossly normal    Observations/Objective:  Labs: CMP     Component Value Date/Time   NA 140 07/21/2022 1541   K 4.6 07/21/2022 1541   CL  101 07/21/2022 1541   CO2 27 07/21/2022 1541   GLUCOSE 102 (H) 07/21/2022 1541   GLUCOSE 120 (H) 05/04/2022 0523   BUN 10 07/21/2022 1541   CREATININE 0.73 (L) 07/21/2022 1541   CALCIUM 9.3 07/21/2022 1541   PROT 7.0 07/21/2022 1541   ALBUMIN 3.8 (L) 07/21/2022 1541   AST 46 (H) 07/21/2022 1541   ALT 44 07/21/2022 1541   ALKPHOS 159 (H) 07/21/2022 1541   BILITOT 1.0 07/21/2022 1541   GFRNONAA >60 05/04/2022 0523   Lab Results  Component Value Date   WBC 6.5 06/02/2022   HGB 15.6 06/02/2022   HCT 45.6 06/02/2022   MCV 87 06/02/2022   PLT 144 (L) 06/02/2022    Imaging Studies: No results found.  Assessment and Plan:   Brian Vasquez is a 47 y.o. y/o male with a history of alcoholic liver and alpha 1 antitrypsin deficiency related cirrhosis, decompensation with ascites presented to the ER in October 2022 with abdominal distention and acute liver failure,  non bleeding esophageal varices that have been banded.  05/25/2022 MELD score of 10   Concern for masslike lesion on ultrasound but no mass seen on MRI but AFP has been elevated, follow-up with Dr. Manuella Ghazi at [facility] biopsy was benign. Did not tolerate 100 mg of Aldactone and 40 mg of Lasix as he developed gynecomastia, on eplerenone 100 mg and Lasix 40 mg positive tissue transglutaminase antibody but negative biopsies of the duodenum for celiac disease.  Tests for autoimmune liver disease was negative.  Elevated ferritin, very low alpha-1 antitrypsin levels, persistently elevated AFP, no recent episodes of hepatic encephalopathy.  Developed hemorrhagic cholecystitis on 04/16/2022 underwent cholecystectomy  He is on a low-sodium diet.    Plan 1.  No NSAIDs, Labs CMP,INR in 4 -6 weeks 2.  On eplerenone 100 mg a day and Lasix 40 mg a day  4.  Low-sodium diet very compliant 5.  MRI liver shows stable liver mass repeat in March 2024 orders in place  6.  EGD for surveillance of esophageal varices August 2024 7.  Continue to stay off all  alcohol: Has been in remission for many months and doing very well sarcopenia has improved and he has normal-appearing muscle mass 8.  Complete hepatitis B vaccination series with his primary care physician received 2 or 3 doses  9. F/u in 8-12 weeks     I discussed the assessment and treatment plan with the patient. The patient was provided an opportunity to ask questions and all were answered. The patient agreed with the plan and demonstrated an understanding of the instructions.   The patient was advised to call back or seek an in-person evaluation if the symptoms worsen or if the condition fails to improve as anticipated.  I provided 10 minutes of face-to-face time during this encounter.  Dr Brian Bellows MD,MRCP [facility]) Gastroenterology/Hepatology Pager: [phone]   Speech recognition software was used to dictate this note.

## 2022-08-03 NOTE — Patient Instructions (Signed)
Please come to our office in 4 weeks to have your labs drawn.

## 2022-09-08 ENCOUNTER — Encounter: Payer: Self-pay | Admitting: Gastroenterology

## 2022-09-09 ENCOUNTER — Other Ambulatory Visit: Payer: Self-pay | Admitting: Gastroenterology

## 2022-09-09 DIAGNOSIS — K7031 Alcoholic cirrhosis of liver with ascites: Secondary | ICD-10-CM

## 2022-09-17 ENCOUNTER — Other Ambulatory Visit: Payer: Self-pay | Admitting: Gastroenterology

## 2022-09-21 ENCOUNTER — Other Ambulatory Visit: Payer: 59

## 2022-09-29 ENCOUNTER — Ambulatory Visit
Admission: RE | Admit: 2022-09-29 | Discharge: 2022-09-29 | Disposition: A | Discharge status: Home / Self Care | Payer: 59 | Source: Ambulatory Visit | Attending: Gastroenterology | Admitting: Gastroenterology

## 2022-09-29 DIAGNOSIS — K7031 Alcoholic cirrhosis of liver with ascites: Secondary | ICD-10-CM

## 2022-09-29 MED ORDER — GADOPICLENOL 0.5 MMOL/ML IV SOLN
9.0000 mL | Freq: Once | INTRAVENOUS | Status: AC | PRN
  Administered 2022-09-29: 9 mL via INTRAVENOUS

## 2022-10-06 NOTE — Progress Notes (Signed)
Inform everything looks stable if anything a bit better repeat MRI of the liver in 4 months

## 2022-10-07 DIAGNOSIS — K7460 Unspecified cirrhosis of liver: Secondary | ICD-10-CM

## 2022-10-07 DIAGNOSIS — R188 Other ascites: Secondary | ICD-10-CM

## 2022-10-07 DIAGNOSIS — K769 Liver disease, unspecified: Secondary | ICD-10-CM

## 2022-10-08 NOTE — Addendum Note (Signed)
Addended by: Wayna Chalet on: 10/08/2022 10:27 AM   Modules accepted: Orders

## 2022-10-09 ENCOUNTER — Other Ambulatory Visit: Payer: Self-pay

## 2022-10-09 DIAGNOSIS — R772 Abnormality of alphafetoprotein: Secondary | ICD-10-CM

## 2022-10-09 DIAGNOSIS — R188 Other ascites: Secondary | ICD-10-CM

## 2022-10-09 DIAGNOSIS — I8500 Esophageal varices without bleeding: Secondary | ICD-10-CM

## 2022-10-09 NOTE — Telephone Encounter (Signed)
CBC,CMP,INR,AFP

## 2022-11-02 ENCOUNTER — Telehealth (INDEPENDENT_AMBULATORY_CARE_PROVIDER_SITE_OTHER): Payer: 59 | Admitting: Gastroenterology

## 2022-11-02 DIAGNOSIS — K7460 Unspecified cirrhosis of liver: Secondary | ICD-10-CM | POA: Diagnosis not present

## 2022-11-02 DIAGNOSIS — R772 Abnormality of alphafetoprotein: Secondary | ICD-10-CM

## 2022-11-02 DIAGNOSIS — R188 Other ascites: Secondary | ICD-10-CM

## 2022-11-02 DIAGNOSIS — K769 Liver disease, unspecified: Secondary | ICD-10-CM

## 2022-11-02 NOTE — Progress Notes (Signed)
Wyline Mood , MD [address]  [address]  [place], [place] [address]  Main: [phone]  Fax: [phone]   Primary Care Physician: Carren Rang, PA-C  Virtual Visit via Video Note  I connected with patient on 11/02/22 at  1:45 PM EDT by video and verified that I am speaking with the correct person using two identifiers.   I discussed the limitations, risks, security and privacy concerns of performing an evaluation and management service by video  and the availability of in person appointments. I also discussed with the patient that there may be a patient responsible charge related to this service. The patient expressed understanding and agreed to proceed.  Location of Patient: Home Location of Provider: Home Persons involved: Patient and provider only   History of Present Illness: Chief Complaint  Patient presents with   cirrhosis of the liver    HPI: Brian Vasquez is a 47 y.o. 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 Sherryll Burger at [facility].  Biopsy was benign.   I initially met him in the hospital when he was admitted in October 2022 when he presented with ascites.ultrasound which showed cirrhosis with a 4.9 cm masslike lesion in the caudate lobe.  He underwent an MRI of the liver without contrast and with contrast to look at the lesion and showed no concern for malignancy simply noted focal nodular area of the liver.  AFP which was elevated at 79.5.  Ascetic fluid likely was due to portal hypertension.  INR was 1.7.  Hemoglobin 13.4.  Hepatitis B core antibody was positive, hepatitis C antibody , hepatitis B surface antibody was negative .    Admitted  to the hospital for hyponatremia and discharged on 05/12/2021. secondary to combination of over diuresis and salt restriction.    05/12/2021: EGD:  3 bands placed over non bleeding large esophageal varices. Duodenal bx shows no evidence of celiac disease.  07/11/2021: Liver biopsy shows features of alpha-1 antitrypsin deficiency, chronic cirrhotic liver with inflammatory activity no dysplastic nodule or carcinoma seen. 08/28/2021 was seen by [facility] for alpha-1 antitrypsin deficiency 02/23/2022: EGD: Portal hypertensive gastropathy no esophageal varices repeat EGD in 1 year 02/22/2022: Liver mass previously seen stable recommended repeat MRI in 6 months 04/15/2022: Colonoscopy: Two 4 to 5 mm polyps in the ascending colon removed with a cold snare.  There were hyperplastic  10/12/2023diagnosed with hemorrhagic cholecystitis s/p laparoscopic cholecystectomy with Dr. Everlene Farrier.  Developed ascites commenced on 40 mg of Lasix a day.  Path report showed acute on chronic cholecystitis without cholelithiasis.   05/04/2022: Seen Dr. 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 Pervis Hocking at [facility]: Lasix drop to 20 mg a day and eplerenone to 50 mg a day.  Lactulose and  Xifaxan has been held as he has not had any episodes of encephalopathy.  MELD was 10 previously .  Gained some weigh , does not think its fluid.  Very low-sodium diet no NSAID use. Last dose of hep B vaccine in a few weeks.  Sleep doing well.  No other complaints, no NSAID use.  Current Outpatient Medications   Medication Sig Dispense Refill   eplerenone (INSPRA) 50 MG tablet TAKE 2 TABLETS BY MOUTH EVERY DAY 180 tablet 1   folic acid (FOLVITE) 1 MG tablet Take 1 tablet by mouth daily.     furosemide (LASIX) 20 MG tablet Take 2 tablets (40 mg total) by mouth daily. 90 tablet 3   thiamine 100 MG tablet Take 1 tablet (100 mg total) by mouth daily.     Current Facility-Administered Medications  Medication Dose Route Frequency Provider Last Rate Last Admin   albumin human 25 % solution 25 g  25 g Intravenous Once Wyline Mood, MD        Allergies as of 11/02/2022   (No Known Allergies)    Review of Systems:    All systems reviewed and negative except where noted in HPI.  General Appearance:    Alert, cooperative, no distress, appears stated age  Head:    Normocephalic, without obvious abnormality, atraumatic  Eyes:    PERRL, conjunctiva/corneas clear,  Ears:    Grossly normal hearing    Neurologic:  Grossly normal    Observations/Objective:  Labs: CMP     Component Value Date/Time   NA 140 07/21/2022 1541   K 4.6 07/21/2022 1541   CL 101 07/21/2022 1541   CO2 27 07/21/2022 1541   GLUCOSE 102 (H) 07/21/2022 1541   GLUCOSE 120 (H) 05/04/2022 0523   BUN 10 07/21/2022 1541   CREATININE 0.73 (L) 07/21/2022 1541   CALCIUM 9.3 07/21/2022 1541   PROT 7.0 07/21/2022 1541   ALBUMIN 3.8 (L) 07/21/2022 1541   AST 46 (H) 07/21/2022 1541   ALT 44 07/21/2022 1541   ALKPHOS 159 (H) 07/21/2022 1541   BILITOT 1.0 07/21/2022 1541   GFRNONAA >60 05/04/2022 0523   Lab Results  Component Value Date   WBC 6.5 06/02/2022   HGB 15.6 06/02/2022   HCT 45.6 06/02/2022   MCV 87 06/02/2022   PLT 144 (L) 06/02/2022    Imaging Studies: No results found.  Assessment and Plan:   Brian Vasquez is a 47 y.o. y/o male  with a history of alcoholic liver and alpha 1 antitrypsin deficiency related cirrhosis, decompensation with ascites presented to the ER in October 2022 with abdominal distention and  acute liver failure, non bleeding esophageal varices that have been banded.  05/25/2022 MELD score of 10   Concern for masslike lesion on ultrasound but no mass seen on MRI but AFP has been elevated, follow-up with Dr. Sherryll Burger at [facility] biopsy was benign. Did not tolerate Aldactone as he developed gynecomastia,positive tissue transglutaminase antibody but negative biopsies of the duodenum for celiac disease.  Tests for autoimmune liver disease was negative.  Elevated ferritin, very low alpha-1 antitrypsin levels, persistently elevated AFP, no recent episodes of hepatic encephalopathy.      Plan 1.  No NSAIDs, Labs check labs to calculate MELD score 2.  On eplerenone 50 mg and Lasix 20 mg a day 4.  Low-sodium diet very compliant 5.  MRI liver shows stable liver mass repeat in August  2024 6.  EGD for surveillance of esophageal varices August 2024 which has  been scheduled 7.  Continue to stay off all alcohol: Has been in remission for many months and doing very well sarcopenia has improved and he has normal-appearing muscle mass 8.  Complete hepatitis B vaccination series with his primary care physician received 2 or 3 doses  F/u in 3-4 months       I discussed the assessment and treatment plan with the patient. The patient was provided an opportunity to ask questions and all were answered. The patient agreed with the plan and demonstrated an understanding of the instructions.   The patient was advised to call back or seek an in-person evaluation if the symptoms worsen or if the condition fails to improve as anticipated.  I provided 18 minutes of face-to-face time during this encounter.  Dr Wyline Mood MD,MRCP [facility]) Gastroenterology/Hepatology Pager: [phone]   Speech recognition software was used to dictate this note.

## 2022-11-21 LAB — COMPREHENSIVE METABOLIC PANEL
ALT: 47 IU/L — ABNORMAL HIGH (ref 0–44)
AST: 50 IU/L — ABNORMAL HIGH (ref 0–40)
Albumin/Globulin Ratio: 1.4 (ref 1.2–2.2)
Albumin: 3.9 g/dL — ABNORMAL LOW (ref 4.1–5.1)
Alkaline Phosphatase: 129 IU/L — ABNORMAL HIGH (ref 44–121)
BUN/Creatinine Ratio: 14 (ref 9–20)
BUN: 10 mg/dL (ref 6–24)
Bilirubin Total: 0.8 mg/dL (ref 0.0–1.2)
CO2: 22 mmol/L (ref 20–29)
Calcium: 9.3 mg/dL (ref 8.7–10.2)
Chloride: 103 mmol/L (ref 96–106)
Creatinine, Ser: 0.74 mg/dL — ABNORMAL LOW (ref 0.76–1.27)
Globulin, Total: 2.8 g/dL (ref 1.5–4.5)
Glucose: 99 mg/dL (ref 70–99)
Potassium: 4.4 mmol/L (ref 3.5–5.2)
Sodium: 138 mmol/L (ref 134–144)
Total Protein: 6.7 g/dL (ref 6.0–8.5)
eGFR: 112 mL/min/{1.73_m2} (ref >59)

## 2022-11-21 LAB — CBC WITH DIFFERENTIAL/PLATELET
Basophils Absolute: 0 10*3/uL (ref 0.0–0.2)
Basos: 1 %
EOS (ABSOLUTE): 0.1 10*3/uL (ref 0.0–0.4)
Eos: 1 %
Hematocrit: 44.9 % (ref 37.5–51.0)
Hemoglobin: 15.5 g/dL (ref 13.0–17.7)
Immature Grans (Abs): 0 10*3/uL (ref 0.0–0.1)
Immature Granulocytes: 0 %
Lymphocytes Absolute: 1.6 10*3/uL (ref 0.7–3.1)
Lymphs: 27 %
MCH: 30.2 pg (ref 26.6–33.0)
MCHC: 34.5 g/dL (ref 31.5–35.7)
MCV: 87 fL (ref 79–97)
Monocytes Absolute: 0.5 10*3/uL (ref 0.1–0.9)
Monocytes: 8 %
Neutrophils Absolute: 3.7 10*3/uL (ref 1.4–7.0)
Neutrophils: 63 %
Platelets: 106 10*3/uL — ABNORMAL LOW (ref 150–450)
RBC: 5.14 x10E6/uL (ref 4.14–5.80)
RDW: 14.5 % (ref 11.6–15.4)
WBC: 5.9 10*3/uL (ref 3.4–10.8)

## 2022-11-21 LAB — PROTIME-INR
INR: 1.2 (ref 0.9–1.2)
Prothrombin Time: 12.6 s — ABNORMAL HIGH (ref 9.1–12.0)

## 2022-11-21 LAB — AFP TUMOR MARKER: AFP, Serum, Tumor Marker: 79.1 ng/mL — ABNORMAL HIGH (ref 0.0–6.9)

## 2022-12-22 ENCOUNTER — Encounter: Payer: Self-pay | Admitting: Gastroenterology

## 2022-12-23 ENCOUNTER — Other Ambulatory Visit: Payer: Self-pay

## 2022-12-23 DIAGNOSIS — R772 Abnormality of alphafetoprotein: Secondary | ICD-10-CM

## 2022-12-23 MED ORDER — FUROSEMIDE 20 MG PO TABS: 40.0000 mg | ORAL_TABLET | Freq: Every day | ORAL | 3 refills | Status: AC

## 2023-01-07 LAB — COMPREHENSIVE METABOLIC PANEL
ALT: 51 IU/L — ABNORMAL HIGH (ref 0–44)
AST: 48 IU/L — ABNORMAL HIGH (ref 0–40)
Albumin: 4.2 g/dL (ref 4.1–5.1)
Alkaline Phosphatase: 131 IU/L — ABNORMAL HIGH (ref 44–121)
BUN/Creatinine Ratio: 14 (ref 9–20)
BUN: 11 mg/dL (ref 6–24)
Bilirubin Total: 1.2 mg/dL (ref 0.0–1.2)
CO2: 23 mmol/L (ref 20–29)
Calcium: 9 mg/dL (ref 8.7–10.2)
Chloride: 105 mmol/L (ref 96–106)
Creatinine, Ser: 0.78 mg/dL (ref 0.76–1.27)
Globulin, Total: 2.6 g/dL (ref 1.5–4.5)
Glucose: 126 mg/dL — ABNORMAL HIGH (ref 70–99)
Potassium: 3.9 mmol/L (ref 3.5–5.2)
Sodium: 143 mmol/L (ref 134–144)
Total Protein: 6.8 g/dL (ref 6.0–8.5)
eGFR: 111 mL/min/{1.73_m2} (ref >59)

## 2023-01-10 NOTE — Progress Notes (Signed)
Stable

## 2023-01-21 ENCOUNTER — Other Ambulatory Visit: Payer: Self-pay | Admitting: Gastroenterology

## 2023-01-21 DIAGNOSIS — K7460 Unspecified cirrhosis of liver: Secondary | ICD-10-CM

## 2023-01-21 DIAGNOSIS — K769 Liver disease, unspecified: Secondary | ICD-10-CM

## 2023-02-01 ENCOUNTER — Telehealth (INDEPENDENT_AMBULATORY_CARE_PROVIDER_SITE_OTHER): Payer: 59 | Admitting: Gastroenterology

## 2023-02-01 DIAGNOSIS — K7460 Unspecified cirrhosis of liver: Secondary | ICD-10-CM | POA: Diagnosis not present

## 2023-02-01 DIAGNOSIS — R772 Abnormality of alphafetoprotein: Secondary | ICD-10-CM

## 2023-02-01 DIAGNOSIS — R188 Other ascites: Secondary | ICD-10-CM

## 2023-02-01 DIAGNOSIS — E8801 Alpha-1-antitrypsin deficiency: Secondary | ICD-10-CM

## 2023-02-01 DIAGNOSIS — Z23 Encounter for immunization: Secondary | ICD-10-CM

## 2023-02-01 NOTE — Progress Notes (Signed)
Brian Vasquez , MD [address]  [address]  [place], [place] [address]  Main: [phone]  Fax: [phone]   Primary Care Physician: Carren Rang, PA-C  Virtual Visit via Video Note  I connected with patient on 02/01/23 at  2:30 PM EDT by video and verified that I am speaking with the correct person using two identifiers.   I discussed the limitations, risks, security and privacy concerns of performing an evaluation and management service by video  and the availability of in person appointments. I also discussed with the patient that there may be a patient responsible charge related to this service. The patient expressed understanding and agreed to proceed.  Location of Patient: Home Location of Provider: Home Persons involved: Patient and provider only   History of Present Illness: Chief Complaint  Patient presents with   Cirrhosis    HPI: Brian Vasquez is a 47 y.o. male   Summary of history :   He has been followed for alcoholic liver disease as well as severe deficiency of alpha 1 antitrypsin as a combination causing cirrhosis, decompensated ascites in 2022, nonbleeding esophageal varices that have been banded since November 2022, concern for masslike lesion on ultrasound but no mass seen on MRI AFP elevated follow-up with Dr. Sherryll Burger at [facility].  Biopsy was benign.   I initially met him in the hospital when he was admitted in October 2022 when he presented with ascites.ultrasound which showed cirrhosis with a 4.9 cm masslike lesion in the caudate lobe.  He underwent an MRI of the liver without contrast and with contrast to look at the lesion and showed no concern for malignancy simply noted focal nodular area of the liver.  AFP which was elevated at 79.5.  Ascetic fluid likely was due to portal hypertension.  INR was 1.7.  Hemoglobin 13.4.  Hepatitis B core antibody was positive, hepatitis C antibody , hepatitis B surface antibody was negative .    Admitted to the  hospital for hyponatremia and discharged on 05/12/2021. secondary to combination of over diuresis and salt restriction.    05/12/2021: EGD:  3 bands placed over non bleeding large esophageal varices. Duodenal bx shows no evidence of celiac disease.   07/11/2021: Liver biopsy shows features of alpha-1 antitrypsin deficiency, chronic cirrhotic liver with inflammatory activity no dysplastic nodule or carcinoma seen. 08/28/2021 was seen by [facility] for alpha-1 antitrypsin deficiency 02/23/2022: EGD: Portal hypertensive gastropathy no esophageal varices repeat EGD in 1 year 02/22/2022: Liver mass previously seen stable recommended repeat MRI in 6 months 04/15/2022: Colonoscopy: Two 4 to 5 mm polyps in the ascending colon removed with a cold snare.  There were hyperplastic  10/12/2023diagnosed with hemorrhagic cholecystitis s/p laparoscopic cholecystectomy with Dr. Everlene Farrier.  Developed ascites commenced on 40 mg of Lasix a day.  Path report showed acute on chronic cholecystitis without cholelithiasis.   05/04/2022: Seen Dr. Sherryll Burger at [facility]: Discussed about living donor transplantation meld of 22 on 05/04/2022 following with alpha-1 antitrypsin deficiency clinic at [facility] on Lasix 40 mg and eplerenone 100 mg suggested to decrease dose 06/02/2022 hepatitis B surface antibody non reactive   06/02/2022: MELD score of 10 09/30/2022: MRI of the liver with and without contrast shows slight decrease in size of liver lesions plan to repeat MRI in July 2024 09/24/2022 visit with Dr. Pervis Hocking at [facility] Lasix drop to 20 mg a day and eplerenone to 50 mg a day.      Interval history 11/02/2022-02/01/2023   01/06/2023: CMP:  Cr 0.78  MELD score of 8 in May 2024 Doing well, weight stable. Gained some weight . Doesn't think its fluid. Sleeps well . Has had some dental work with no issues, low salt ,  Taking lactulose as needed, On eplerenone 50 mg a day , lasix 20 mg .  MRI scheduled for August 15th , EGD in 26th August . Sleeping well at  night . Not on any xifaxan.   Current Outpatient Medications  Medication Sig Dispense Refill   eplerenone (INSPRA) 50 MG tablet TAKE 2 TABLETS BY MOUTH EVERY DAY (Patient taking differently: Take 50 mg by mouth daily.) 180 tablet 1   folic acid (FOLVITE) 1 MG tablet Take 1 tablet by mouth daily.     furosemide (LASIX) 20 MG tablet Take 2 tablets (40 mg total) by mouth daily. (Patient taking differently: Take 20 mg by mouth daily.) 90 tablet 3   thiamine 100 MG tablet Take 1 tablet (100 mg total) by mouth daily.     Current Facility-Administered Medications  Medication Dose Route Frequency Provider Last Rate Last Admin   albumin human 25 % solution 25 g  25 g Intravenous Once Brian Mood, MD        Allergies as of 02/01/2023   (No Known Allergies)    Review of Systems:    All systems reviewed and negative except where noted in HPI.  General Appearance:    Alert, cooperative, no distress, appears stated age  Head:    Normocephalic, without obvious abnormality, atraumatic  Eyes:    PERRL, conjunctiva/corneas clear,  Ears:    Grossly normal hearing    Neurologic:  Grossly normal    Observations/Objective:  Labs: CMP     Component Value Date/Time   NA 143 01/06/2023 1341   K 3.9 01/06/2023 1341   CL 105 01/06/2023 1341   CO2 23 01/06/2023 1341   GLUCOSE 126 (H) 01/06/2023 1341   GLUCOSE 120 (H) 05/04/2022 0523   BUN 11 01/06/2023 1341   CREATININE 0.78 01/06/2023 1341   CALCIUM 9.0 01/06/2023 1341   PROT 6.8 01/06/2023 1341   ALBUMIN 4.2 01/06/2023 1341   AST 48 (H) 01/06/2023 1341   ALT 51 (H) 01/06/2023 1341   ALKPHOS 131 (H) 01/06/2023 1341   BILITOT 1.2 01/06/2023 1341   GFRNONAA >60 05/04/2022 0523   Lab Results  Component Value Date   WBC 5.9 11/20/2022   HGB 15.5 11/20/2022   HCT 44.9 11/20/2022   MCV 87 11/20/2022   PLT 106 (L) 11/20/2022    Imaging Studies: No results found.  Assessment and Plan:   [name] is a 47 y.o. y/o male with a  history of alcoholic liver and alpha 1 antitrypsin deficiency related cirrhosis, decompensation with ascites presented to the ER in October 2022 with abdominal distention and acute liver failure, non bleeding esophageal varices that have been banded.  05/25/2022 MELD score of 10   Concern for masslike lesion on ultrasound but no mass seen on MRI but AFP has been elevated, follow-up with Dr. Sherryll Burger at [facility] biopsy was benign. Did not tolerate Aldactone as he developed gynecomastia,positive tissue transglutaminase antibody but negative biopsies of the duodenum for celiac disease.  Tests for autoimmune liver disease was negative.  Elevated ferritin, very low alpha-1 antitrypsin levels, persistently elevated AFP, no recent episodes of hepatic encephalopathy.      Plan 1.  No NSAIDs, check labs in 04/2023  2.  On eplerenone 50 mg and Lasix 20 mg a day  continue  4.  Low-sodium diet very compliant 5.  Elevated AFP: MRI liver shows stable liver mass repeat in August  2024 6.  EGD for surveillance of esophageal varices August 2024 which has been scheduled 7.  Continue to stay off all alcohol: Has been in remission for many months and doing very well sarcopenia has improved and he has normal-appearing muscle mass 8.  Complete Hep B vaccine. Due for Tdap . Has had pneumonia and covid vaccine , flu shot.    F/u in 4 months      I discussed the assessment and treatment plan with the patient. The patient was provided an opportunity to ask questions and all were answered. The patient agreed with the plan and demonstrated an understanding of the instructions.   The patient was advised to call back or seek an in-person evaluation if the symptoms worsen or if the condition fails to improve as anticipated.  I provided 15  minutes of face-to-face time during this encounter.  Dr Brian Mood MD,MRCP [facility]) Gastroenterology/Hepatology Pager: [phone]   Speech recognition software was used to dictate this note.

## 2023-02-08 ENCOUNTER — Ambulatory Visit: Payer: 59

## 2023-02-15 ENCOUNTER — Encounter: Payer: Self-pay | Admitting: Gastroenterology

## 2023-02-18 ENCOUNTER — Ambulatory Visit
Admission: RE | Admit: 2023-02-18 | Discharge: 2023-02-18 | Disposition: A | Discharge status: Home / Self Care | Payer: 59 | Source: Ambulatory Visit | Attending: Gastroenterology | Admitting: Gastroenterology

## 2023-02-18 DIAGNOSIS — K7460 Unspecified cirrhosis of liver: Secondary | ICD-10-CM

## 2023-02-18 DIAGNOSIS — K769 Liver disease, unspecified: Secondary | ICD-10-CM

## 2023-02-18 MED ORDER — GADOPICLENOL 0.5 MMOL/ML IV SOLN
10.0000 mL | Freq: Once | INTRAVENOUS | Status: AC | PRN
  Administered 2023-02-18: 8 mL via INTRAVENOUS

## 2023-02-20 NOTE — Progress Notes (Signed)
Lesions are stable - repeat MRI in 6 months . Place recall

## 2023-02-26 ENCOUNTER — Encounter: Payer: Self-pay | Admitting: Gastroenterology

## 2023-03-01 ENCOUNTER — Ambulatory Visit: Payer: 59 | Admitting: Anesthesiology

## 2023-03-01 ENCOUNTER — Ambulatory Visit
Admission: RE | Admit: 2023-03-01 | Discharge: 2023-03-01 | Disposition: A | Discharge status: Home / Self Care | Payer: 59 | Attending: Gastroenterology | Admitting: Gastroenterology

## 2023-03-01 ENCOUNTER — Encounter
Admission: RE | Disposition: A | Discharge status: Home / Self Care | Payer: Self-pay | Source: Home / Self Care | Attending: Gastroenterology

## 2023-03-01 DIAGNOSIS — Z87891 Personal history of nicotine dependence: Secondary | ICD-10-CM | POA: Diagnosis not present

## 2023-03-01 DIAGNOSIS — K7460 Unspecified cirrhosis of liver: Secondary | ICD-10-CM | POA: Diagnosis present

## 2023-03-01 DIAGNOSIS — I8500 Esophageal varices without bleeding: Secondary | ICD-10-CM

## 2023-03-01 DIAGNOSIS — Z13810 Encounter for screening for upper gastrointestinal disorder: Secondary | ICD-10-CM | POA: Diagnosis not present

## 2023-03-01 DIAGNOSIS — I10 Essential (primary) hypertension: Secondary | ICD-10-CM | POA: Insufficient documentation

## 2023-03-01 HISTORY — PX: ESOPHAGOGASTRODUODENOSCOPY (EGD) WITH PROPOFOL: SHX5813

## 2023-03-01 SURGERY — ESOPHAGOGASTRODUODENOSCOPY (EGD) WITH PROPOFOL
Anesthesia: General

## 2023-03-01 MED ORDER — PROPOFOL 500 MG/50ML IV EMUL
INTRAVENOUS | Status: DC | PRN
  Administered 2023-03-01: 150 ug/kg/min via INTRAVENOUS

## 2023-03-01 MED ORDER — SODIUM CHLORIDE 0.9 % IV SOLN: INTRAVENOUS | Status: DC

## 2023-03-01 MED ORDER — PROPOFOL 10 MG/ML IV BOLUS
INTRAVENOUS | Status: DC | PRN
  Administered 2023-03-01: 20 mg via INTRAVENOUS
  Administered 2023-03-01: 100 mg via INTRAVENOUS

## 2023-03-01 NOTE — H&P (Signed)
Wyline Mood, MD [address], [address], [place], [place], [address] [address], [place], [place], [address] Phone: [phone]  Fax: [phone]  Primary Care Physician:  Carren Rang, PA-C   Pre-Procedure History & Physical: HPI:  Brian Vasquez is a 47 y.o. male is here for an endoscopy    Past Medical History:  Diagnosis Date   Alcohol abuse 04/17/2021   Alcoholic cirrhosis of liver with ascites (HCC)    Alpha-1-antitrypsin deficiency (HCC)    Cirrhosis (HCC)    Esophageal varices (HCC)    ETOH abuse    Hypertension    Hyponatremia 05/10/2021    Past Surgical History:  Procedure Laterality Date   CHOLECYSTECTOMY  04/16/2022   COLONOSCOPY WITH PROPOFOL N/A 04/15/2022   Procedure: COLONOSCOPY WITH PROPOFOL;  Surgeon: Wyline Mood, MD;  Location: [facility] ENDOSCOPY;  Service: Gastroenterology;  Laterality: N/A;   ESOPHAGOGASTRODUODENOSCOPY (EGD) WITH PROPOFOL N/A 05/12/2021   Procedure: ESOPHAGOGASTRODUODENOSCOPY (EGD) WITH PROPOFOL;  Surgeon: Wyline Mood, MD;  Location: [facility] ENDOSCOPY;  Service: Gastroenterology;  Laterality: N/A;   ESOPHAGOGASTRODUODENOSCOPY (EGD) WITH PROPOFOL N/A 07/08/2021   Procedure: ESOPHAGOGASTRODUODENOSCOPY (EGD) WITH PROPOFOL;  Surgeon: Wyline Mood, MD;  Location: [facility] ENDOSCOPY;  Service: Gastroenterology;  Laterality: N/A;   ESOPHAGOGASTRODUODENOSCOPY (EGD) WITH PROPOFOL N/A 08/20/2021   Procedure: ESOPHAGOGASTRODUODENOSCOPY (EGD) WITH PROPOFOL;  Surgeon: Wyline Mood, MD;  Location: [facility] ENDOSCOPY;  Service: Gastroenterology;  Laterality: N/A;   ESOPHAGOGASTRODUODENOSCOPY (EGD) WITH PROPOFOL N/A 02/23/2022   Procedure: ESOPHAGOGASTRODUODENOSCOPY (EGD) WITH PROPOFOL;  Surgeon: Wyline Mood, MD;  Location: [facility] ENDOSCOPY;  Service: Gastroenterology;  Laterality: N/A;    Prior to Admission medications   Medication Sig Start Date End Date Taking? Authorizing Provider  eplerenone (INSPRA) 50 MG tablet TAKE 2 TABLETS BY MOUTH EVERY  DAY Patient taking differently: Take 50 mg by mouth daily. 09/17/22   Wyline Mood, MD  folic acid (FOLVITE) 1 MG tablet Take 1 tablet by mouth daily.    [provider]  furosemide (LASIX) 20 MG tablet Take 2 tablets (40 mg total) by mouth daily. Patient taking differently: Take 20 mg by mouth daily. 12/23/22   Wyline Mood, MD  thiamine 100 MG tablet Take 1 tablet (100 mg total) by mouth daily. 04/16/21   Lonia Blood, MD    Allergies as of 10/09/2022   (No Known Allergies)    History reviewed. No pertinent family history.  Social History   Socioeconomic History   Marital status: Divorced    Spouse name: Not on file   Number of children: Not on file   Years of education: Not on file   Highest education level: Not on file  Occupational History   Not on file  Tobacco Use   Smoking status: Former    Types: Cigarettes    Passive exposure: Past   Smokeless tobacco: Never  Vaping Use   Vaping status: Never Used  Substance and Sexual Activity   Alcohol use: Not Currently   Drug use: Not Currently   Sexual activity: Not on file  Other Topics Concern   Not on file  Social History Narrative   Not on file   Social Determinants of Health   Financial Resource Strain: Not on file  Food Insecurity: No Food Insecurity (04/17/2022)   Hunger Vital Sign    Worried About Running Out of Food in the Last Year: Never true    Ran Out of Food in the Last Year: Never true  Transportation Needs: No Transportation Needs (04/17/2022)  PRAPARE - Administrator, Civil Service (Medical): No    Lack of Transportation (Non-Medical): No  Physical Activity: Not on file  Stress: Not on file  Social Connections: Not on file  Intimate Partner Violence: Not At Risk (04/17/2022)   Humiliation, Afraid, Rape, and Kick questionnaire    Fear of Current or Ex-Partner: No    Emotionally Abused: No    Physically Abused: No    Sexually Abused: No    Review of Systems: See HPI,  otherwise negative ROS  Physical Exam: There were no vitals taken for this visit. General:   Alert,  pleasant and cooperative in NAD Head:  Normocephalic and atraumatic. Neck:  Supple; no masses or thyromegaly. Lungs:  Clear throughout to auscultation, normal respiratory effort.    Heart:  +S1, +S2, Regular rate and rhythm, No edema. Abdomen:  Soft, nontender and nondistended. Normal bowel sounds, without guarding, and without rebound.   Neurologic:  Alert and  oriented x4;  grossly normal neurologically.  Impression/Plan: Brian Vasquez is here for an endoscopy  to be performed for  evaluation of esophageal varices    Risks, benefits, limitations, and alternatives regarding endoscopy have been reviewed with the patient.  Questions have been answered.  All parties agreeable.   Wyline Mood, MD  03/01/2023, 8:32 AM

## 2023-03-01 NOTE — Op Note (Signed)
[facility] Gastroenterology Patient Name: Brian Vasquez Procedure Date: 03/01/2023 9:26 AM MRN: [id number] Account #: [id number] Date of Birth: 02-16-1976 Admit Type: Outpatient Age: 47 Room: [facility] ENDO ROOM 2 Gender: Male Note Status: Finalized Instrument Name: Upper Endoscope [id number] Procedure:             Upper GI endoscopy Indications:           Cirrhosis rule out esophageal varices Providers:             Wyline Mood MD, MD Referring MD:          No Local Md, MD (Referring MD) Medicines:             Monitored Anesthesia Care Complications:         No immediate complications. Procedure:             Pre-Anesthesia Assessment:                        - Prior to the procedure, a History and Physical was                         performed, and patient medications, allergies and                         sensitivities were reviewed. The patient's tolerance                         of previous anesthesia was reviewed.                        - The risks and benefits of the procedure and the                         sedation options and risks were discussed with the                         patient. All questions were answered and informed                         consent was obtained.                        - ASA Grade Assessment: III - A patient with severe                         systemic disease.                        After obtaining informed consent, the endoscope was                         passed under direct vision. Throughout the procedure,                         the patient's blood pressure, pulse, and oxygen                         saturations were monitored continuously. The Endoscope  was introduced through the mouth, and advanced to the                         third part of duodenum. The upper GI endoscopy was                         accomplished with ease. The patient tolerated the                         procedure well. Findings:       The esophagus was normal.      The stomach was normal.      The examined duodenum was normal. Impression:            - Normal esophagus.                        - Normal stomach.                        - Normal examined duodenum.                        - No specimens collected. Recommendation:        - Discharge patient to home (with escort).                        - Resume previous diet.                        - Continue present medications.                        - Repeat upper endoscopy in 1 year for surveillance. Procedure Code(s):     --- Professional ---                        [phone], Esophagogastroduodenoscopy, flexible,                         transoral; diagnostic, including collection of                         specimen(s) by brushing or washing, when performed                         (separate procedure) Diagnosis Code(s):     --- Professional ---                        K74.60, Unspecified cirrhosis of liver CPT copyright 2022 American Medical Association. All rights reserved. The codes documented in this report are preliminary and upon coder review may  be revised to meet current compliance requirements. Wyline Mood, MD Wyline Mood MD, MD 03/01/2023 9:38:54 AM This report has been signed electronically. Number of Addenda: 0 Note Initiated On: 03/01/2023 9:26 AM Estimated Blood Loss:  Estimated blood loss: none.      [facility]

## 2023-03-01 NOTE — Anesthesia Preprocedure Evaluation (Signed)
Anesthesia Evaluation  Patient identified by MRN, date of birth, ID band Patient awake    Reviewed: Allergy & Precautions, NPO status , Patient's Chart, lab work & pertinent test results  History of Anesthesia Complications Negative for: history of anesthetic complications  Airway Mallampati: III  TM Distance: <3 FB Neck ROM: full    Dental  (+) Chipped, Poor Dentition   Pulmonary neg shortness of breath, former smoker   Pulmonary exam normal        Cardiovascular Exercise Tolerance: Good hypertension, (-) angina Normal cardiovascular exam     Neuro/Psych negative neurological ROS  negative psych ROS   GI/Hepatic negative GI ROS, Neg liver ROS,neg GERD  ,,  Endo/Other  negative endocrine ROS    Renal/GU negative Renal ROS  negative genitourinary   Musculoskeletal   Abdominal   Peds  Hematology negative hematology ROS (+)   Anesthesia Other Findings Past Medical History: 04/17/2021: Alcohol abuse No date: Alcoholic cirrhosis of liver with ascites (HCC) No date: Alpha-1-antitrypsin deficiency (HCC) No date: Cirrhosis (HCC) No date: Esophageal varices (HCC) No date: ETOH abuse No date: Hypertension 05/10/2021: Hyponatremia  Past Surgical History: 04/16/2022: CHOLECYSTECTOMY 04/15/2022: COLONOSCOPY WITH PROPOFOL; N/A     Comment:  Procedure: COLONOSCOPY WITH PROPOFOL;  Surgeon: Wyline Mood, MD;  Location: [facility] ENDOSCOPY;  Service:               Gastroenterology;  Laterality: N/A; 05/12/2021: ESOPHAGOGASTRODUODENOSCOPY (EGD) WITH PROPOFOL; N/A     Comment:  Procedure: ESOPHAGOGASTRODUODENOSCOPY (EGD) WITH               PROPOFOL;  Surgeon: Wyline Mood, MD;  Location: [facility]               ENDOSCOPY;  Service: Gastroenterology;  Laterality: N/A; 07/08/2021: ESOPHAGOGASTRODUODENOSCOPY (EGD) WITH PROPOFOL; N/A     Comment:  Procedure: ESOPHAGOGASTRODUODENOSCOPY (EGD) WITH               PROPOFOL;   Surgeon: Wyline Mood, MD;  Location: [facility]               ENDOSCOPY;  Service: Gastroenterology;  Laterality: N/A; 08/20/2021: ESOPHAGOGASTRODUODENOSCOPY (EGD) WITH PROPOFOL; N/A     Comment:  Procedure: ESOPHAGOGASTRODUODENOSCOPY (EGD) WITH               PROPOFOL;  Surgeon: Wyline Mood, MD;  Location: [facility]               ENDOSCOPY;  Service: Gastroenterology;  Laterality: N/A; 02/23/2022: ESOPHAGOGASTRODUODENOSCOPY (EGD) WITH PROPOFOL; N/A     Comment:  Procedure: ESOPHAGOGASTRODUODENOSCOPY (EGD) WITH               PROPOFOL;  Surgeon: Wyline Mood, MD;  Location: [facility]               ENDOSCOPY;  Service: Gastroenterology;  Laterality: N/A;  BMI    Body Mass Index: 29.13 kg/m      Reproductive/Obstetrics negative OB ROS                             Anesthesia Physical Anesthesia Plan  ASA: 3  Anesthesia Plan: General   Post-op Pain Management:    Induction: Intravenous  PONV Risk Score and Plan: Propofol infusion and TIVA  Airway Management Planned: Natural Airway and Nasal Cannula  Additional Equipment:   Intra-op Plan:   Post-operative Plan:   Informed Consent:  I have reviewed the patients History and Physical, chart, labs and discussed the procedure including the risks, benefits and alternatives for the proposed anesthesia with the patient or authorized representative who has indicated his/her understanding and acceptance.     Dental Advisory Given  Plan Discussed with: Anesthesiologist, CRNA and Surgeon  Anesthesia Plan Comments: (Patient consented for risks of anesthesia including but not limited to:  - adverse reactions to medications - risk of airway placement if required - damage to eyes, teeth, lips or other oral mucosa - nerve damage due to positioning  - sore throat or hoarseness - Damage to heart, brain, nerves, lungs, other parts of body or loss of life  Patient voiced understanding.)       Anesthesia Quick Evaluation

## 2023-03-01 NOTE — Transfer of Care (Signed)
Immediate Anesthesia Transfer of Care Note  Patient: Brian Vasquez  Procedure(s) Performed: ESOPHAGOGASTRODUODENOSCOPY (EGD) WITH PROPOFOL  Patient Location: Endoscopy Unit  Anesthesia Type:MAC  Level of Consciousness: drowsy and patient cooperative  Airway & Oxygen Therapy: Patient Spontanous Breathing and Patient connected to face mask  Post-op Assessment: Report given to RN, Post -op Vital signs reviewed and stable, and Patient moving all extremities X 4  Post vital signs: Reviewed and stable  Last Vitals:  Vitals Value Taken Time  BP 107/64 03/01/23 0940  Temp    Pulse 70 03/01/23 0940  Resp 16 03/01/23 0940  SpO2 99 % 03/01/23 0940    Last Pain:  Vitals:   03/01/23 0940  TempSrc:   PainSc: Asleep         Complications: No notable events documented.

## 2023-03-01 NOTE — Anesthesia Postprocedure Evaluation (Signed)
Anesthesia Post Note  Patient: Brian Vasquez  Procedure(s) Performed: ESOPHAGOGASTRODUODENOSCOPY (EGD) WITH PROPOFOL  Patient location during evaluation: Endoscopy Anesthesia Type: General Level of consciousness: awake and alert Pain management: pain level controlled Vital Signs Assessment: post-procedure vital signs reviewed and stable Respiratory status: spontaneous breathing, nonlabored ventilation, respiratory function stable and patient connected to nasal cannula oxygen Cardiovascular status: blood pressure returned to baseline and stable Postop Assessment: no apparent nausea or vomiting Anesthetic complications: no   No notable events documented.   Last Vitals:  Vitals:   03/01/23 0950 03/01/23 1000  BP: 100/69 113/67  Pulse: 73 61  Resp: 15 20  Temp:    SpO2: 95% 97%    Last Pain:  Vitals:   03/01/23 1000  TempSrc:   PainSc: 0-No pain                 [name] Brent Noto

## 2023-03-02 ENCOUNTER — Encounter: Payer: Self-pay | Admitting: Gastroenterology

## 2023-07-14 ENCOUNTER — Other Ambulatory Visit: Payer: Self-pay | Admitting: Transplant Hepatology

## 2023-07-14 DIAGNOSIS — R772 Abnormality of alphafetoprotein: Secondary | ICD-10-CM

## 2023-07-14 DIAGNOSIS — R188 Other ascites: Secondary | ICD-10-CM

## 2023-07-16 ENCOUNTER — Encounter: Payer: Self-pay | Admitting: Transplant Hepatology

## 2023-07-23 ENCOUNTER — Other Ambulatory Visit: Payer: Self-pay | Admitting: Gastroenterology

## 2023-07-29 ENCOUNTER — Ambulatory Visit
Admission: RE | Admit: 2023-07-29 | Discharge: 2023-07-29 | Disposition: A | Discharge status: Home / Self Care | Payer: 59 | Source: Ambulatory Visit | Attending: Transplant Hepatology | Admitting: Transplant Hepatology

## 2023-07-29 DIAGNOSIS — K7460 Unspecified cirrhosis of liver: Secondary | ICD-10-CM

## 2023-07-29 DIAGNOSIS — R772 Abnormality of alphafetoprotein: Secondary | ICD-10-CM

## 2023-07-29 MED ORDER — GADOPICLENOL 0.5 MMOL/ML IV SOLN
10.0000 mL | Freq: Once | INTRAVENOUS | Status: AC | PRN
  Administered 2023-07-29: 10 mL via INTRAVENOUS

## 2023-10-06 ENCOUNTER — Other Ambulatory Visit: Payer: Self-pay | Admitting: Transplant Hepatology

## 2023-10-06 DIAGNOSIS — R160 Hepatomegaly, not elsewhere classified: Secondary | ICD-10-CM

## 2023-10-21 ENCOUNTER — Ambulatory Visit
Admission: RE | Admit: 2023-10-21 | Discharge: 2023-10-21 | Disposition: A | Discharge status: Home / Self Care | Source: Ambulatory Visit | Attending: Transplant Hepatology | Admitting: Transplant Hepatology

## 2023-10-21 DIAGNOSIS — R160 Hepatomegaly, not elsewhere classified: Secondary | ICD-10-CM

## 2023-10-21 MED ORDER — GADOPICLENOL 0.5 MMOL/ML IV SOLN
10.0000 mL | Freq: Once | INTRAVENOUS | Status: AC | PRN
  Administered 2023-10-21: 10 mL via INTRAVENOUS

## 2023-10-22 ENCOUNTER — Encounter: Payer: Self-pay | Admitting: Gastroenterology

## 2023-11-22 ENCOUNTER — Telehealth: Admitting: Gastroenterology

## 2023-11-22 DIAGNOSIS — K7030 Alcoholic cirrhosis of liver without ascites: Secondary | ICD-10-CM

## 2023-11-22 DIAGNOSIS — R748 Abnormal levels of other serum enzymes: Secondary | ICD-10-CM | POA: Diagnosis not present

## 2023-11-22 DIAGNOSIS — K7460 Unspecified cirrhosis of liver: Secondary | ICD-10-CM

## 2023-11-22 DIAGNOSIS — F1091 Alcohol use, unspecified, in remission: Secondary | ICD-10-CM | POA: Diagnosis not present

## 2023-11-22 DIAGNOSIS — E8801 Alpha-1-antitrypsin deficiency: Secondary | ICD-10-CM

## 2023-11-22 NOTE — Progress Notes (Signed)
 Brian Vasquez , MD [address]  [address]  [place], [place] [address]  Main: [phone]  Fax: [phone]   Primary Care Physician: Morton Areas, PA-C  Virtual Visit via Video Note  I connected with patient on 11/22/23 at 10:15 AM EDT by video and verified that I am speaking with the correct person using two identifiers.   I discussed the limitations, risks, security and privacy concerns of performing an evaluation and management service by video  and the availability of in person appointments. I also discussed with the patient that there may be a patient responsible charge related to this service. The patient expressed understanding and agreed to proceed.  Location of Patient: Home Location of Provider: Home Persons involved: Patient and provider only   History of Present Illness: No chief complaint on file.   HPI: Brian Vasquez is a 48 y.o. 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 since November 2022, concern for masslike lesion on ultrasound but no mass seen on MRI AFP elevated follow-up with Dr. Mason Sole at [facility].  Biopsy was benign.   I initially met him in the hospital when he was admitted in October 2022 when he presented with ascites.ultrasound which showed cirrhosis with a 4.9 cm masslike lesion in the caudate lobe.  He underwent an MRI of the liver without contrast and with contrast to look at the lesion and showed no concern for malignancy simply noted focal nodular area of the liver.  AFP which was elevated at 79.5.  Ascetic fluid likely was due to portal hypertension.  INR was 1.7.  Hemoglobin 13.4.  Hepatitis B core antibody was positive, hepatitis C antibody , hepatitis B surface antibody was negative .    05/12/2021: EGD:  3 bands placed over non  bleeding large esophageal varices. Duodenal bx shows no evidence of celiac disease.   07/11/2021: Liver biopsy shows features of alpha-1 antitrypsin deficiency, chronic cirrhotic liver with inflammatory activity no dysplastic nodule or carcinoma seen. 08/28/2021 was seen by [facility] for alpha-1 antitrypsin deficiency 02/23/2022: EGD: Portal hypertensive gastropathy no esophageal varices repeat EGD in 1 year 04/15/2022: Colonoscopy: Two 4 to 5 mm polyps in the ascending colon removed with a cold snare.  There were hyperplastic  10/12/2023diagnosed with hemorrhagic cholecystitis s/p laparoscopic cholecystectomy with Dr. Dana Duncan.  Developed ascites commenced on 40 mg of Lasix  a day.  Path report showed acute on chronic cholecystitis without cholelithiasis.   05/04/2022: Seen Dr. 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 Monte Antonio at [facility]: Lasix  drop to 20 mg a day and eplerenone  to 50 mg a day.       Interval history 02/01/2023- 11/22/2023  02/09/2023: EGD: No varices repeatEGD in 02/2024 10/27/2023 MRI liver scattered subcentimeter foci of arterial hyperenhancement repeat MRI in 6 months cirrhosis with portal hypertension regenerative nodules noted. 10/25/2023 AFP 85 down from 135. 10/25/2023 globin 17.7 platelet count 211 total bilirubin 1.3 albumin  4.6 creatinine 0.91, prothrombin time 12.8 INR 1.2   He is doing well no complaints weight has been stable.  On low-salt diet denies any NSAID use  no mental changes On eplerenone  50 mg a day , lasix  20 mg .      Current Outpatient Medications  Medication Sig Dispense Refill   eplerenone  (INSPRA ) 50 MG tablet TAKE 2 TABLETS BY MOUTH EVERY DAY 180 tablet 1   folic acid  (FOLVITE ) 1 MG tablet Take 1 tablet by mouth daily.      furosemide  (LASIX ) 20 MG tablet Take 2 tablets (40 mg total) by mouth daily. (Patient taking differently: Take 20 mg by mouth daily.) 90 tablet 3   thiamine  100 MG tablet Take 1 tablet (100 mg total) by mouth daily.     Current Facility-Administered Medications  Medication Dose Route Frequency Provider Last Rate Last Admin   albumin  human 25 % solution 25 g  25 g Intravenous Once Brian Adrian, MD        Allergies as of 11/22/2023   (No Known Allergies)    Review of Systems:    All systems reviewed and negative except where noted in HPI.  General Appearance:    Alert, cooperative, no distress, appears stated age  Head:    Normocephalic, without obvious abnormality, atraumatic  Eyes:    PERRL, conjunctiva/corneas clear,  Ears:    Grossly normal hearing    Neurologic:  Grossly normal    Observations/Objective:  Labs: CMP     Component Value Date/Time   NA 143 01/06/2023 1341   K 3.9 01/06/2023 1341   CL 105 01/06/2023 1341   CO2 23 01/06/2023 1341   GLUCOSE 126 (H) 01/06/2023 1341   GLUCOSE 120 (H) 05/04/2022 0523   BUN 11 01/06/2023 1341   CREATININE 0.78 01/06/2023 1341   CALCIUM 9.0 01/06/2023 1341   PROT 6.8 01/06/2023 1341   ALBUMIN  4.2 01/06/2023 1341   AST 48 (H) 01/06/2023 1341   ALT 51 (H) 01/06/2023 1341   ALKPHOS 131 (H) 01/06/2023 1341   BILITOT 1.2 01/06/2023 1341   GFRNONAA >60 05/04/2022 0523   Lab Results  Component Value Date   WBC 5.9 11/20/2022   HGB 15.5 11/20/2022   HCT 44.9 11/20/2022   MCV 87 11/20/2022   PLT 106 (L) 11/20/2022    Imaging Studies: No results found.  Assessment and Plan:   Brian Vasquez is a 48 y.o. y/o male  with a history of alcoholic liver and alpha 1 antitrypsin deficiency related cirrhosis, decompensation with ascites presented to the ER in October 2022 with abdominal distention and acute liver failure, non bleeding esophageal varices that have been banded.  05/25/2022 MELD score of 10   Concern for masslike lesion  on ultrasound but no mass seen on MRI but AFP has been elevated, follow-up with Dr. Mason Sole at [facility] biopsy was benign. Did not tolerate Aldactone  as he developed gynecomastia,positive tissue transglutaminase antibody but negative biopsies of the duodenum for celiac disease.  Tests for autoimmune liver disease was negative.  Elevated ferritin, very low alpha-1 antitrypsin levels, persistently elevated AFP, no recent episodes of hepatic encephalopathy.   Follows with Dr Mason Sole at [facility] 1.  No NSAIDs, check labs in 01/2024 2.  On eplerenone  50 mg and Lasix  20 mg a day continue  4.  Low-sodium diet very compliant 5.  Elevated AFP: MRI liver shows stable liver mass repeat in 04/2024 with Dr Mason Sole at [facility] 6.  EGD for surveillance of esophageal varices August 2025  7.  Continue to stay off all alcohol: Has been in remission for many months  8.  He has completed hepatitis A  and B vaccination including pneumococcal vaccination at [facility].  He is due for Tdap advised him to discuss with his primary care provider. 9.  He has been very compliant with a low-sodium diet and there is no evidence of ascites on MRI recently his synthetic function has been good.  I asked him to discuss with Dr. Mason Sole if he could have a trial of going off all diuretics and closely monitoring his weight.  I believe he may not require it anymore as he has not had any ascites for over 2 years, weight has been stable.  10.  Follow-up with me in mid July at [facility]      I discussed the assessment and treatment plan with the patient. The patient was provided an opportunity to ask questions and all were answered. The patient agreed with the plan and demonstrated an understanding of the instructions.   The patient was advised to call back or seek an in-person evaluation if the symptoms worsen or if the condition fails to improve as anticipated.  I provided 15 minutes of face-to-face time during this encounter.  Dr Brian Salaam MD,MRCP  [facility]) Gastroenterology/Hepatology Pager: [phone]   Speech recognition software was used to dictate this note.

## 2024-01-13 ENCOUNTER — Other Ambulatory Visit: Payer: Self-pay | Admitting: Transplant Hepatology

## 2024-01-13 DIAGNOSIS — R160 Hepatomegaly, not elsewhere classified: Secondary | ICD-10-CM

## 2024-05-04 ENCOUNTER — Ambulatory Visit
Admission: RE | Admit: 2024-05-04 | Discharge: 2024-05-04 | Disposition: A | Discharge status: Home / Self Care | Attending: Gastroenterology | Admitting: Gastroenterology

## 2024-05-04 ENCOUNTER — Ambulatory Visit: Admitting: Certified Registered"

## 2024-05-04 ENCOUNTER — Encounter: Admission: RE | Disposition: A | Payer: Self-pay | Source: Home / Self Care | Attending: Gastroenterology

## 2024-05-04 ENCOUNTER — Encounter: Payer: Self-pay | Admitting: Gastroenterology

## 2024-05-04 DIAGNOSIS — R188 Other ascites: Secondary | ICD-10-CM | POA: Insufficient documentation

## 2024-05-04 DIAGNOSIS — I10 Essential (primary) hypertension: Secondary | ICD-10-CM | POA: Insufficient documentation

## 2024-05-04 DIAGNOSIS — Z87891 Personal history of nicotine dependence: Secondary | ICD-10-CM | POA: Insufficient documentation

## 2024-05-04 DIAGNOSIS — K7460 Unspecified cirrhosis of liver: Secondary | ICD-10-CM | POA: Insufficient documentation

## 2024-05-04 DIAGNOSIS — I8510 Secondary esophageal varices without bleeding: Secondary | ICD-10-CM | POA: Diagnosis present

## 2024-05-04 HISTORY — PX: ESOPHAGOGASTRODUODENOSCOPY: SHX5428

## 2024-05-04 SURGERY — EGD (ESOPHAGOGASTRODUODENOSCOPY)
Anesthesia: General

## 2024-05-04 MED ORDER — LIDOCAINE HCL (CARDIAC) PF 100 MG/5ML IV SOSY
PREFILLED_SYRINGE | INTRAVENOUS | Status: DC | PRN
  Administered 2024-05-04: 100 mg via INTRAVENOUS

## 2024-05-04 MED ORDER — PROPOFOL 500 MG/50ML IV EMUL
INTRAVENOUS | Status: DC | PRN
Start: 2024-05-04 — End: 2024-05-04
  Administered 2024-05-04: 150 mg via INTRAVENOUS
  Administered 2024-05-04: 20 mg via INTRAVENOUS

## 2024-05-04 MED ORDER — PROPOFOL 10 MG/ML IV BOLUS
INTRAVENOUS | Status: AC
  Filled 2024-05-04: qty 40

## 2024-05-04 MED ORDER — LIDOCAINE HCL (PF) 2 % IJ SOLN
INTRAMUSCULAR | Status: AC
  Filled 2024-05-04: qty 5

## 2024-05-04 MED ORDER — SODIUM CHLORIDE 0.9 % IV SOLN: INTRAVENOUS | Status: DC

## 2024-05-04 NOTE — H&P (Signed)
 Ruel Kung , MD [address], [place], [place], [address] Phone: [phone] Fax: [phone]  Primary Care Physician:  Franchot Houston, PA-C   Pre-Procedure History & Physical: HPI:  Kaicen Desena is a 48 y.o. male is here for an endoscopy    Past Medical History:  Diagnosis Date   Alcohol abuse 04/17/2021   Alcoholic cirrhosis of liver with ascites (HCC)    Alpha-1-antitrypsin deficiency (HCC)    Cirrhosis (HCC)    Esophageal varices (HCC)    ETOH abuse    Hypertension    Hyponatremia 05/10/2021    Past Surgical History:  Procedure Laterality Date   CHOLECYSTECTOMY  04/16/2022   COLONOSCOPY WITH PROPOFOL  N/A 04/15/2022   Procedure: COLONOSCOPY WITH PROPOFOL ;  Surgeon: Kung Ruel, MD;  Location: [facility] ENDOSCOPY;  Service: Gastroenterology;  Laterality: N/A;   ESOPHAGOGASTRODUODENOSCOPY (EGD) WITH PROPOFOL  N/A 05/12/2021   Procedure: ESOPHAGOGASTRODUODENOSCOPY (EGD) WITH PROPOFOL ;  Surgeon: Kung Ruel, MD;  Location: [facility] ENDOSCOPY;  Service: Gastroenterology;  Laterality: N/A;   ESOPHAGOGASTRODUODENOSCOPY (EGD) WITH PROPOFOL  N/A 07/08/2021   Procedure: ESOPHAGOGASTRODUODENOSCOPY (EGD) WITH PROPOFOL ;  Surgeon: Kung Ruel, MD;  Location: [facility] ENDOSCOPY;  Service: Gastroenterology;  Laterality: N/A;   ESOPHAGOGASTRODUODENOSCOPY (EGD) WITH PROPOFOL  N/A 08/20/2021   Procedure: ESOPHAGOGASTRODUODENOSCOPY (EGD) WITH PROPOFOL ;  Surgeon: Kung Ruel, MD;  Location: [facility] ENDOSCOPY;  Service: Gastroenterology;  Laterality: N/A;   ESOPHAGOGASTRODUODENOSCOPY (EGD) WITH PROPOFOL  N/A 02/23/2022   Procedure: ESOPHAGOGASTRODUODENOSCOPY (EGD) WITH PROPOFOL ;  Surgeon: Kung Ruel, MD;  Location: [facility] ENDOSCOPY;  Service: Gastroenterology;  Laterality: N/A;   ESOPHAGOGASTRODUODENOSCOPY (EGD) WITH PROPOFOL  N/A 03/01/2023   Procedure: ESOPHAGOGASTRODUODENOSCOPY (EGD) WITH PROPOFOL ;  Surgeon: Kung Ruel, MD;  Location: [facility] ENDOSCOPY;  Service: Gastroenterology;  Laterality:  N/A;    Prior to Admission medications   Medication Sig Start Date End Date Taking? Authorizing Provider  eplerenone  (INSPRA ) 50 MG tablet TAKE 2 TABLETS BY MOUTH EVERY DAY 07/26/23   Kung Ruel, MD  folic acid  (FOLVITE ) 1 MG tablet Take 1 tablet by mouth daily.    [provider]  furosemide  (LASIX ) 20 MG tablet Take 2 tablets (40 mg total) by mouth daily. Patient taking differently: Take 20 mg by mouth daily. 12/23/22   Kung Ruel, MD  thiamine  100 MG tablet Take 1 tablet (100 mg total) by mouth daily. 04/16/21   Danton Reyes DASEN, MD    Allergies as of 04/15/2024   (No Known Allergies)    No family history on file.  Social History   Socioeconomic History   Marital status: Divorced    Spouse name: Not on file   Number of children: Not on file   Years of education: Not on file   Highest education level: Not on file  Occupational History   Not on file  Tobacco Use   Smoking status: Former    Types: Cigarettes    Passive exposure: Past   Smokeless tobacco: Never  Vaping Use   Vaping status: Never Used  Substance and Sexual Activity   Alcohol use: Not Currently   Drug use: Not Currently   Sexual activity: Not on file  Other Topics Concern   Not on file  Social History Narrative   Not on file   Social Drivers of Health   Financial Resource Strain: Low Risk  (04/11/2024)   Received from [facility]   Overall Financial Resource Strain (CARDIA)    Difficulty of Paying Living Expenses: Not very hard  Food Insecurity: No Food Insecurity (04/11/2024)   Received  from [facility]   Hunger Vital Sign    Within the past 12 months, you worried that your food would run out before you got the money to buy more.: Never true    Within the past 12 months, the food you bought just didn't last and you didn't have money to get more.: Never true  Transportation Needs: No Transportation Needs (04/11/2024)   Received from [facility]    In the past 12 months, has lack of transportation kept you from medical appointments or from getting medications?: No    Lack of Transportation (Non-Medical): No  Physical Activity: Not on file  Stress: Not on file  Social Connections: Not on file  Intimate Partner Violence: Not At Risk (04/17/2022)   Humiliation, Afraid, Rape, and Kick questionnaire    Fear of Current or Ex-Partner: No    Emotionally Abused: No    Physically Abused: No    Sexually Abused: No    Review of Systems: See HPI, otherwise negative ROS  Physical Exam: There were no vitals taken for this visit. General:   Alert,  pleasant and cooperative in NAD Head:  Normocephalic and atraumatic. Neck:  Supple; no masses or thyromegaly. Lungs:  Clear throughout to auscultation, normal respiratory effort.    Heart:  +S1, +S2, Regular rate and rhythm, No edema. Abdomen:  Soft, nontender and nondistended. Normal bowel sounds, without guarding, and without rebound.   Neurologic:  Alert and  oriented x4;  grossly normal neurologically.  Impression/Plan: Malin Cervini is here for an endoscopy  to be performed for  evaluation of esophageal varices    Risks, benefits, limitations, and alternatives regarding endoscopy have been reviewed with the patient.  Questions have been answered.  All parties agreeable.   Ruel Kung, MD  05/04/2024, 7:46 AM

## 2024-05-04 NOTE — Transfer of Care (Signed)
 Immediate Anesthesia Transfer of Care Note  Patient: Brian Vasquez  Procedure(s) Performed: EGD (ESOPHAGOGASTRODUODENOSCOPY)  Patient Location: Endoscopy Unit  Anesthesia Type:General  Level of Consciousness: awake and drowsy  Airway & Oxygen Therapy: Patient Spontanous Breathing  Post-op Assessment: Report given to RN and Post -op Vital signs reviewed and stable  Post vital signs: Reviewed and stable  Last Vitals:  Vitals Value Taken Time  BP 126/77 0828  Temp 35.8 0828  Pulse 72 0828  Resp 16 0828  SpO2 97 0828    Last Pain:  Vitals:   05/04/24 0757  TempSrc: Temporal         Complications: No notable events documented.

## 2024-05-04 NOTE — Anesthesia Preprocedure Evaluation (Addendum)
 Anesthesia Evaluation  Patient identified by MRN, date of birth, ID band Patient awake    Reviewed: Allergy & Precautions, NPO status , Patient's Chart, lab work & pertinent test results  Airway Mallampati: II  TM Distance: >3 FB Neck ROM: full    Dental  (+) Teeth Intact   Pulmonary neg pulmonary ROS   Pulmonary exam normal        Cardiovascular Exercise Tolerance: Good hypertension, Pt. on medications Normal cardiovascular exam Rhythm:Regular Rate:Normal     Neuro/Psych negative neurological ROS  negative psych ROS   GI/Hepatic negative GI ROS,,,(+) Cirrhosis   Esophageal Varices and ascites  substance abuse  alcohol use  Endo/Other  negative endocrine ROS    Renal/GU negative Renal ROS  negative genitourinary   Musculoskeletal   Abdominal Normal abdominal exam  (+)   Peds negative pediatric ROS (+)  Hematology negative hematology ROS (+)   Anesthesia Other Findings Past Medical History: 04/17/2021: Alcohol abuse No date: Alcoholic cirrhosis of liver with ascites (HCC) No date: Alpha-1-antitrypsin deficiency (HCC) No date: Cirrhosis (HCC) No date: Esophageal varices (HCC) No date: ETOH abuse No date: Hypertension 05/10/2021: Hyponatremia  Past Surgical History: 04/16/2022: CHOLECYSTECTOMY 04/15/2022: COLONOSCOPY WITH PROPOFOL ; N/A     Comment:  Procedure: COLONOSCOPY WITH PROPOFOL ;  Surgeon: Therisa Bi, MD;  Location: [facility] ENDOSCOPY;  Service:               Gastroenterology;  Laterality: N/A; 05/12/2021: ESOPHAGOGASTRODUODENOSCOPY (EGD) WITH PROPOFOL ; N/A     Comment:  Procedure: ESOPHAGOGASTRODUODENOSCOPY (EGD) WITH               PROPOFOL ;  Surgeon: Therisa Bi, MD;  Location: [facility]               ENDOSCOPY;  Service: Gastroenterology;  Laterality: N/A; 07/08/2021: ESOPHAGOGASTRODUODENOSCOPY (EGD) WITH PROPOFOL ; N/A     Comment:  Procedure: ESOPHAGOGASTRODUODENOSCOPY (EGD) WITH                PROPOFOL ;  Surgeon: Therisa Bi, MD;  Location: [facility]               ENDOSCOPY;  Service: Gastroenterology;  Laterality: N/A; 08/20/2021: ESOPHAGOGASTRODUODENOSCOPY (EGD) WITH PROPOFOL ; N/A     Comment:  Procedure: ESOPHAGOGASTRODUODENOSCOPY (EGD) WITH               PROPOFOL ;  Surgeon: Therisa Bi, MD;  Location: [facility]               ENDOSCOPY;  Service: Gastroenterology;  Laterality: N/A; 02/23/2022: ESOPHAGOGASTRODUODENOSCOPY (EGD) WITH PROPOFOL ; N/A     Comment:  Procedure: ESOPHAGOGASTRODUODENOSCOPY (EGD) WITH               PROPOFOL ;  Surgeon: Therisa Bi, MD;  Location: [facility]               ENDOSCOPY;  Service: Gastroenterology;  Laterality: N/A; 03/01/2023: ESOPHAGOGASTRODUODENOSCOPY (EGD) WITH PROPOFOL ; N/A     Comment:  Procedure: ESOPHAGOGASTRODUODENOSCOPY (EGD) WITH               PROPOFOL ;  Surgeon: Therisa Bi, MD;  Location: [facility]               ENDOSCOPY;  Service: Gastroenterology;  Laterality: N/A;  BMI    Body Mass Index: 29.53 kg/m      Reproductive/Obstetrics negative OB ROS  Anesthesia Physical Anesthesia Plan  ASA: 2  Anesthesia Plan: General   Post-op Pain Management:    Induction: Intravenous  PONV Risk Score and Plan: Propofol  infusion and TIVA  Airway Management Planned: Natural Airway and Nasal Cannula  Additional Equipment:   Intra-op Plan:   Post-operative Plan:   Informed Consent: I have reviewed the patients History and Physical, chart, labs and discussed the procedure including the risks, benefits and alternatives for the proposed anesthesia with the patient or authorized representative who has indicated his/her understanding and acceptance.     Dental Advisory Given  Plan Discussed with: CRNA  Anesthesia Plan Comments:          Anesthesia Quick Evaluation

## 2024-05-04 NOTE — Op Note (Signed)
 [facility] Gastroenterology Patient Name: Brian Vasquez Procedure Date: 05/04/2024 8:12 AM MRN: [id number] Account #: [id number] Date of Birth: 11/23/1975 Admit Type: Outpatient Age: 48 Room: [facility] ENDO ROOM 3 Gender: Male Note Status: Finalized Instrument Name: Upper GI Scope [id number] Procedure:             Upper GI endoscopy Indications:           Cirrhosis rule out esophageal varices Providers:             Ruel Kung MD, MD Referring MD:          Edsel Pepper (Referring MD) Medicines:             Monitored Anesthesia Care Complications:         No immediate complications. Procedure:             Pre-Anesthesia Assessment:                        - Prior to the procedure, a History and Physical was                         performed, and patient medications, allergies and                         sensitivities were reviewed. The patient's tolerance                         of previous anesthesia was reviewed.                        - The risks and benefits of the procedure and the                         sedation options and risks were discussed with the                         patient. All questions were answered and informed                         consent was obtained.                        - ASA Grade Assessment: II - A patient with mild                         systemic disease.                        After obtaining informed consent, the endoscope was                         passed under direct vision. Throughout the procedure,                         the patient's blood pressure, pulse, and oxygen                         saturations were monitored continuously. The Endoscope  was introduced through the mouth, and advanced to the                         third part of duodenum. The upper GI endoscopy was                         accomplished with ease. The patient tolerated the                         procedure well. Findings:       The esophagus was normal.      The stomach was normal.      The examined duodenum was normal. Impression:            - Normal esophagus.                        - Normal stomach.                        - Normal examined duodenum.                        - No specimens collected. Recommendation:        - Discharge patient to home (with escort).                        - Resume previous diet.                        - Continue present medications.                        - Repeat upper endoscopy in 1 year for surveillance. Procedure Code(s):     --- Professional ---                        [phone], Esophagogastroduodenoscopy, flexible,                         transoral; diagnostic, including collection of                         specimen(s) by brushing or washing, when performed                         (separate procedure) Diagnosis Code(s):     --- Professional ---                        K74.60, Unspecified cirrhosis of liver CPT copyright 2022 American Medical Association. All rights reserved. The codes documented in this report are preliminary and upon coder review may  be revised to meet current compliance requirements. Ruel Kung, MD Ruel Kung MD, MD 05/04/2024 8:25:21 AM This report has been signed electronically. Number of Addenda: 0 Note Initiated On: 05/04/2024 8:12 AM Estimated Blood Loss:  Estimated blood loss: none.      [facility]

## 2024-05-04 NOTE — Anesthesia Postprocedure Evaluation (Signed)
 Anesthesia Post Note  Patient: Brian Vasquez  Procedure(s) Performed: EGD (ESOPHAGOGASTRODUODENOSCOPY)  Patient location during evaluation: PACU Anesthesia Type: General Level of consciousness: awake Pain management: satisfactory to patient Vital Signs Assessment: post-procedure vital signs reviewed and stable Respiratory status: spontaneous breathing Cardiovascular status: stable Anesthetic complications: no   No notable events documented.   Last Vitals:  Vitals:   05/04/24 0838 05/04/24 0848  BP: 117/85 129/84  Pulse: 72 67  Resp: 16 (!) 22  Temp:    SpO2: 100% 100%    Last Pain:  Vitals:   05/04/24 0848  TempSrc:   PainSc: 0-No pain                 VAN STAVEREN,Chaunice Obie
